# Patient Record
Sex: Male | Born: 1955 | Race: White | Hispanic: Yes | Marital: Single | Smoking: Former smoker
Health system: Southern US, Community
[De-identification: ages and names within clinical notes are randomized; demographics above are authoritative.]

## PROBLEM LIST (undated history)

## (undated) DIAGNOSIS — E669 Obesity, unspecified: Secondary | ICD-10-CM

## (undated) DIAGNOSIS — T7840XA Allergy, unspecified, initial encounter: Secondary | ICD-10-CM

## (undated) DIAGNOSIS — E1169 Type 2 diabetes mellitus with other specified complication: Secondary | ICD-10-CM

## (undated) HISTORY — DX: Allergy, unspecified, initial encounter: T78.40XA

## (undated) HISTORY — PX: FRACTURE SURGERY: SHX138

---

## 2004-08-29 ENCOUNTER — Emergency Department (HOSPITAL_COMMUNITY): Admission: EM | Admit: 2004-08-29 | Discharge: 2004-08-29 | Payer: Self-pay | Admitting: Emergency Medicine

## 2007-12-22 ENCOUNTER — Ambulatory Visit: Payer: Self-pay | Admitting: Nurse Practitioner

## 2007-12-22 DIAGNOSIS — R030 Elevated blood-pressure reading, without diagnosis of hypertension: Secondary | ICD-10-CM

## 2007-12-22 DIAGNOSIS — J309 Allergic rhinitis, unspecified: Secondary | ICD-10-CM | POA: Insufficient documentation

## 2007-12-22 LAB — CONVERTED CEMR LAB
Bilirubin Urine: NEGATIVE
Glucose, Urine, Semiquant: NEGATIVE
Protein, U semiquant: NEGATIVE
pH: 6

## 2007-12-27 ENCOUNTER — Encounter (INDEPENDENT_AMBULATORY_CARE_PROVIDER_SITE_OTHER): Payer: Self-pay | Admitting: Nurse Practitioner

## 2007-12-27 DIAGNOSIS — E7800 Pure hypercholesterolemia, unspecified: Secondary | ICD-10-CM | POA: Insufficient documentation

## 2007-12-27 LAB — CONVERTED CEMR LAB
Albumin: 4.6 g/dL (ref 3.5–5.2)
CO2: 26 meq/L (ref 19–32)
Calcium: 9.5 mg/dL (ref 8.4–10.5)
Chloride: 99 meq/L (ref 96–112)
Cholesterol: 281 mg/dL — ABNORMAL HIGH (ref 0–200)
Eosinophils Relative: 4 % (ref 0–5)
Glucose, Bld: 99 mg/dL (ref 70–99)
HCT: 48 % (ref 39.0–52.0)
Lymphocytes Relative: 46 % (ref 12–46)
Lymphs Abs: 2.3 10*3/uL (ref 0.7–4.0)
Microalb, Ur: 0.48 mg/dL (ref 0.00–1.89)
Neutrophils Relative %: 41 % — ABNORMAL LOW (ref 43–77)
Platelets: 265 10*3/uL (ref 150–400)
Potassium: 4.9 meq/L (ref 3.5–5.3)
Sodium: 141 meq/L (ref 135–145)
Total Protein: 7.4 g/dL (ref 6.0–8.3)
Triglycerides: 215 mg/dL — ABNORMAL HIGH (ref <150)
WBC: 5 10*3/uL (ref 4.0–10.5)

## 2008-01-06 ENCOUNTER — Ambulatory Visit: Payer: Self-pay | Admitting: Nurse Practitioner

## 2014-12-12 ENCOUNTER — Ambulatory Visit (INDEPENDENT_AMBULATORY_CARE_PROVIDER_SITE_OTHER): Payer: Self-pay | Admitting: Family Medicine

## 2014-12-12 VITALS — BP 120/76 | HR 65 | Temp 98.3°F | Resp 18 | Ht 66.0 in | Wt 205.4 lb

## 2014-12-12 DIAGNOSIS — R351 Nocturia: Secondary | ICD-10-CM

## 2014-12-12 DIAGNOSIS — R350 Frequency of micturition: Secondary | ICD-10-CM

## 2014-12-12 DIAGNOSIS — J069 Acute upper respiratory infection, unspecified: Secondary | ICD-10-CM

## 2014-12-12 DIAGNOSIS — B9789 Other viral agents as the cause of diseases classified elsewhere: Secondary | ICD-10-CM

## 2014-12-12 LAB — POC MICROSCOPIC URINALYSIS (UMFC): MUCUS RE: ABSENT

## 2014-12-12 LAB — POCT CBC
GRANULOCYTE PERCENT: 48.9 % (ref 37–80)
HEMATOCRIT: 43.9 % (ref 43.5–53.7)
Hemoglobin: 14.8 g/dL (ref 14.1–18.1)
Lymph, poc: 2.2 (ref 0.6–3.4)
MCH, POC: 30.5 pg (ref 27–31.2)
MCHC: 33.8 g/dL (ref 31.8–35.4)
MCV: 90.3 fL (ref 80–97)
MID (cbc): 0.4 (ref 0–0.9)
MPV: 7.2 fL (ref 0–99.8)
POC GRANULOCYTE: 2.5 (ref 2–6.9)
POC LYMPH %: 43.9 % (ref 10–50)
POC MID %: 7.2 % (ref 0–12)
Platelet Count, POC: 446 10*3/uL — AB (ref 142–424)
RBC: 4.86 M/uL (ref 4.69–6.13)
RDW, POC: 13.1 %
WBC: 5.1 10*3/uL (ref 4.6–10.2)

## 2014-12-12 LAB — POCT URINALYSIS DIP (MANUAL ENTRY)
BILIRUBIN UA: NEGATIVE
BILIRUBIN UA: NEGATIVE
Blood, UA: NEGATIVE
GLUCOSE UA: NEGATIVE
Leukocytes, UA: NEGATIVE
Nitrite, UA: NEGATIVE
Protein Ur, POC: NEGATIVE
SPEC GRAV UA: 1.01
Urobilinogen, UA: 0.2
pH, UA: 5

## 2014-12-12 LAB — GLUCOSE, POCT (MANUAL RESULT ENTRY): POC GLUCOSE: 119 mg/dL — AB (ref 70–99)

## 2014-12-12 NOTE — Progress Notes (Deleted)
Subjective:    Patient ID: Thomas Gill, male    DOB: 09-05-1955, 59 y.o.   MRN: [id number] This chart was scribed for Norberto SorensonEva Shaw, MD by Littie Deedsichard Sun, Medical Scribe. This patient was seen in Room 11 and the patient's care was started at 12:11 PM.   Chief Complaint  Patient presents with   Fever     100.6 last week     HPI HPI Comments: Thomas Gill is a 59 y.o. male who presents to the [facility] complaining of gradual onset fever of 100.6 F that started 8 days ago. Patient reports having associated nasal congestion and dry cough that developed yesterday. He has been eating, drinking, and sleeping without problems. He took a course of antibiotics (every 8 hours in the first 5 days) and has also been taking ibuprofen and acetaminophen. Overall, his symptoms have improved but he has some concern because he has been having symptoms for 8 days. Patient denies sore throat, chest pain, SOB, and wheezing. He admits to smoking 1 cigarette a day.  Patient also reports having some urinary frequency and nocturia that started 2 years ago, but worsened recently. He is concerned he may have some prostate problems.  Past Medical History  Diagnosis Date   Allergy    Past Surgical History  Procedure Laterality Date   Fracture surgery     No current outpatient prescriptions on file prior to visit.   No current facility-administered medications on file prior to visit.   No Known Allergies History reviewed. No pertinent family history. Social History   Social History   Marital Status: Single    Spouse Name: N/A   Number of Children: N/A   Years of Education: N/A   Social History Main Topics   Smoking status: Current Every Day Smoker -- 1.00 packs/day    Types: Cigarettes   Smokeless tobacco: None   Alcohol Use: No   Drug Use: No   Sexual Activity: Not Asked   Other Topics Concern   None   Social History Narrative   None    Review of Systems    Constitutional: Positive for fever. Negative for appetite change.  HENT: Negative for sore throat.   Respiratory: Positive for cough. Negative for shortness of breath and wheezing.   Cardiovascular: Negative for chest pain.  Genitourinary: Positive for frequency.  Psychiatric/Behavioral: Negative for sleep disturbance.       Objective:  BP 120/76 mmHg   Pulse 65   Temp(Src) 98.3 F (36.8 C) (Oral)   Resp 18   Ht 5\' 6"  (1.676 m)   Wt 205 lb 6.4 oz (93.169 kg)   BMI 33.17 kg/m2   SpO2 97%  Physical Exam  Constitutional: He is oriented to person, place, and time. He appears well-developed and well-nourished. No distress.  HENT:  Head: Normocephalic and atraumatic.  Right Ear: Tympanic membrane is injected and erythematous. A middle ear effusion is present.  Left Ear: Tympanic membrane is injected and erythematous. A middle ear effusion is present.  Nose: Mucosal edema and rhinorrhea present.  Mouth/Throat: Oropharynx is clear and moist. No oropharyngeal exudate, posterior oropharyngeal edema or posterior oropharyngeal erythema.  Nasal erythema, edema, and rhinorrhea.  Eyes: Pupils are equal, round, and reactive to light.  Neck: Neck supple. No thyromegaly present.  Thyroid normal.  Cardiovascular: Normal rate, regular rhythm, S1 normal, S2 normal and normal heart sounds.   No murmur heard. Pulmonary/Chest: Effort normal.  Good air movement. Coarsening of breath sounds  and faint inspiratory wheezing in right lobe, clears with inspiration.  Musculoskeletal: He exhibits no edema.  Lymphadenopathy:    He has no cervical adenopathy.       Right cervical: No posterior cervical adenopathy present.      Left cervical: No posterior cervical adenopathy present.       Right: No supraclavicular adenopathy present.       Left: No supraclavicular adenopathy present.  Neurological: He is alert and oriented to person, place, and time. No cranial nerve deficit.  Skin: Skin is warm and dry. No rash  noted.  Psychiatric: He has a normal mood and affect. His behavior is normal.  Nursing note and vitals reviewed.  Results for orders placed or performed in visit on 12/12/14  POCT CBC  Result Value Ref Range   WBC 5.1 4.6 - 10.2 K/uL   Lymph, poc 2.2 0.6 - 3.4   POC LYMPH PERCENT 43.9 10 - 50 %L   MID (cbc) 0.4 0 - 0.9   POC MID % 7.2 0 - 12 %M   POC Granulocyte 2.5 2 - 6.9   Granulocyte percent 48.9 37 - 80 %G   RBC 4.86 4.69 - 6.13 M/uL   Hemoglobin 14.8 14.1 - 18.1 g/dL   HCT, POC 14.7 82.9 - 53.7 %   MCV 90.3 80 - 97 fL   MCH, POC 30.5 27 - 31.2 pg   MCHC 33.8 31.8 - 35.4 g/dL   RDW, POC 56.2 %   Platelet Count, POC 446 (A) 142 - 424 K/uL   MPV 7.2 0 - 99.8 fL  POCT urinalysis dipstick  Result Value Ref Range   Color, UA yellow yellow   Clarity, UA clear clear   Glucose, UA negative negative   Bilirubin, UA negative negative   Ketones, POC UA negative negative   Spec Grav, UA 1.010    Blood, UA negative negative   pH, UA 5.0    Protein Ur, POC negative negative   Urobilinogen, UA 0.2    Nitrite, UA Negative Negative   Leukocytes, UA Negative Negative  POCT Microscopic Urinalysis ([facility])  Result Value Ref Range   WBC,UR,HPF,POC None None WBC/hpf   RBC,UR,HPF,POC None None RBC/hpf   Bacteria None None, Too numerous to count   Mucus Absent Absent   Epithelial Cells, UR Per Microscopy None None, Too numerous to count cells/hpf  POCT glucose (manual entry)  Result Value Ref Range   POC Glucose 119 (A) 70 - 99 mg/dl       Assessment & Plan:   1. Nocturia   2. Urinary frequency -  i think this is due to bph - pt is reassured and declines a medication for this.  3. Viral URI with cough   Pt reassured that no signs of cystitis, prostatitis, nephrolithiasis, or DM.  He is recovering from URI but reassured that cough should not persist beyond 1-2 wks and so ok to cont to use cough suppressant such as mucinex DM or delsym during the day for the next wk.  Orders Placed  This Encounter  Procedures   PSA   POCT CBC   POCT urinalysis dipstick   POCT Microscopic Urinalysis ([facility])   POCT glucose (manual entry)    Meds ordered this encounter  Medications   acetaminophen (TYLENOL) 100 MG/ML solution    Sig: Take 10 mg/kg by mouth every 4 (four) hours as needed for fever.   ibuprofen (ADVIL,MOTRIN) 100 MG chewable tablet    Sig: Chew  by mouth every 8 (eight) hours as needed.   penicillin v potassium (VEETID) 500 MG tablet    Sig: Take 500 mg by mouth 4 (four) times daily.    I personally performed the services described in this documentation, which was scribed in my presence. The recorded information has been reviewed and considered, and addended by me as needed.  Norberto Sorenson, MD MPH   By signing my name below, I, Littie Deeds, attest that this documentation has been prepared under the direction and in the presence of Norberto Sorenson, MD.  Electronically Signed: Littie Deeds, Medical Scribe. 12/12/2014. 12:10 PM.

## 2014-12-12 NOTE — Patient Instructions (Signed)
You do not have diabetes.  You had a viral respiratory infection which you are now recovering from. You may continue to cough for another 2 weeks but you can try this cough syrup at night and use delsym or mucinex DM during the day. No bladder infection. Hiperplasia prosttica benigna  (Benign Prostatic Hypertrophy) La glndula prosttica forma parte del sistema reproductor masculino. Una prstata normal tiene aproximadamente el tamao de [place] nuez. Produce un lquido que se mezcla con el esperma para formar el semen. Esta glndula rodea la uretra y se [place]ub[place] en frente del recto y por debajo de la vejiga. La vejiga es Immunologistel lugar en el que se acumula la Englevaleorina. La uretra es el conducto que lleva la orina desde la vejiga hacia el exterior del cuerpo. La prstata crece a medida que el hombre envejece. Cuando la prstata se agranda por otras causas que no sean cncer, producen un trastorno llamado hipertrofia prosttica benigna (HPB). Una prstata agrandada puede presionar la uretra. Esto puede dificultar el pasaje de la orina. En las primeras etapas del agrandamiento, la vejiga puede adaptarse al fortalecer los msculos para impulsar la orina en la uretra ms estrecha. Si el problema contina o Harrington Challengerempeora, requerir tratamiento mdico o quirrgico.  Esta afeccin debe ser controlada por su mdico. La acumulacin de orina en la vejiga puede causar una infeccin. La presin y la infeccin pueden llevar a un dao en la vejiga y a una insuficiencia en los riones (renal). Si es necesario, el mdico podr derivarlo a Music therapistun especialista en enfermedades del rin y de la prstata (urlogo). CAUSAS  La hiperplasia prosttica benigna es un problema en hombres mayores de 50 aos. Esta afeccin es una parte normal del envejecimiento. Sin embargo, no todos los Advertising copywriterhombres desarrollarn problemas por esta afeccin. Si el agrandamiento va ms all de la uretra, no habr compresin de la uretra ni resistencia al flujo de la orina. Si el  agrandamiento es Parker Hannifinhacia la uretra y la comprime, experimentar dificultad para Geographical information systems officerorinar.  SNTOMAS   Dificultad para vaciar completamente la vejiga.  Levantarse con frecuencia durante la noche para orinar.  Necesidad de Geographical information systems officerorinar con ms frecuencia Administratordurante el da.  Dificultad para comenzar a eliminar la orina.  Disminucin del tamao y la fuerza del chorro de [place].  Goteo al terminar de Geographical information systems officerorinar.  Dolor al orinar (ms frecuente en caso de infeccin).  Imposibilidad para orinar. En este caso es necesario un tratamiento inmediato.  Desarrollo de una infeccin en el tracto urinario. DIAGNSTICO  Estos estudios ayudarn al mdico a diagnosticar el problema.  Una exhaustiva historia clnica y examen fsico.  Historia de su forma de orinar, con el nmero de veces y la cantidad que orina, la fuerza del chorro de [place]or[place] y la sensacin de haber vaciado o no la vejiga despus de Geographical information systems officerorinar.  Un estudio despus de vaciar la vejiga que mide la cantidad de orina que queda en la vejiga despus de terminar de Geographical information systems officerorinar.  Examen rectal digital. En el examen rectal digital, el mdico controla la prstata colocando un dedo enguantado y lubricado en el recto para sentir la parte posterior de la prstata. En este estudio se detecta el tamao de la glndula y si hay bultos o crecimientos.  Anlisis de orina (urinlisis).  Estudio de antgeno prosttico especfico. Cleda Clarksste es un anlisis de sangre que se utiliza para Arts administratordiagnosticar el cncer de prstata.  Ecografa rectal. En este estudio se utilizan ondas de sonido para producir de Careers information officermanera electrnica una imagen de la  glndula prosttica. TRATAMIENTO  Una vez que los sntomas comienzan, el mdico controlar la afeccin. De los hombres que sufren esta afeccin, un tercio tendr sntomas que se estabilizan, un tercio tendr sntomas que mejoran y un tercio tendr sntomas que Neurosurgeon. Los sntomas leves pueden no necesitar tratamiento. Una simple  observacin y exmenes anuales pudiera ser todo lo que necesita. Los medicamentos y la [place] son opciones para los problemas ms graves. El mdico lo ayudar a tomar una decisin acerca de lo que resulte lo mejor para usted. Dos tipos de medicamentos estn disponibles para el alivio de los sntomas prostticos:  Hay medicamentos para IT trainer. Esto ayuda a [employer]. Estos medicamentos tardan en Scientist, water quality y pueden pasar meses antes de que se observe alguna mejora.  Los efectos adversos poco frecuentes incluyen problemas con la funcin sexual.  Medicamentos para relajar el msculo de la prstata. Esto tambin [employer] la obstruccin al reducir la compresin sobre la uretra. Este grupo de medicamentos funciona ms rpido que aquellos que reducen el tamao de la glndula prosttica. Generalmente se experimenta una mejora en [employer].  Los efectos adversos incluyen [place], [place], [place] varios tipos de tratamiento quirrgico disponibles para Paramedic los sntomas de la prstata:  Reseccin transuretral de la prstata (RTUP): En este tipo de tratamiento, se inserta un instrumento a travs de la abertura de la punta del pene. Se cortan trozos del Solicitor. Los trozos se retiran a Games developer de la misma abertura del pene. Esto mejora la obstruccin y [place] a [employer] sntomas.  Incisin transuretral (ITUP): En este procedimiento se hacen pequeos cortes en la prstata. sto hace que se alivie la presin de la prstata sobre la uretra.  Termoterapia transuretral con microondas (TTUM): En este procedimiento se utilizan microondas para crear calor. El calor destruye y extirpa una pequea cantidad de tejido prosttico.  Ablacin transuretral con aguja (ATUA): Este es un procedimiento en el que se utilizan frecuencias de radio para Radio producer lo mismo que en el TTUM.  Coagulacin intersticial con lser (CIL): En este procedimiento se utiliza un rayo lser  para hacer lo mismo que en el TTUM y el ATUA.  Electrovaporizacin transuretral (EVTU): En este procedimiento se utilizan electrodos para hacer los mismo que en los procedimientos enumerados anteriormente. SOLICITE ATENCIN MDICA SI:   Lance Muss.  Siente un dolor inexplicable en la espalda.  Los sntomas no se alivian con los medicamentos recetados.  Los [employer] causan [employer].  La orina se vuelve muy oscura o tiene mal olor.  Siente que la parte inferior del abdomen est distendida y tiene dificultad para Geographical information systems officer. SOLICITE ATENCIN MDICA DE INMEDIATO SI:   Repentinamente no puede orinar. Esto es Radio broadcast assistant. Debe ser atendido inmediatamente.  Observa gran cantidad de sangre o cogulos sanguneos en la orina.  No puede controlar sus problemas urinarios.  Se siente confundido, tiene mareos intensos o se desmaya.  Siente dolor moderado a intenso en la espalda o en un flanco.  Siente escalofros o le sube la fiebre.   Esta informacin no tiene Theme park manager el consejo del mdico. Asegrese de hacerle al mdico cualquier pregunta que tenga.   Document Released: 01/13/2005 Document Revised: 01/18/2013 Elsevier Interactive Patient Education [employer]

## 2014-12-13 LAB — PSA: PSA: 1.96 ng/mL (ref <=4.00)

## 2014-12-18 ENCOUNTER — Encounter: Payer: Self-pay | Admitting: Family Medicine

## 2015-01-01 NOTE — Progress Notes (Signed)
Subjective:    Patient ID: Thomas Gill, male    DOB: [birth year], 57 y.o.   MRN: [id number] This chart was scribed for Norberto SorensonEva Shaw, MD by Littie Deedsichard Sun, Medical Scribe. This patient was seen in Room 11 and the patient's care was started at 12:11 PM.   Chief Complaint  Patient presents with  . Fever     100.6 last week     Fever  Associated symptoms include coughing. Pertinent negatives include no chest pain, sore throat or wheezing.   HPI Comments: Thomas Gill is a 59 y.o. male who presents to the [facility] complaining of gradual onset fever of 100.6 F that started 8 days ago. Patient reports having associated nasal congestion and dry cough that developed yesterday. He has been eating, drinking, and sleeping without problems. He took a course of antibiotics (every 8 hours in the first 5 days) and has also been taking ibuprofen and acetaminophen. Overall, his symptoms have improved but he has some concern because he has been having symptoms for 8 days. Patient denies sore throat, chest pain, SOB, and wheezing. He admits to smoking 1 cigarette a day.  Patient also reports having some urinary frequency and nocturia that started 2 years ago, but worsened recently. He is concerned he may have some prostate problems.  Past Medical History  Diagnosis Date  . Allergy    Past Surgical History  Procedure Laterality Date  . Fracture surgery     No current outpatient prescriptions on file prior to visit.   No current facility-administered medications on file prior to visit.   No Known Allergies History reviewed. No pertinent family history. Social History   Social History  . Marital Status: Single    Spouse Name: N/A  . Number of Children: N/A  . Years of Education: N/A   Social History Main Topics  . Smoking status: Current Every Day Smoker -- 1.00 packs/day    Types: Cigarettes  . Smokeless tobacco: None  . Alcohol Use: No  . Drug Use: No  . Sexual Activity: Not  Asked   Other Topics Concern  . None   Social History Narrative  . None    Review of Systems  Constitutional: Positive for fever. Negative for appetite change.  HENT: Negative for sore throat.   Respiratory: Positive for cough. Negative for shortness of breath and wheezing.   Cardiovascular: Negative for chest pain.  Genitourinary: Positive for frequency.  Psychiatric/Behavioral: Negative for sleep disturbance.       Objective:  BP 120/76 mmHg  Pulse 65  Temp(Src) 98.3 F (36.8 C) (Oral)  Resp 18  Ht 5\' 6"  (1.676 m)  Wt 205 lb 6.4 oz (93.169 kg)  BMI 33.17 kg/m2  SpO2 97%  Physical Exam  Constitutional: He is oriented to person, place, and time. He appears well-developed and well-nourished. No distress.  HENT:  Head: Normocephalic and atraumatic.  Right Ear: Tympanic membrane is injected and erythematous. A middle ear effusion is present.  Left Ear: Tympanic membrane is injected and erythematous. A middle ear effusion is present.  Nose: Mucosal edema and rhinorrhea present.  Mouth/Throat: Oropharynx is clear and moist. No oropharyngeal exudate, posterior oropharyngeal edema or posterior oropharyngeal erythema.  Nasal erythema, edema, and rhinorrhea.  Eyes: Pupils are equal, round, and reactive to light.  Neck: Neck supple. No thyromegaly present.  Thyroid normal.  Cardiovascular: Normal rate, regular rhythm, S1 normal, S2 normal and normal heart sounds.   No murmur heard. Pulmonary/Chest: Effort  normal.  Good air movement. Coarsening of breath sounds and faint inspiratory wheezing in right lobe, clears with inspiration.  Musculoskeletal: He exhibits no edema.  Lymphadenopathy:    He has no cervical adenopathy.       Right cervical: No posterior cervical adenopathy present.      Left cervical: No posterior cervical adenopathy present.       Right: No supraclavicular adenopathy present.       Left: No supraclavicular adenopathy present.  Neurological: He is alert and  oriented to person, place, and time. No cranial nerve deficit.  Skin: Skin is warm and dry. No rash noted.  Psychiatric: He has a normal mood and affect. His behavior is normal.  Nursing note and vitals reviewed.  Results for orders placed or performed in visit on 12/12/14  PSA  Result Value Ref Range   PSA 1.96 <=4.00 ng/mL  POCT CBC  Result Value Ref Range   WBC 5.1 4.6 - 10.2 K/uL   Lymph, poc 2.2 0.6 - 3.4   POC LYMPH PERCENT 43.9 10 - 50 %L   MID (cbc) 0.4 0 - 0.9   POC MID % 7.2 0 - 12 %M   POC Granulocyte 2.5 2 - 6.9   Granulocyte percent 48.9 37 - 80 %G   RBC 4.86 4.69 - 6.13 M/uL   Hemoglobin 14.8 14.1 - 18.1 g/dL   HCT, POC 16.1 09.6 - 53.7 %   MCV 90.3 80 - 97 fL   MCH, POC 30.5 27 - 31.2 pg   MCHC 33.8 31.8 - 35.4 g/dL   RDW, POC 04.5 %   Platelet Count, POC 446 (A) 142 - 424 K/uL   MPV 7.2 0 - 99.8 fL  POCT urinalysis dipstick  Result Value Ref Range   Color, UA yellow yellow   Clarity, UA clear clear   Glucose, UA negative negative   Bilirubin, UA negative negative   Ketones, POC UA negative negative   Spec Grav, UA 1.010    Blood, UA negative negative   pH, UA 5.0    Protein Ur, POC negative negative   Urobilinogen, UA 0.2    Nitrite, UA Negative Negative   Leukocytes, UA Negative Negative  POCT Microscopic Urinalysis ([facility])  Result Value Ref Range   WBC,UR,HPF,POC None None WBC/hpf   RBC,UR,HPF,POC None None RBC/hpf   Bacteria None None, Too numerous to count   Mucus Absent Absent   Epithelial Cells, UR Per Microscopy None None, Too numerous to count cells/hpf  POCT glucose (manual entry)  Result Value Ref Range   POC Glucose 119 (A) 70 - 99 mg/dl       Assessment & Plan:   1. Nocturia   2. Urinary frequency -  i think this is due to bph - pt is reassured and declines a medication for this.  3. Viral URI with cough   Pt reassured that no signs of cystitis, prostatitis, nephrolithiasis, or DM.  He is recovering from URI but reassured that  cough should not persist beyond 1-2 wks and so ok to cont to use cough suppressant such as mucinex DM or delsym during the day for the next wk.  Orders Placed This Encounter  Procedures  . PSA  . POCT CBC  . POCT urinalysis dipstick  . POCT Microscopic Urinalysis ([facility])  . POCT glucose (manual entry)    Meds ordered this encounter  Medications  . acetaminophen (TYLENOL) 100 MG/ML solution    Sig: Take 10 mg/kg by  mouth every 4 (four) hours as needed for fever.  Marland Kitchen ibuprofen (ADVIL,MOTRIN) 100 MG chewable tablet    Sig: Chew by mouth every 8 (eight) hours as needed.  . penicillin v potassium (VEETID) 500 MG tablet    Sig: Take 500 mg by mouth 4 (four) times daily.    I personally performed the services described in this documentation, which was scribed in my presence. The recorded information has been reviewed and considered, and addended by me as needed.  Norberto Sorenson, MD MPH   By signing my name below, I, Littie Deeds, attest that this documentation has been prepared under the direction and in the presence of Norberto Sorenson, MD.  Electronically Signed: Littie Deeds, Medical Scribe. 01/01/2015. 2:55 PM.

## 2018-04-15 ENCOUNTER — Encounter: Payer: Self-pay | Admitting: Gastroenterology

## 2018-10-11 ENCOUNTER — Emergency Department (HOSPITAL_COMMUNITY): Payer: Medicaid Other

## 2018-10-11 ENCOUNTER — Encounter (HOSPITAL_COMMUNITY): Payer: Self-pay | Admitting: *Deleted

## 2018-10-11 ENCOUNTER — Inpatient Hospital Stay (HOSPITAL_COMMUNITY)
Admission: EM | Admit: 2018-10-11 | Discharge: 2018-10-28 | DRG: 207 | Disposition: E | Payer: Medicaid Other | Attending: Internal Medicine | Admitting: Internal Medicine

## 2018-10-11 ENCOUNTER — Other Ambulatory Visit: Payer: Self-pay

## 2018-10-11 DIAGNOSIS — N170 Acute kidney failure with tubular necrosis: Secondary | ICD-10-CM | POA: Diagnosis not present

## 2018-10-11 DIAGNOSIS — R6521 Severe sepsis with septic shock: Secondary | ICD-10-CM | POA: Diagnosis not present

## 2018-10-11 DIAGNOSIS — Z452 Encounter for adjustment and management of vascular access device: Secondary | ICD-10-CM

## 2018-10-11 DIAGNOSIS — R042 Hemoptysis: Secondary | ICD-10-CM | POA: Diagnosis not present

## 2018-10-11 DIAGNOSIS — J156 Pneumonia due to other aerobic Gram-negative bacteria: Secondary | ICD-10-CM | POA: Diagnosis not present

## 2018-10-11 DIAGNOSIS — I82443 Acute embolism and thrombosis of tibial vein, bilateral: Secondary | ICD-10-CM | POA: Diagnosis present

## 2018-10-11 DIAGNOSIS — I2699 Other pulmonary embolism without acute cor pulmonale: Secondary | ICD-10-CM | POA: Diagnosis not present

## 2018-10-11 DIAGNOSIS — I469 Cardiac arrest, cause unspecified: Secondary | ICD-10-CM

## 2018-10-11 DIAGNOSIS — I82403 Acute embolism and thrombosis of unspecified deep veins of lower extremity, bilateral: Secondary | ICD-10-CM | POA: Diagnosis not present

## 2018-10-11 DIAGNOSIS — I82431 Acute embolism and thrombosis of right popliteal vein: Secondary | ICD-10-CM | POA: Diagnosis present

## 2018-10-11 DIAGNOSIS — F17210 Nicotine dependence, cigarettes, uncomplicated: Secondary | ICD-10-CM | POA: Diagnosis present

## 2018-10-11 DIAGNOSIS — J939 Pneumothorax, unspecified: Secondary | ICD-10-CM | POA: Diagnosis not present

## 2018-10-11 DIAGNOSIS — Z66 Do not resuscitate: Secondary | ICD-10-CM | POA: Diagnosis not present

## 2018-10-11 DIAGNOSIS — A419 Sepsis, unspecified organism: Secondary | ICD-10-CM | POA: Diagnosis not present

## 2018-10-11 DIAGNOSIS — J9601 Acute respiratory failure with hypoxia: Secondary | ICD-10-CM | POA: Diagnosis present

## 2018-10-11 DIAGNOSIS — E871 Hypo-osmolality and hyponatremia: Secondary | ICD-10-CM | POA: Diagnosis present

## 2018-10-11 DIAGNOSIS — K7200 Acute and subacute hepatic failure without coma: Secondary | ICD-10-CM | POA: Diagnosis not present

## 2018-10-11 DIAGNOSIS — Z9889 Other specified postprocedural states: Secondary | ICD-10-CM

## 2018-10-11 DIAGNOSIS — E8770 Fluid overload, unspecified: Secondary | ICD-10-CM | POA: Diagnosis not present

## 2018-10-11 DIAGNOSIS — E875 Hyperkalemia: Secondary | ICD-10-CM | POA: Diagnosis not present

## 2018-10-11 DIAGNOSIS — Z9289 Personal history of other medical treatment: Secondary | ICD-10-CM

## 2018-10-11 DIAGNOSIS — J1289 Other viral pneumonia: Secondary | ICD-10-CM | POA: Diagnosis present

## 2018-10-11 DIAGNOSIS — G931 Anoxic brain damage, not elsewhere classified: Secondary | ICD-10-CM | POA: Diagnosis not present

## 2018-10-11 DIAGNOSIS — Z515 Encounter for palliative care: Secondary | ICD-10-CM | POA: Diagnosis not present

## 2018-10-11 DIAGNOSIS — J9811 Atelectasis: Secondary | ICD-10-CM

## 2018-10-11 DIAGNOSIS — J15211 Pneumonia due to Methicillin susceptible Staphylococcus aureus: Secondary | ICD-10-CM | POA: Diagnosis not present

## 2018-10-11 DIAGNOSIS — Z6838 Body mass index (BMI) 38.0-38.9, adult: Secondary | ICD-10-CM

## 2018-10-11 DIAGNOSIS — R680 Hypothermia, not associated with low environmental temperature: Secondary | ICD-10-CM | POA: Diagnosis not present

## 2018-10-11 DIAGNOSIS — Z01818 Encounter for other preprocedural examination: Secondary | ICD-10-CM

## 2018-10-11 DIAGNOSIS — E1165 Type 2 diabetes mellitus with hyperglycemia: Secondary | ICD-10-CM | POA: Diagnosis not present

## 2018-10-11 DIAGNOSIS — R34 Anuria and oliguria: Secondary | ICD-10-CM | POA: Diagnosis not present

## 2018-10-11 DIAGNOSIS — T17590A Other foreign object in bronchus causing asphyxiation, initial encounter: Secondary | ICD-10-CM | POA: Diagnosis not present

## 2018-10-11 DIAGNOSIS — X58XXXA Exposure to other specified factors, initial encounter: Secondary | ICD-10-CM | POA: Diagnosis not present

## 2018-10-11 DIAGNOSIS — E872 Acidosis: Secondary | ICD-10-CM | POA: Diagnosis not present

## 2018-10-11 DIAGNOSIS — Y95 Nosocomial condition: Secondary | ICD-10-CM | POA: Diagnosis not present

## 2018-10-11 DIAGNOSIS — I4891 Unspecified atrial fibrillation: Secondary | ICD-10-CM | POA: Diagnosis not present

## 2018-10-11 DIAGNOSIS — U071 COVID-19: Secondary | ICD-10-CM | POA: Diagnosis present

## 2018-10-11 DIAGNOSIS — G9340 Encephalopathy, unspecified: Secondary | ICD-10-CM | POA: Diagnosis not present

## 2018-10-11 DIAGNOSIS — Z791 Long term (current) use of non-steroidal anti-inflammatories (NSAID): Secondary | ICD-10-CM

## 2018-10-11 DIAGNOSIS — Z938 Other artificial opening status: Secondary | ICD-10-CM

## 2018-10-11 DIAGNOSIS — J9600 Acute respiratory failure, unspecified whether with hypoxia or hypercapnia: Secondary | ICD-10-CM

## 2018-10-11 DIAGNOSIS — J930 Spontaneous tension pneumothorax: Secondary | ICD-10-CM | POA: Diagnosis not present

## 2018-10-11 DIAGNOSIS — I82453 Acute embolism and thrombosis of peroneal vein, bilateral: Secondary | ICD-10-CM | POA: Diagnosis present

## 2018-10-11 DIAGNOSIS — J9312 Secondary spontaneous pneumothorax: Secondary | ICD-10-CM | POA: Diagnosis not present

## 2018-10-11 DIAGNOSIS — N179 Acute kidney failure, unspecified: Secondary | ICD-10-CM

## 2018-10-11 DIAGNOSIS — Z79899 Other long term (current) drug therapy: Secondary | ICD-10-CM

## 2018-10-11 DIAGNOSIS — E669 Obesity, unspecified: Secondary | ICD-10-CM | POA: Diagnosis present

## 2018-10-11 DIAGNOSIS — G4733 Obstructive sleep apnea (adult) (pediatric): Secondary | ICD-10-CM | POA: Diagnosis present

## 2018-10-11 DIAGNOSIS — Z7984 Long term (current) use of oral hypoglycemic drugs: Secondary | ICD-10-CM

## 2018-10-11 DIAGNOSIS — J80 Acute respiratory distress syndrome: Secondary | ICD-10-CM | POA: Diagnosis not present

## 2018-10-11 DIAGNOSIS — J9382 Other air leak: Secondary | ICD-10-CM | POA: Diagnosis not present

## 2018-10-11 HISTORY — DX: Obesity, unspecified: E66.9

## 2018-10-11 HISTORY — DX: Type 2 diabetes mellitus with other specified complication: E11.69

## 2018-10-11 LAB — COMPREHENSIVE METABOLIC PANEL
ALT: 65 U/L — ABNORMAL HIGH (ref 0–44)
AST: 64 U/L — ABNORMAL HIGH (ref 15–41)
Albumin: 2.3 g/dL — ABNORMAL LOW (ref 3.5–5.0)
Alkaline Phosphatase: 91 U/L (ref 38–126)
Anion gap: 19 — ABNORMAL HIGH (ref 5–15)
BUN: 36 mg/dL — ABNORMAL HIGH (ref 6–20)
CO2: 22 mmol/L (ref 22–32)
Calcium: 8.2 mg/dL — ABNORMAL LOW (ref 8.9–10.3)
Chloride: 90 mmol/L — ABNORMAL LOW (ref 98–111)
Creatinine, Ser: 1.29 mg/dL — ABNORMAL HIGH (ref 0.61–1.24)
GFR calc Af Amer: >60 mL/min (ref >60)
GFR calc non Af Amer: 60 mL/min — ABNORMAL LOW (ref >60)
Glucose, Bld: 285 mg/dL — ABNORMAL HIGH (ref 70–99)
Potassium: 4.1 mmol/L (ref 3.5–5.1)
Sodium: 131 mmol/L — ABNORMAL LOW (ref 135–145)
Total Bilirubin: 1 mg/dL (ref 0.3–1.2)
Total Protein: 6.9 g/dL (ref 6.5–8.1)

## 2018-10-11 LAB — CBC WITH DIFFERENTIAL/PLATELET
Abs Immature Granulocytes: 0 10*3/uL (ref 0.00–0.07)
Band Neutrophils: 1 %
Basophils Absolute: 0 10*3/uL (ref 0.0–0.1)
Basophils Relative: 0 %
Eosinophils Absolute: 0 10*3/uL (ref 0.0–0.5)
Eosinophils Relative: 0 %
HCT: 44.1 % (ref 39.0–52.0)
Hemoglobin: 14.9 g/dL (ref 13.0–17.0)
Lymphocytes Relative: 4 %
Lymphs Abs: 0.3 10*3/uL — ABNORMAL LOW (ref 0.7–4.0)
MCH: 30.8 pg (ref 26.0–34.0)
MCHC: 33.8 g/dL (ref 30.0–36.0)
MCV: 91.3 fL (ref 80.0–100.0)
Monocytes Absolute: 0 10*3/uL — ABNORMAL LOW (ref 0.1–1.0)
Monocytes Relative: 0 %
Neutro Abs: 8.3 10*3/uL — ABNORMAL HIGH (ref 1.7–7.7)
Neutrophils Relative %: 95 %
Platelets: 389 10*3/uL (ref 150–400)
RBC: 4.83 MIL/uL (ref 4.22–5.81)
RDW: 13.1 % (ref 11.5–15.5)
WBC: 8.6 10*3/uL (ref 4.0–10.5)
nRBC: 0 /100 WBC
nRBC: 0.8 % — ABNORMAL HIGH (ref 0.0–0.2)

## 2018-10-11 LAB — CBC
HCT: 43 % (ref 39.0–52.0)
Hemoglobin: 14.4 g/dL (ref 13.0–17.0)
MCH: 30.8 pg (ref 26.0–34.0)
MCHC: 33.5 g/dL (ref 30.0–36.0)
MCV: 91.9 fL (ref 80.0–100.0)
Platelets: 341 10*3/uL (ref 150–400)
RBC: 4.68 MIL/uL (ref 4.22–5.81)
RDW: 13 % (ref 11.5–15.5)
WBC: 8.6 10*3/uL (ref 4.0–10.5)
nRBC: 0.9 % — ABNORMAL HIGH (ref 0.0–0.2)

## 2018-10-11 LAB — SARS CORONAVIRUS 2 BY RT PCR (HOSPITAL ORDER, PERFORMED IN ~~LOC~~ HOSPITAL LAB): SARS Coronavirus 2: POSITIVE — AB

## 2018-10-11 LAB — TRIGLYCERIDES: Triglycerides: 187 mg/dL — ABNORMAL HIGH (ref <150)

## 2018-10-11 LAB — LACTIC ACID, PLASMA
Lactic Acid, Venous: 2.8 mmol/L (ref 0.5–1.9)
Lactic Acid, Venous: 2.1 mmol/L (ref 0.5–1.9)

## 2018-10-11 LAB — GLUCOSE, CAPILLARY: Glucose-Capillary: 334 mg/dL — ABNORMAL HIGH (ref 70–99)

## 2018-10-11 LAB — CREATININE, SERUM
Creatinine, Ser: 1.07 mg/dL (ref 0.61–1.24)
GFR calc Af Amer: >60 mL/min (ref >60)
GFR calc non Af Amer: >60 mL/min (ref >60)

## 2018-10-11 LAB — PROCALCITONIN: Procalcitonin: 0.19 ng/mL

## 2018-10-11 LAB — FERRITIN: Ferritin: 370 ng/mL — ABNORMAL HIGH (ref 24–336)

## 2018-10-11 LAB — LACTATE DEHYDROGENASE: LDH: 576 U/L — ABNORMAL HIGH (ref 98–192)

## 2018-10-11 LAB — BRAIN NATRIURETIC PEPTIDE: B Natriuretic Peptide: 490.1 pg/mL — ABNORMAL HIGH (ref 0.0–100.0)

## 2018-10-11 LAB — FIBRINOGEN: Fibrinogen: >800 mg/dL — ABNORMAL HIGH (ref 210–475)

## 2018-10-11 LAB — C-REACTIVE PROTEIN: CRP: 30.3 mg/dL — ABNORMAL HIGH (ref <1.0)

## 2018-10-11 MED ORDER — INSULIN ASPART 100 UNIT/ML ~~LOC~~ SOLN
15.0000 [IU] | Freq: Once | SUBCUTANEOUS | Status: AC
  Administered 2018-10-11: 15 [IU] via SUBCUTANEOUS

## 2018-10-11 MED ORDER — CHLORHEXIDINE GLUCONATE CLOTH 2 % EX PADS
6.0000 | MEDICATED_PAD | Freq: Every day | CUTANEOUS | Status: DC
  Administered 2018-10-11 – 2018-10-25 (×14): 6 via TOPICAL

## 2018-10-11 MED ORDER — INSULIN ASPART 100 UNIT/ML ~~LOC~~ SOLN
3.0000 [IU] | SUBCUTANEOUS | Status: DC
  Administered 2018-10-12 (×2): 9 [IU] via SUBCUTANEOUS

## 2018-10-11 MED ORDER — DEXAMETHASONE SODIUM PHOSPHATE 10 MG/ML IJ SOLN
10.0000 mg | Freq: Once | INTRAMUSCULAR | Status: AC
  Administered 2018-10-11: 10 mg via INTRAVENOUS
  Filled 2018-10-11: qty 1

## 2018-10-11 MED ORDER — DEXAMETHASONE SODIUM PHOSPHATE 10 MG/ML IJ SOLN: 6.0000 mg | Freq: Every day | INTRAMUSCULAR | Status: DC

## 2018-10-11 MED ORDER — SODIUM CHLORIDE 0.9 % IV SOLN
200.0000 mg | Freq: Once | INTRAVENOUS | Status: AC
  Administered 2018-10-11: 200 mg via INTRAVENOUS
  Filled 2018-10-11: qty 40

## 2018-10-11 MED ORDER — SODIUM CHLORIDE 0.9 % IV SOLN
100.0000 mg | INTRAVENOUS | Status: AC
  Administered 2018-10-12 – 2018-10-15 (×4): 100 mg via INTRAVENOUS
  Filled 2018-10-11 (×4): qty 20

## 2018-10-11 MED ORDER — ENOXAPARIN SODIUM 40 MG/0.4ML ~~LOC~~ SOLN: 40.0000 mg | SUBCUTANEOUS | Status: DC

## 2018-10-11 MED ORDER — DEXAMETHASONE SODIUM PHOSPHATE 10 MG/ML IJ SOLN
6.0000 mg | Freq: Two times a day (BID) | INTRAMUSCULAR | Status: DC
  Administered 2018-10-11 – 2018-10-22 (×22): 6 mg via INTRAVENOUS
  Filled 2018-10-11 (×22): qty 1

## 2018-10-11 MED ORDER — FAMOTIDINE 20 MG PO TABS
20.0000 mg | ORAL_TABLET | Freq: Two times a day (BID) | ORAL | Status: DC
  Administered 2018-10-11 – 2018-10-15 (×8): 20 mg via ORAL
  Filled 2018-10-11 (×7): qty 1

## 2018-10-11 MED ORDER — ENOXAPARIN SODIUM 60 MG/0.6ML ~~LOC~~ SOLN
45.0000 mg | Freq: Two times a day (BID) | SUBCUTANEOUS | Status: DC
  Administered 2018-10-12 – 2018-10-15 (×6): 45 mg via SUBCUTANEOUS
  Filled 2018-10-11 (×6): qty 0.6

## 2018-10-11 NOTE — Progress Notes (Signed)
Rt called d/t pt desat to mid 70s.  RT added NRB to 15LPM HFNC at this time.  Pt was asked to turn as far on his side as tolerated to mimic proning.  RN states pt complained of proning on belly d/t increased BP. RT placed pillows to encourage side proning as much as possible as well as discussing with RN importance of pt staying on side or back throughout the night. Pt has increased O2 needs and pressure needs from HFNC.  Pt is coughing up and suctioning sputum from self.  Pt appears tired and his WOB has increased.  RT will continue to monitor.

## 2018-10-11 NOTE — Progress Notes (Signed)
Contacted patient's brother, Shanon Brow and notified him that patient was transferred to [place]. He asked that we call him of any changes in patient's condition and for updates.

## 2018-10-11 NOTE — H&P (Signed)
NAME:  Thomas Gill, MRN:  [id number], DOB:  1955-08-21, LOS: 0 ADMISSION DATE:  04-10-18, CONSULTATION DATE:  2018/07/15 REFERRING MD:  [place] ConstableStienl- ED, CHIEF COMPLAINT:  hypoxic   Brief History   COVID, hypoxic Spanish-speaking only History of present illness   Mr. Thomas FosterReyna is a 63 y/o gentleman diagnosed with covid last week. When there health department called him to follow up they referred him to the ED and sent an ambulance by his house. He had had a cough and diarrhea, but denies SOB. When EMS arrived to his house he was saturating in the 60s on RA. In the ED he was saturating in the 70s, but responded to prone ventilation.   Past Medical History  Former tobacco abuse DM  Significant Hospital Events   Dexamethasone given in the ED  Consults:  PCCM  Procedures:    Significant Diagnostic Tests:  COVID positive  Micro Data:  covid pos  Antimicrobials:  none   Interim history/subjective:    Objective   Blood pressure 111/78, pulse 96, temperature 99.6 F (37.6 C), temperature source Oral, resp. rate (!) 39, height 5\' 3"  (1.6 m), weight 93 kg, SpO2 (!) 80 %.       No intake or output data in the 24 hours ending 2018/07/15 1306 Filed Weights   2018/07/15 1207  Weight: 93 kg    Examination: General: chronically-ill appearing man in NAD, proned and awake HENT: [place]/AT, eyes anicteric Lungs: rhales bilaterally, no rhonchi. Saturating 93% on 15L [place] and NRB. Previously in the low 70s when supine. Tachypneic, but no accessory muscle use Cardiovascular: NSR on telemetry Abdomen: obese, soft Extremities: no edema or cyanosis Neuro: awake, answering questions appropriately in spanish via interpreter, moving all extremities spontaneously GU: deferred Derm: no wounds or ecchymoses  Resolved Hospital Problem list     Assessment & Plan:  COVID 19 viral pneumonia causing acute hypoxic respiratory failure -remdesivir -dexamethasone 6mg  daily; previously received 10mg  today in the  ED -prone ventilation -supplemental O2 as required to maintain SpO2 >88%  DM type 2 on oral hypoglycemics -holding oral meds -anticipate on steroids he may require significantly more insulin, possibly an infusion -accuchecks Q4h -goal BG 140-180 while in ICU  Discussed case with accepting physician, [facility].  Best practice:  Diet: NPO Pain/Anxiety/Delirium protocol (if indicated): n/a VAP protocol (if indicated): n/a DVT prophylaxis: lovenox GI prophylaxis: pepcid Glucose control: SSI Mobility: bedrest Code Status: full Family Communication:  Disposition: ICU  Labs   CBC: Recent Labs  Lab 2018/07/15 1126  WBC 8.6  NEUTROABS 8.3*  HGB 14.9  HCT 44.1  MCV 91.3  PLT 389    Basic Metabolic Panel: No results for input(s): NA, K, CL, CO2, GLUCOSE, BUN, CREATININE, CALCIUM, MG, PHOS in the last 168 hours. GFR: CrCl cannot be calculated (Patient's most recent lab result is older than the maximum 21 days allowed.). Recent Labs  Lab 2018/07/15 1126 2018/07/15 1135  WBC 8.6  --   LATICACIDVEN  --  2.8*    Liver Function Tests: No results for input(s): AST, ALT, ALKPHOS, BILITOT, PROT, ALBUMIN in the last 168 hours. No results for input(s): LIPASE, AMYLASE in the last 168 hours. No results for input(s): AMMONIA in the last 168 hours.  ABG No results found for: PHART, PCO2ART, PO2ART, HCO3, TCO2, ACIDBASEDEF, O2SAT   Coagulation Profile: No results for input(s): INR, PROTIME in the last 168 hours.  Cardiac Enzymes: No results for input(s): CKTOTAL, CKMB, CKMBINDEX, TROPONINI in the last  168 hours.  HbA1C: No results found for: HGBA1C  CBG: No results for input(s): GLUCAP in the last 168 hours.  Review of Systems:   ROS: Positive for cough, fever, and diarrhea. All other ROS negative.  Past Medical History  He,  has a past medical history of Allergy.   Surgical History    Past Surgical History:  Procedure Laterality Date  . FRACTURE SURGERY        Social History   reports that he has been smoking cigarettes. He has been smoking about 1.00 pack per day. He has never used smokeless tobacco. He reports that he does not drink alcohol or use drugs.   Family History   His family history is not on file.   Allergies No Known Allergies   Home Medications  Prior to Admission medications   Medication Sig Start Date End Date Taking? Authorizing Provider  acetaminophen (TYLENOL) 100 MG/ML solution Take 10 mg/kg by mouth every 4 (four) hours as needed for fever.    [provider]  ibuprofen (ADVIL,MOTRIN) 100 MG chewable tablet Chew by mouth every 8 (eight) hours as needed.    [provider]  penicillin v potassium (VEETID) 500 MG tablet Take 500 mg by mouth 4 (four) times daily.    [provider]     Critical care time: [address], DO 10-27-18 1:58 PM [facility]

## 2018-10-11 NOTE — Progress Notes (Signed)
Seen at [facility], looks great with proning.  Usual COVID pathway with remdesivir, proning, mobilization, and steroids.  Hopefully can wean to [place] by AM.  Erskine Emery

## 2018-10-11 NOTE — ED Notes (Signed)
CC in ED. Resp at bedside. Patient placed in prone position. Resp tx explained prone position  And importance to help his breathing . Used Corporate treasurer.

## 2018-10-11 NOTE — ED Triage Notes (Addendum)
Patient presents to ED via [facility] states he tested positive for covid several days ago , the health dept. Called to follow up with him today and they called 911 stating he was so sob. Upon arrival patient was on a NRB  With sats in the low 80's. Patient states he has been sob for the last 3 days. Patient states he lives alone, c/o cough. Denies pain resp rate is shallow and rapid. Skin w/d. Resp aware will assess patient.  Patient speaks in very chopped sentences.

## 2018-10-11 NOTE — Progress Notes (Signed)
Pt spo2 remains low 83-88%. HFNC increased to 15L in addition to 15L NRB. Pt placed in prone position with a pillow under his chest. Google interpreter used as [facility] interpreter not available. RT explained why he was proning and importance of doing it duration of stay in ED. Also informed him we were trying to avoid intubation. He stated he understood and that he was not SOB at this time

## 2018-10-11 NOTE — Progress Notes (Signed)
Patient arrived from [facility] ED via [place]. He is currently on 100% 30L. His O2 is 89% RR 26 HR 84, B/P 93/64.CCMD hs been notified and patient has been placed on the monitor. Patient's CHG bath has been completed.

## 2018-10-11 NOTE — Progress Notes (Signed)
RN stated pt reports feeling more SOB. Pt in no distress, slight increased WOB. Pt texting when RT in room. Pt states he feels SOB but language barrier might be an issue. This RT looked up "Do you feel SOB?" on google translator and he replied "NO". He stated he just wanted a drink of water. RT will closely monitor pt

## 2018-10-11 NOTE — ED Notes (Signed)
ED TO INPATIENT HANDOFF REPORT  ED Nurse Name and Phone [phone]#:[phone]  S Name/Age/Gender Thomas Gill 63 y.o. male Room/Bed: 023C/023C  Code Status   Code Status: Full Code  Home/SNF/Other Home Patient oriented to: self, place, time and situation Is this baseline? Yes   Triage Complete: Triage complete  Chief Complaint sob  Triage Note Patient presents to ED via [facility] states he tested positive for covid several days ago , the health dept. Called to follow up with him today and they called 911 stating he was so sob. Upon arrival patient was on a NRB  With sats in the low 80's. Patient states he has been sob for the last 3 days. Patient states he lives alone, c/o cough. Denies pain resp rate is shallow and rapid. Skin w/d. Resp aware will assess patient.  Patient speaks in very chopped sentences.    Allergies No Known Allergies  Level of Care/Admitting Diagnosis ED Disposition    ED Disposition Condition Comment   Admit  [facility] [[id number]]  Level of Care: ICU [6]  Covid Evaluation: Confirmed COVID Positive  Diagnosis: Pneumonia due to COVID-19 virus [[id number]][[phone]]  Admitting Physician: Steffanie DunnCLARK, LAURA P [[id number]]  Attending Physician: Steffanie DunnCLARK, LAURA P [[id number]]  Estimated length of stay: 3 - 4 days  Certification:: I certify this patient will need inpatient services for at least 2 midnights  PT Class (Do Not Modify): Inpatient [101]  PT Acc Code (Do Not Modify): Private [1]       B Medical/Surgery History Past Medical History:  Diagnosis Date  . Allergy    Past Surgical History:  Procedure Laterality Date  . FRACTURE SURGERY       A IV Location/Drains/Wounds Patient Lines/Drains/Airways Status   Active Line/Drains/Airways    Name:   Placement date:   Placement time:   Site:   Days:   Peripheral IV 2019-01-16 Left;Lateral Forearm   2019-01-16    1144    Forearm   less than 1   Peripheral IV 2019-01-16 Left Forearm   2019-01-16    1145     Forearm   less than 1          Intake/Output Last 24 hours No intake or output data in the 24 hours ending 2019-01-16 1331  Labs/Imaging Results for orders placed or performed during the hospital encounter of 2019-01-16 (from the past 48 hour(s))  CBC WITH DIFFERENTIAL     Status: Abnormal   Collection Time: 2019-01-16 11:26 AM  Result Value Ref Range   WBC 8.6 4.0 - 10.5 K/uL   RBC 4.83 4.22 - 5.81 MIL/uL   Hemoglobin 14.9 13.0 - 17.0 g/dL   HCT 78.444.1 69.639.0 - 29.552.0 %   MCV 91.3 80.0 - 100.0 fL   MCH 30.8 26.0 - 34.0 pg   MCHC 33.8 30.0 - 36.0 g/dL   RDW 28.413.1 13.211.5 - 44.015.5 %   Platelets 389 150 - 400 K/uL   nRBC 0.8 (H) 0.0 - 0.2 %   Neutrophils Relative % 95 %   Neutro Abs 8.3 (H) 1.7 - 7.7 K/uL   Band Neutrophils 1 %   Lymphocytes Relative 4 %   Lymphs Abs 0.3 (L) 0.7 - 4.0 K/uL   Monocytes Relative 0 %   Monocytes Absolute 0.0 (L) 0.1 - 1.0 K/uL   Eosinophils Relative 0 %   Eosinophils Absolute 0.0 0.0 - 0.5 K/uL   Basophils Relative 0 %   Basophils Absolute 0.0 0.0 - 0.1  K/uL   nRBC 0 0 /100 WBC   Abs Immature Granulocytes 0.00 0.00 - 0.07 K/uL   Polychromasia PRESENT     Comment: Performed at [facility], [place] [address], [place], [place] [address]  Comprehensive metabolic panel     Status: Abnormal   Collection Time: 10/19/2018 11:26 AM  Result Value Ref Range   Sodium 131 (L) 135 - 145 mmol/L   Potassium 4.1 3.5 - 5.1 mmol/L    Comment: SLIGHT HEMOLYSIS   Chloride 90 (L) 98 - 111 mmol/L   CO2 22 22 - 32 mmol/L   Glucose, Bld 285 (H) 70 - 99 mg/dL   BUN 36 (H) 6 - 20 mg/dL   Creatinine, Ser 1.29 (H) 0.61 - 1.24 mg/dL   Calcium 8.2 (L) 8.9 - 10.3 mg/dL   Total Protein 6.9 6.5 - 8.1 g/dL   Albumin 2.3 (L) 3.5 - 5.0 g/dL   AST 64 (H) 15 - 41 U/L   ALT 65 (H) 0 - 44 U/L   Alkaline Phosphatase 91 38 - 126 U/L   Total Bilirubin 1.0 0.3 - 1.2 mg/dL   GFR calc non Af Amer 60 (L) >60 mL/min   GFR calc Af Amer >60 >60 mL/min   Anion gap 19 (H) 5 - 15    Comment:  Performed at [facility], [place] [address] [place], [place] [address]  Lactate dehydrogenase     Status: Abnormal   Collection Time: 10/22/2018 11:26 AM  Result Value Ref Range   LDH 576 (H) 98 - 192 U/L    Comment: Performed at [place] [address] [place], [place] [address]  Ferritin     Status: Abnormal   Collection Time: 09/30/2018 11:26 AM  Result Value Ref Range   Ferritin 370 (H) 24 - 336 ng/mL    Comment: Performed at [facility], [place] [address], [place], [place] [address]  Triglycerides     Status: Abnormal   Collection Time: 10/21/2018 11:26 AM  Result Value Ref Range   Triglycerides 187 (H) <150 mg/dL    Comment: Performed at [place] [address] [place], [place] [address]  Fibrinogen     Status: Abnormal   Collection Time: 10/20/2018 11:26 AM  Result Value Ref Range   Fibrinogen >800 (H) 210 - 475 mg/dL    Comment: REPEATED TO VERIFY Performed at [place] [address], [place], [place] [address]   C-reactive protein     Status: Abnormal   Collection Time: 10/21/2018 11:26 AM  Result Value Ref Range   CRP 30.3 (H) <1.0 mg/dL    Comment: Performed at [place] [address], [place], [place] [address]  SARS Coronavirus 2 [facility] order, Performed in [facility] hospital lab) Nasopharyngeal Nasopharyngeal Swab     Status: Abnormal   Collection Time: 10/07/2018 11:30 AM   Specimen: Nasopharyngeal Swab  Result Value Ref Range   SARS Coronavirus 2 POSITIVE (A) NEGATIVE    Comment: RESULT CALLED TO, READ BACK BY AND VERIFIED WITH: RN H HARDY AT 1304 10/22/2018 BY L BENFIELD (NOTE) If result is NEGATIVE SARS-CoV-2 target nucleic acids are NOT DETECTED. The SARS-CoV-2 RNA is generally detectable in upper and lower  respiratory specimens during the acute phase of infection. The lowest  concentration of SARS-CoV-2 viral copies this assay can detect is 250  copies / mL. A negative result does not preclude SARS-CoV-2  infection  and should not be used as the sole basis for treatment or other  patient management decisions.  A negative result may occur with  improper specimen collection / handling, submission of specimen other  than nasopharyngeal swab, presence of viral mutation(s) within the  areas targeted by this assay, and inadequate number of viral copies  (<250 copies / mL). A negative result must be combined with clinical  observations, patient history, and epidemiological information. If result is POSITIVE SARS-CoV-2 target nucleic acids are DETEC TED. The SARS-CoV-2 RNA is generally detectable in upper and lower  respiratory specimens during the acute phase of infection.  Positive  results are indicative of active infection with SARS-CoV-2.  Clinical  correlation with patient history and other diagnostic information is  necessary to determine patient infection status.  Positive results do  not rule out bacterial infection or co-infection with other viruses. If result is PRESUMPTIVE POSTIVE SARS-CoV-2 nucleic acids MAY BE PRESENT.   A presumptive positive result was obtained on the submitted specimen  and confirmed on repeat testing.  While 2019 novel coronavirus  (SARS-CoV-2) nucleic acids may be present in the submitted sample  additional confirmatory testing may be necessary for epidemiological  and / or clinical management purposes  to differentiate between  SARS-CoV-2 and other Sarbecovirus currently known to infect humans.  If clinically indicated additional testing with an alternate test  methodology (LAB7 453) is advised. The SARS-CoV-2 RNA is generally  detectable in upper and lower respiratory specimens during the acute  phase of infection. The expected result is Negative. Fact Sheet for Patients:  BoilerBrush.com.cyhttps://www.fda.gov/media/136312/download Fact Sheet for Healthcare Providers: https://pope.com/https://www.fda.gov/media/136313/download This test is not yet approved or cleared by the [place]  FDA and has been authorized for detection and/or diagnosis of SARS-CoV-2 by FDA under an Emergency Use Authorization (EUA).  This EUA will remain in effect (meaning this test can be used) for the duration of the COVID-19 declaration under Section 564(b)(1) of the Act, 21 U.S.C. section 360bbb-3(b)(1), unless the authorization is terminated or revoked sooner. Performed at [facility], [address] [place], [place] [address]   Lactic acid, plasma     Status: Abnormal   Collection Time: 04/12/2018 11:35 AM  Result Value Ref Range   Lactic Acid, Venous 2.8 (HH) 0.5 - 1.9 mmol/L    Comment: CRITICAL RESULT CALLED TO, READ BACK BY AND VERIFIED WITH: K.Joshlynn Alfonzo,RN 1301 August 21, 2018 CLARK,S Performed at [facility], [address] [place], [place] [address]    Dg Chest Port 1 View  Result Date: August 21, 2018 CLINICAL DATA:  63 year old male with a history of shortness of breath EXAM: PORTABLE CHEST 1 VIEW COMPARISON:  None. FINDINGS: Cardiomediastinal silhouette borderline enlarged with low lung volumes. Diffuse reticulonodular opacities of the bilateral lungs. No pneumothorax. Overlying EKG leads. No displaced fracture. IMPRESSION: Low lung volumes with diffuse reticulonodular opacities of the mid and lower lungs, potentially combination of multifocal infection and/or edema. Borderline enlarged cardiac silhouette. Electronically Signed   By: Gilmer MorJaime  Wagner D.O.   On: 03/16/202020 12:27    Pending Labs Unresulted Labs (From admission, onward)    Start     Ordered   10/18/18 0500  Creatinine, serum  (enoxaparin (LOVENOX)    CrCl >/= 30 ml/min)  Weekly,   R    Comments: while on enoxaparin therapy    04/12/2018 1301   10/12/18 1301  CBC  Once-Timed,   R     04/12/2018 1301   10/12/18 1300  Basic metabolic  panel  Once-Timed,   R     10-23-2018 1301   10-23-18 1312  HIV antibody (Routine Testing)  Add-on,   AD     2018/10/23 1312   Oct 23, 2018 1302  Brain natriuretic peptide  Add-on,   AD      10/23/2018 1301   2018-10-23 1300  CBC  (enoxaparin (LOVENOX)    CrCl >/= 30 ml/min)  Once,   STAT    Comments: Baseline for enoxaparin therapy IF NOT ALREADY DRAWN.  Notify MD if PLT < 100 K.    October 23, 2018 1301   2018-10-23 1300  Creatinine, serum  (enoxaparin (LOVENOX)    CrCl >/= 30 ml/min)  Once,   STAT    Comments: Baseline for enoxaparin therapy IF NOT ALREADY DRAWN.    23-Oct-2018 1301   October 23, 2018 1126  Lactic acid, plasma  Now then every 2 hours,   STAT     10-23-2018 1126   2018-10-23 1126  Procalcitonin  ONCE - STAT,   STAT     Oct 23, 2018 1126          Vitals/Pain Today's Vitals   Oct 23, 2018 1257 10-23-18 1300 10/23/2018 1306 2018-10-23 1315  BP:  108/81  114/81  Pulse:  97 93 93  Resp:  (!) 44 (!) 36 (!) 38  Temp:      TempSrc:      SpO2: (!) 80% (!) 85% 90% 92%  Weight:      Height:      PainSc:        Isolation Precautions Airborne and Contact precautions  Medications Medications  enoxaparin (LOVENOX) injection 40 mg (has no administration in time range)  dexamethasone (DECADRON) injection 6 mg (has no administration in time range)  dexamethasone (DECADRON) injection 10 mg (10 mg Intravenous Given 10/23/2018 1146)    Mobility walks Low fall risk   Focused Assessments    R Recommendations: See Admitting Provider Note  Report given to:   Additional Notes:

## 2018-10-11 NOTE — ED Notes (Signed)
[facility] notified of transport.

## 2018-10-11 NOTE — ED Provider Notes (Signed)
[facility]ENC[facility] Provider Note   CSN: [id number] Arrival date & time: April 27, 2018  1113     History   Chief Complaint Chief Complaint  Patient presents with  . Shortness of Breath    HPI Thomas Gill is a 63 y.o. male.     Patient with recent covid+ test, c/o increased sob. Had test at health dept, during recheck call today pt was very sob, and was told to come to ED. Patient's cough/fever began approximately 9 days ago, acute onset, milder at onset, slowly worse. In past 1-2 days, pt with worsening non prod cough, and increased trouble breathing, moderate. Denies sore throat. +fevers. +loss of taste/smell. 1-2 loose stools, no severe diarrhea. No vomiting. No abd pain. Is eating/drinking. +smoker.   The history is provided by the patient.  Shortness of Breath Associated symptoms: cough and fever   Associated symptoms: no abdominal pain, no chest pain, no headaches, no neck pain, no rash, no sore throat and no vomiting     Past Medical History:  Diagnosis Date  . Allergy     Patient Active Problem List   Diagnosis Date Noted  . HYPERCHOLESTEROLEMIA 12/27/2007  . ALLERGIC RHINITIS 12/22/2007  . ELEVATED BLOOD PRESSURE 12/22/2007    Past Surgical History:  Procedure Laterality Date  . FRACTURE SURGERY          Home Medications    Prior to Admission medications   Medication Sig Start Date End Date Taking? Authorizing Provider  acetaminophen (TYLENOL) 100 MG/ML solution Take 10 mg/kg by mouth every 4 (four) hours as needed for fever.    [provider]  ibuprofen (ADVIL,MOTRIN) 100 MG chewable tablet Chew by mouth every 8 (eight) hours as needed.    [provider]  penicillin v potassium (VEETID) 500 MG tablet Take 500 mg by mouth 4 (four) times daily.    [provider]    Family History No family history on file.  Social History Social History   Tobacco Use  . Smoking status: Current Every Day  Smoker    Packs/day: 1.00    Types: Cigarettes  Substance Use Topics  . Alcohol use: No    Alcohol/week: 0.0 standard drinks  . Drug use: No     Allergies   Patient has no known allergies.   Review of Systems Review of Systems  Constitutional: Positive for fever.  HENT: Negative for sore throat.   Eyes: Negative for redness.  Respiratory: Positive for cough and shortness of breath.   Cardiovascular: Negative for chest pain and leg swelling.  Gastrointestinal: Positive for diarrhea. Negative for abdominal pain and vomiting.  Endocrine: Negative for polyuria.  Genitourinary: Negative for flank pain.  Musculoskeletal: Negative for back pain and neck pain.  Skin: Negative for rash.  Neurological: Negative for headaches.  Hematological: Does not bruise/bleed easily.  Psychiatric/Behavioral: Negative for confusion.     Physical Exam Updated Vital Signs BP 111/77   Pulse 99   Resp (!) 33   SpO2 (!) 84%   Physical Exam Vitals signs and nursing note reviewed.  Constitutional:      Appearance: Normal appearance. He is well-developed.  HENT:     Head: Atraumatic.     Nose: Nose normal.     Mouth/Throat:     Mouth: Mucous membranes are moist.     Pharynx: Oropharynx is clear.  Eyes:     General: No scleral icterus.    Conjunctiva/sclera: Conjunctivae normal.     Pupils:  Pupils are equal, round, and reactive to light.  Neck:     Musculoskeletal: Normal range of motion and neck supple. No neck rigidity.     Trachea: No tracheal deviation.  Cardiovascular:     Rate and Rhythm: Regular rhythm. Tachycardia present.     Pulses: Normal pulses.     Heart sounds: Normal heart sounds. No murmur. No friction rub. No gallop.   Pulmonary:     Effort: Respiratory distress present. No accessory muscle usage.     Breath sounds: Rhonchi present.  Abdominal:     General: Bowel sounds are normal. There is no distension.     Palpations: Abdomen is soft.     Tenderness: There is no  abdominal tenderness. There is no guarding.  Genitourinary:    Comments: No cva tenderness. Musculoskeletal:        General: No swelling or tenderness.     Right lower leg: No edema.     Left lower leg: No edema.  Skin:    General: Skin is warm and dry.     Findings: No rash.  Neurological:     Mental Status: He is alert.     Comments: Alert, speech clear.   Psychiatric:        Mood and Affect: Mood normal.      ED Treatments / Results  Labs (all labs ordered are listed, but only abnormal results are displayed) Results for orders placed or performed during the hospital encounter of 2018-10-20  SARS Coronavirus 2 [facility] order, Performed in [facility] hospital lab) Nasopharyngeal Nasopharyngeal Swab   Specimen: Nasopharyngeal Swab  Result Value Ref Range   SARS Coronavirus 2 POSITIVE (A) NEGATIVE  Lactic acid, plasma  Result Value Ref Range   Lactic Acid, Venous 2.8 (HH) 0.5 - 1.9 mmol/L  CBC WITH DIFFERENTIAL  Result Value Ref Range   WBC 8.6 4.0 - 10.5 K/uL   RBC 4.83 4.22 - 5.81 MIL/uL   Hemoglobin 14.9 13.0 - 17.0 g/dL   HCT 32.3 55.7 - 32.2 %   MCV 91.3 80.0 - 100.0 fL   MCH 30.8 26.0 - 34.0 pg   MCHC 33.8 30.0 - 36.0 g/dL   RDW 02.5 42.7 - 06.2 %   Platelets 389 150 - 400 K/uL   nRBC 0.8 (H) 0.0 - 0.2 %   Neutrophils Relative % 95 %   Neutro Abs 8.3 (H) 1.7 - 7.7 K/uL   Band Neutrophils 1 %   Lymphocytes Relative 4 %   Lymphs Abs 0.3 (L) 0.7 - 4.0 K/uL   Monocytes Relative 0 %   Monocytes Absolute 0.0 (L) 0.1 - 1.0 K/uL   Eosinophils Relative 0 %   Eosinophils Absolute 0.0 0.0 - 0.5 K/uL   Basophils Relative 0 %   Basophils Absolute 0.0 0.0 - 0.1 K/uL   nRBC 0 0 /100 WBC   Abs Immature Granulocytes 0.00 0.00 - 0.07 K/uL   Polychromasia PRESENT   Comprehensive metabolic panel  Result Value Ref Range   Sodium 131 (L) 135 - 145 mmol/L   Potassium 4.1 3.5 - 5.1 mmol/L   Chloride 90 (L) 98 - 111 mmol/L   CO2 22 22 - 32 mmol/L   Glucose, Bld 285 (H) 70 -  99 mg/dL   BUN 36 (H) 6 - 20 mg/dL   Creatinine, Ser 3.76 (H) 0.61 - 1.24 mg/dL   Calcium 8.2 (L) 8.9 - 10.3 mg/dL   Total Protein 6.9 6.5 - 8.1 g/dL   Albumin  2.3 (L) 3.5 - 5.0 g/dL   AST 64 (H) 15 - 41 U/L   ALT 65 (H) 0 - 44 U/L   Alkaline Phosphatase 91 38 - 126 U/L   Total Bilirubin 1.0 0.3 - 1.2 mg/dL   GFR calc non Af Amer 60 (L) >60 mL/min   GFR calc Af Amer >60 >60 mL/min   Anion gap 19 (H) 5 - 15  Lactate dehydrogenase  Result Value Ref Range   LDH 576 (H) 98 - 192 U/L  Ferritin  Result Value Ref Range   Ferritin 370 (H) 24 - 336 ng/mL  Triglycerides  Result Value Ref Range   Triglycerides 187 (H) <150 mg/dL  Fibrinogen  Result Value Ref Range   Fibrinogen >800 (H) 210 - 475 mg/dL  C-reactive protein  Result Value Ref Range   CRP 30.3 (H) <1.0 mg/dL    EKG EKG Interpretation  Date/Time:  Monday October 11 2018 11:41:26 EDT Ventricular Rate:  98 PR Interval:    QRS Duration: 99 QT Interval:  355 QTC Calculation: 454 R Axis:   14 Text Interpretation:  Sinus rhythm Nonspecific T wave abnormality No previous tracing Confirmed by Cathren LaineSteinl, Haniyyah Sakuma ([id number]) on 10/10/2018 12:10:22 PM   Radiology Dg Chest Port 1 View  Result Date: 10/01/2018 CLINICAL DATA:  63 year old male with a history of shortness of breath EXAM: PORTABLE CHEST 1 VIEW COMPARISON:  None. FINDINGS: Cardiomediastinal silhouette borderline enlarged with low lung volumes. Diffuse reticulonodular opacities of the bilateral lungs. No pneumothorax. Overlying EKG leads. No displaced fracture. IMPRESSION: Low lung volumes with diffuse reticulonodular opacities of the mid and lower lungs, potentially combination of multifocal infection and/or edema. Borderline enlarged cardiac silhouette. Electronically Signed   By: Gilmer MorJaime  Wagner D.O.   On: 10/19/2018 12:27    Procedures Procedures (including critical care time)  Medications Ordered in ED Medications  dexamethasone (DECADRON) injection 10 mg (has no  administration in time range)     Initial Impression / Assessment and Plan / ED Course  I have reviewed the triage vital signs and the nursing notes.  Pertinent labs & imaging results that were available during my care of the patient were reviewed by me and considered in my medical decision making (see chart for details).  Iv ns. Continuous pulse ox and monitor. Stat labs.  Reviewed nursing notes and prior charts for additional history.   covid test sent to confirm diagnosis.   On room air, pulse ox in 60's. 100% o2 mask, sats in low 90s.   Stat pcxr. Labs.   Decadron iv.   Thomas Gill was evaluated in Emergency Department on 10/21/2018 for the symptoms described in the history of present illness. He was evaluated in the context of the global COVID-19 pandemic, which necessitated consideration that the patient might be at risk for infection with the SARS-CoV-2 virus that causes COVID-19. Institutional protocols and algorithms that pertain to the evaluation of patients at risk for COVID-19 are in a state of rapid change based on information released by regulatory bodies including the CDC and federal and state organizations. These policies and algorithms were followed during the patient's care in the ED.  o2 titrated - resp therapy consulted.   Given degree of hypoxia/o2 requirement/resp status - ICU team consulted [facility].   CRITICAL CARE RE covid infection with acute resp failure, severe hypoxia.  Performed by: Suzi RootsKevin E Karey Stucki Total critical care time: 130 minutes Critical care time was exclusive of separately billable procedures and  treating other patients. Critical care was necessary to treat or prevent imminent or life-threatening deterioration. Critical care was time spent personally by me on the following activities: development of treatment plan with patient and/or surrogate as well as nursing, discussions with consultants, evaluation of patient's response  to treatment, examination of patient, obtaining history from patient or surrogate, ordering and performing treatments and interventions, ordering and review of laboratory studies, ordering and review of radiographic studies, pulse oximetry and re-evaluation of patient's condition.  Labs reviewed by me - aki on chem panel.   CXR reviewed by me - bil infil c/w covid infection.  Pulmonary/critical care to facilitate transfer to [facility].  Multiple rechecks of pt/serial assessments resp status.  Currently pulse ox 92% on 100% mask.  Awaiting transfer to [facility].   Final Clinical Impressions(s) / ED Diagnoses   Final diagnoses:  None    ED Discharge Orders    None       Lajean Saver, MD 10-27-2018 1348

## 2018-10-11 NOTE — ED Notes (Signed)
Resp at bedside.  Placed patient on 8 liters [place] along with the NRB>

## 2018-10-12 ENCOUNTER — Encounter (HOSPITAL_COMMUNITY): Payer: Self-pay

## 2018-10-12 DIAGNOSIS — J9601 Acute respiratory failure with hypoxia: Secondary | ICD-10-CM

## 2018-10-12 DIAGNOSIS — U071 COVID-19: Secondary | ICD-10-CM | POA: Diagnosis not present

## 2018-10-12 DIAGNOSIS — N179 Acute kidney failure, unspecified: Secondary | ICD-10-CM | POA: Diagnosis not present

## 2018-10-12 DIAGNOSIS — J1289 Other viral pneumonia: Secondary | ICD-10-CM | POA: Diagnosis not present

## 2018-10-12 LAB — BASIC METABOLIC PANEL
Anion gap: 12 (ref 5–15)
BUN: 30 mg/dL — ABNORMAL HIGH (ref 6–20)
CO2: 28 mmol/L (ref 22–32)
Calcium: 8.4 mg/dL — ABNORMAL LOW (ref 8.9–10.3)
Chloride: 93 mmol/L — ABNORMAL LOW (ref 98–111)
Creatinine, Ser: 0.81 mg/dL (ref 0.61–1.24)
GFR calc Af Amer: >60 mL/min (ref >60)
GFR calc non Af Amer: >60 mL/min (ref >60)
Glucose, Bld: 289 mg/dL — ABNORMAL HIGH (ref 70–99)
Potassium: 3.9 mmol/L (ref 3.5–5.1)
Sodium: 133 mmol/L — ABNORMAL LOW (ref 135–145)

## 2018-10-12 LAB — GLUCOSE, CAPILLARY
Glucose-Capillary: 222 mg/dL — ABNORMAL HIGH (ref 70–99)
Glucose-Capillary: 238 mg/dL — ABNORMAL HIGH (ref 70–99)
Glucose-Capillary: 246 mg/dL — ABNORMAL HIGH (ref 70–99)
Glucose-Capillary: 265 mg/dL — ABNORMAL HIGH (ref 70–99)
Glucose-Capillary: 270 mg/dL — ABNORMAL HIGH (ref 70–99)
Glucose-Capillary: 286 mg/dL — ABNORMAL HIGH (ref 70–99)

## 2018-10-12 LAB — HEPATIC FUNCTION PANEL
ALT: 60 U/L — ABNORMAL HIGH (ref 0–44)
AST: 65 U/L — ABNORMAL HIGH (ref 15–41)
Albumin: 2.4 g/dL — ABNORMAL LOW (ref 3.5–5.0)
Alkaline Phosphatase: 84 U/L (ref 38–126)
Bilirubin, Direct: 0.1 mg/dL (ref 0.0–0.2)
Indirect Bilirubin: 0.1 mg/dL — ABNORMAL LOW (ref 0.3–0.9)
Total Bilirubin: 0.2 mg/dL — ABNORMAL LOW (ref 0.3–1.2)
Total Protein: 6.5 g/dL (ref 6.5–8.1)

## 2018-10-12 LAB — CBC
HCT: 41.7 % (ref 39.0–52.0)
Hemoglobin: 13.7 g/dL (ref 13.0–17.0)
MCH: 30.4 pg (ref 26.0–34.0)
MCHC: 32.9 g/dL (ref 30.0–36.0)
MCV: 92.5 fL (ref 80.0–100.0)
Platelets: 251 10*3/uL (ref 150–400)
RBC: 4.51 MIL/uL (ref 4.22–5.81)
RDW: 12.7 % (ref 11.5–15.5)
WBC: 11.7 10*3/uL — ABNORMAL HIGH (ref 4.0–10.5)
nRBC: 1 % — ABNORMAL HIGH (ref 0.0–0.2)

## 2018-10-12 LAB — MRSA PCR SCREENING: MRSA by PCR: NEGATIVE

## 2018-10-12 LAB — HIV ANTIBODY (ROUTINE TESTING W REFLEX): HIV Screen 4th Generation wRfx: NONREACTIVE

## 2018-10-12 LAB — HEMOGLOBIN A1C
Hgb A1c MFr Bld: 9.1 % — ABNORMAL HIGH (ref 4.8–5.6)
Mean Plasma Glucose: 214 mg/dL

## 2018-10-12 MED ORDER — INSULIN ASPART 100 UNIT/ML ~~LOC~~ SOLN
0.0000 [IU] | Freq: Every day | SUBCUTANEOUS | Status: DC
  Administered 2018-10-12: 2 [IU] via SUBCUTANEOUS

## 2018-10-12 MED ORDER — INSULIN DETEMIR 100 UNIT/ML ~~LOC~~ SOLN
10.0000 [IU] | Freq: Two times a day (BID) | SUBCUTANEOUS | Status: DC
  Administered 2018-10-12 – 2018-10-17 (×12): 10 [IU] via SUBCUTANEOUS
  Filled 2018-10-12 (×13): qty 0.1

## 2018-10-12 MED ORDER — INSULIN ASPART 100 UNIT/ML ~~LOC~~ SOLN
0.0000 [IU] | Freq: Three times a day (TID) | SUBCUTANEOUS | Status: DC
  Administered 2018-10-12 (×2): 11 [IU] via SUBCUTANEOUS
  Administered 2018-10-12 – 2018-10-13 (×4): 7 [IU] via SUBCUTANEOUS

## 2018-10-12 MED ORDER — INSULIN ASPART 100 UNIT/ML ~~LOC~~ SOLN
6.0000 [IU] | Freq: Three times a day (TID) | SUBCUTANEOUS | Status: DC
  Administered 2018-10-12 – 2018-10-13 (×6): 6 [IU] via SUBCUTANEOUS

## 2018-10-12 MED ORDER — ORAL CARE MOUTH RINSE
15.0000 mL | Freq: Two times a day (BID) | OROMUCOSAL | Status: DC
  Administered 2018-10-12 – 2018-10-13 (×3): 15 mL via OROMUCOSAL

## 2018-10-12 NOTE — Progress Notes (Signed)
Pt O2 in 70's briefly with improvement with lateral to the left, O2 in 90's.  With NRB and HFNC on, patient O2 at best was 84%.  Proned patient and O2 improved back into 90's.

## 2018-10-12 NOTE — Progress Notes (Addendum)
Patient has wallet, cell phone, and clothing at bedside. Myself and Vonna Kotyk, RN counted the amount in the wallet, which was $412. Patient requests the wallet be left at bedside instead of locking it up in the safe.

## 2018-10-12 NOTE — Progress Notes (Signed)
Pt O2 66%.  Increased FiO2 to 100% 50L.  Increased O2 sat to 88%

## 2018-10-12 NOTE — Progress Notes (Signed)
Proned patient around 0800 and he has remained prone most of the shift with exception of meals.  O2 sat variable throughout shift.  Pt runs NSR/SB.  Adequate UO, using urinal.  BG remains elevated.  Discussed with attending during rounds about insulin coverage.  No acute distress.

## 2018-10-12 NOTE — Plan of Care (Signed)
  Problem: Education: Goal: Knowledge of risk factors and measures for prevention of condition will improve Outcome: Progressing   Problem: Coping: Goal: Psychosocial and spiritual needs will be supported Outcome: Progressing   Problem: Respiratory: Goal: Will maintain a patent airway Outcome: Progressing Goal: Complications related to the disease process, condition or treatment will be avoided or minimized Outcome: Progressing   Problem: Activity: Goal: Ability to tolerate increased activity will improve Outcome: Progressing   Problem: Clinical Measurements: Goal: Ability to maintain a body temperature in the normal range will improve Outcome: Progressing   Problem: Respiratory: Goal: Ability to maintain adequate ventilation will improve Outcome: Progressing Goal: Ability to maintain a clear airway will improve Outcome: Progressing   

## 2018-10-12 NOTE — Progress Notes (Signed)
[facility] PROGRESS NOTE    Progress Note  Thomas Gill  ZOX:[id number] DOB: April 24, 1955 DOA: 06-05-2018 PCP: Patient, No Pcp Per     Brief Narrative:   Thomas Gill is an 63 y.o. male diagnosed with COVID-19 last week test on 06-05-2018 was positive, presents to the ED with progressive shortness of breath, cough and diarrhea.  When EMS arrived at his house he was satting in the 60s on room air.  Assessment/Plan:   Acute respiratory failure with hypoxia due to COVID-19 viral pneumonia: He was started on IV Remdesivir and steroids. Patient is morning and saturations have improved greater 92% on heated high flow nasal cannula with an FiO2 of 100% on a flow rate of 50, he desatted overnight likely has obstructive sleep apnea. His inflammatory markers are significantly elevated.  Diabetes mellitus type 2: Continue to hold metformin. I anticipate a rise in his blood glucose with the introduction of steroids in the setting of COVID-19 on infection. Changing sliding scale to resistant add low-dose long-acting insulin.    DVT prophylaxis: lovenox Family Communication:none Disposition Plan/Barrier to D/C: once off oxygen Code Status:     Code Status Orders  (From admission, onward)         Start     Ordered   13-Aug-2018 1300  Full code  Continuous     13-Aug-2018 1301        Code Status History    This patient has a current code status but no historical code status.   Advance Care Planning Activity        IV Access:    Peripheral IV   Procedures and diagnostic studies:   Dg Chest Port 1 View  Result Date: 06-05-2018 CLINICAL DATA:  63 year old male with a history of shortness of breath EXAM: PORTABLE CHEST 1 VIEW COMPARISON:  None. FINDINGS: Cardiomediastinal silhouette borderline enlarged with low lung volumes. Diffuse reticulonodular opacities of the bilateral lungs. No pneumothorax. Overlying EKG leads. No displaced fracture. IMPRESSION: Low lung volumes with  diffuse reticulonodular opacities of the mid and lower lungs, potentially combination of multifocal infection and/or edema. Borderline enlarged cardiac silhouette. Electronically Signed   By: Gilmer MorJaime  Wagner D.O.   On: 005-09-2018 12:27     Medical Consultants:    None.  Anti-Infectives:   None  Subjective:    Thomas Gill relate he did not have a good night sleep. Objective:    Vitals:   10/12/18 0300 10/12/18 0400 10/12/18 0500 10/12/18 0608  BP: 98/63 92/65 91/60  105/65  Pulse: 67 (!) 58 60 (!) 30  Resp: (!) 28 (!) 37 (!) 34   Temp:  98.9 F (37.2 C)    TempSrc:  Oral    SpO2: (!) 88% 90% (!) 84%   Weight:      Height:       SpO2: (!) 84 % O2 Flow Rate (L/min): 50 L/min FiO2 (%): 100 %   Intake/Output Summary (Last 24 hours) at 10/12/2018 0730 Last data filed at 10/12/2018 0600 Gross per 24 hour  Intake 550 ml  Output 1000 ml  Net -450 ml   Filed Weights   13-Aug-2018 1207  Weight: 93 kg    Exam: General exam: In no acute distress. Respiratory system: Good air movement and feels crackles bilaterally rhonchi's on the right. Cardiovascular system: S1 & S2 heard, RRR. No JVD. Gastrointestinal system: Abdomen is nondistended, soft and nontender.  Central nervous system: Alert and oriented. No focal neurological deficits. Extremities: No pedal edema. Skin: No  rashes, lesions or ulcers Psychiatry: Judgement and insight appear normal. Mood & affect appropriate.    Data Reviewed:    Labs: Basic Metabolic Panel: Recent Labs  Lab 10/26/2018 1126 10/23/2018 1415  NA 131*  --   K 4.1  --   CL 90*  --   CO2 22  --   GLUCOSE 285*  --   BUN 36*  --   CREATININE 1.29* 1.07  CALCIUM 8.2*  --    GFR Estimated Creatinine Clearance: 74 mL/min (by C-G formula based on SCr of 1.07 mg/dL). Liver Function Tests: Recent Labs  Lab 10/09/2018 1126  AST 64*  ALT 65*  ALKPHOS 91  BILITOT 1.0  PROT 6.9  ALBUMIN 2.3*   No results for input(s): LIPASE, AMYLASE in the  last 168 hours. No results for input(s): AMMONIA in the last 168 hours. Coagulation profile No results for input(s): INR, PROTIME in the last 168 hours. COVID-19 Labs  Recent Labs    10/13/2018 1126  FERRITIN 370*  LDH 576*  CRP 30.3*    Lab Results  Component Value Date   SARSCOV2NAA POSITIVE (A) 10/08/2018    CBC: Recent Labs  Lab 10/09/2018 1126 10/01/2018 1415  WBC 8.6 8.6  NEUTROABS 8.3*  --   HGB 14.9 14.4  HCT 44.1 43.0  MCV 91.3 91.9  PLT 389 341   Cardiac Enzymes: No results for input(s): CKTOTAL, CKMB, CKMBINDEX, TROPONINI in the last 168 hours. BNP (last 3 results) No results for input(s): PROBNP in the last 8760 hours. CBG: Recent Labs  Lab 09/30/2018 2017 10/12/18 0038 10/12/18 0447  GLUCAP 334* 265* 246*   D-Dimer: No results for input(s): DDIMER in the last 72 hours. Hgb A1c: Recent Labs    10/05/2018 1430  HGBA1C 9.1*   Lipid Profile: Recent Labs    09/29/2018 1126  TRIG 187*   Thyroid function studies: No results for input(s): TSH, T4TOTAL, T3FREE, THYROIDAB in the last 72 hours.  Invalid input(s): FREET3 Anemia work up: Recent Labs    09/28/2018 1126  FERRITIN 370*   Sepsis Labs: Recent Labs  Lab 10/24/2018 1126 10/09/2018 1135 10/16/2018 1415 10/27/2018 1420  PROCALCITON 0.19  --   --   --   WBC 8.6  --  8.6  --   LATICACIDVEN  --  2.8*  --  2.1*   Microbiology Recent Results (from the past 240 hour(s))  SARS Coronavirus 2 [facility] order, Performed in [facility] hospital lab) Nasopharyngeal Nasopharyngeal Swab     Status: Abnormal   Collection Time: 10/06/2018 11:30 AM   Specimen: Nasopharyngeal Swab  Result Value Ref Range Status   SARS Coronavirus 2 POSITIVE (A) NEGATIVE Final    Comment: RESULT CALLED TO, READ BACK BY AND VERIFIED WITH: RN H HARDY AT 1304 10/15/2018 BY L BENFIELD (NOTE) If result is NEGATIVE SARS-CoV-2 target nucleic acids are NOT DETECTED. The SARS-CoV-2 RNA is generally detectable in upper and lower   respiratory specimens during the acute phase of infection. The lowest  concentration of SARS-CoV-2 viral copies this assay can detect is 250  copies / mL. A negative result does not preclude SARS-CoV-2 infection  and should not be used as the sole basis for treatment or other  patient management decisions.  A negative result may occur with  improper specimen collection / handling, submission of specimen other  than nasopharyngeal swab, presence of viral mutation(s) within the  areas targeted by this assay, and inadequate number of viral copies  (<250 copies /  mL). A negative result must be combined with clinical  observations, patient history, and epidemiological information. If result is POSITIVE SARS-CoV-2 target nucleic acids are DETEC TED. The SARS-CoV-2 RNA is generally detectable in upper and lower  respiratory specimens during the acute phase of infection.  Positive  results are indicative of active infection with SARS-CoV-2.  Clinical  correlation with patient history and other diagnostic information is  necessary to determine patient infection status.  Positive results do  not rule out bacterial infection or co-infection with other viruses. If result is PRESUMPTIVE POSTIVE SARS-CoV-2 nucleic acids MAY BE PRESENT.   A presumptive positive result was obtained on the submitted specimen  and confirmed on repeat testing.  While 2019 novel coronavirus  (SARS-CoV-2) nucleic acids may be present in the submitted sample  additional confirmatory testing may be necessary for epidemiological  and / or clinical management purposes  to differentiate between  SARS-CoV-2 and other Sarbecovirus currently known to infect humans.  If clinically indicated additional testing with an alternate test  methodology (LAB7 453) is advised. The SARS-CoV-2 RNA is generally  detectable in upper and lower respiratory specimens during the acute  phase of infection. The expected result is Negative. Fact  Sheet for Patients:  BoilerBrush.com.cy Fact Sheet for Healthcare Providers: https://pope.com/ This test is not yet approved or cleared by the [place] FDA and has been authorized for detection and/or diagnosis of SARS-CoV-2 by FDA under an Emergency Use Authorization (EUA).  This EUA will remain in effect (meaning this test can be used) for the duration of the COVID-19 declaration under Section 564(b)(1) of the Act, 21 U.S.C. section 360bbb-3(b)(1), unless the authorization is terminated or revoked sooner. Performed at [facility], [address] [place], [place] [address]      Medications:   . Chlorhexidine Gluconate Cloth  6 each Topical Daily  . dexamethasone (DECADRON) injection  6 mg Intravenous Q12H  . enoxaparin (LOVENOX) injection  45 mg Subcutaneous Q12H  . famotidine  20 mg Oral BID  . insulin aspart  3-9 Units Subcutaneous Q4H   Continuous Infusions: . remdesivir 100 mg in NS 250 mL        LOS: 1 day   Marinda Elk  [facility]  10/12/2018, 7:30 AM

## 2018-10-12 NOTE — Progress Notes (Signed)
NAMEThijs Gill, MRN:  [id number], DOB:  1955/01/28, LOS: 1 ADMISSION DATE:  10/16/2018, CONSULTATION DATE:  10/12/18 REFERRING MD:  Delane Ginger- ED, CHIEF COMPLAINT:  hypoxic   Brief History   63 year old man with hx of obesity, DM p/w COVID PNA.  History of present illness   Thomas Gill is a 63 y/o gentleman diagnosed with covid last week. When there health department called him to follow up they referred him to the ED and sent an ambulance by his house. He had had a cough and diarrhea, but denies SOB. When EMS arrived to his house he was saturating in the 60s on RA. In the ED he was saturating in the 70s, but responded to prone ventilation.   Past Medical History  Former tobacco abuse DM  Significant Hospital Events   9/14 Admitted  Consults:  PCCM  Procedures:  NA  Significant Diagnostic Tests:  COVID positive  Micro Data:  covid pos  Antimicrobials:  none   Interim history/subjective:  No major events overnight.  Hypoxemic when supine improves markedly with proning.  Denies pain.  Objective   Blood pressure (!) 135/96, pulse 61, temperature 98.9 F (37.2 C), temperature source Oral, resp. rate (!) 32, height 5\' 3"  (1.6 m), weight 93 kg, SpO2 (!) 85 %.    FiO2 (%):  [100 %] 100 %   Intake/Output Summary (Last 24 hours) at 10/12/2018 0843 Last data filed at 10/12/2018 0600 Gross per 24 hour  Intake 550 ml  Output 1000 ml  Net -450 ml   Filed Weights   10/14/2018 1207  Weight: 93 kg    Examination: GEN: overweight man in NAD HEENT: NRB in place, trachea midline CV: RRR, ext warm PULM: occasional crackles but otherwise clear, RR slightly elevated, no accessory muscle use GI: protuberant but soft EXT: no edema NEURO: moves all 4 ext to command PSYCH: appears oriented per his discussion with hospitalist in [place]: No rashes  Glucoses in [facility] Problem list     Assessment & Plan:  COVID 19 viral pneumonia causing acute hypoxic  respiratory failure - Continue dexamethasone/remdesivir per usual protocol - Tolerate periods of hypoxemia, goal at rest is greater than 85% SaO2, with movement ideally above 75% - Decision for intubation should be based on a change in mental status or physical evidence of ventilatory failure such as nasal flaring, accessory muscle use, paradoxical breathing - Up to chair as much as possible during day - Incentive spirometry is important, use every hour - Attempt prone positioning while in bed - Remains in ICU with high risk of needing mechanical ventilation  DM type 2- high sugars with steroids.  A1c 9.1% on admission indicating average home glucose of 214. - SSI, long acting insulin per primary, goal 100-180  Transaminitis- viral vs. Medication effect, keep an eye on - f/u labs scheduled for 1PM  Hyponatremia, AKI- encourage PO, recheck this afternoon  Best practice:  Diet: Diabetic Pain/Anxiety/Delirium protocol (if indicated): n/a VAP protocol (if indicated): n/a DVT prophylaxis: lovenox GI prophylaxis: pepcid Glucose control: long acting, mealtime, and SSI per primary Mobility: up to chair Code Status: full Family Communication: per primary Disposition: ICU  Labs   CBC: Recent Labs  Lab 10/08/2018 1126 10/10/2018 1415  WBC 8.6 8.6  NEUTROABS 8.3*  --   HGB 14.9 14.4  HCT 44.1 43.0  MCV 91.3 91.9  PLT 389 614    Basic Metabolic Panel: Recent Labs  Lab 10/24/2018 1126 09/29/2018  1415  NA 131*  --   K 4.1  --   CL 90*  --   CO2 22  --   GLUCOSE 285*  --   BUN 36*  --   CREATININE 1.29* 1.07  CALCIUM 8.2*  --    GFR: Estimated Creatinine Clearance: 74 mL/min (by C-G formula based on SCr of 1.07 mg/dL). Recent Labs  Lab 04-06-2018 1126 04-06-2018 1135 04-06-2018 1415 04-06-2018 1420  PROCALCITON 0.19  --   --   --   WBC 8.6  --  8.6  --   LATICACIDVEN  --  2.8*  --  2.1*    Liver Function Tests: Recent Labs  Lab 04-06-2018 1126  AST 64*  ALT 65*  ALKPHOS 91   BILITOT 1.0  PROT 6.9  ALBUMIN 2.3*   No results for input(s): LIPASE, AMYLASE in the last 168 hours. No results for input(s): AMMONIA in the last 168 hours.  ABG No results found for: PHART, PCO2ART, PO2ART, HCO3, TCO2, ACIDBASEDEF, O2SAT   Coagulation Profile: No results for input(s): INR, PROTIME in the last 168 hours.  Cardiac Enzymes: No results for input(s): CKTOTAL, CKMB, CKMBINDEX, TROPONINI in the last 168 hours.  HbA1C: Hgb A1c MFr Bld  Date/Time Value Ref Range Status  03-10-202020 02:30 PM 9.1 (H) 4.8 - 5.6 % Final    Comment:    (NOTE)         Prediabetes: 5.7 - 6.4         Diabetes: >6.4         Glycemic control for adults with diabetes: <7.0     CBG: Recent Labs  Lab 04-06-2018 2017 10/12/18 0038 10/12/18 0447 10/12/18 0737  GLUCAP 334* 265* 246* 222*   CC time: 31 minutes  Lorin Glassaniel C Burney Calzadilla, MD 10/12/18 8:43 AM [facility]

## 2018-10-13 DIAGNOSIS — J1289 Other viral pneumonia: Secondary | ICD-10-CM | POA: Diagnosis not present

## 2018-10-13 DIAGNOSIS — U071 COVID-19: Secondary | ICD-10-CM | POA: Diagnosis not present

## 2018-10-13 DIAGNOSIS — N179 Acute kidney failure, unspecified: Secondary | ICD-10-CM | POA: Diagnosis not present

## 2018-10-13 DIAGNOSIS — J9601 Acute respiratory failure with hypoxia: Secondary | ICD-10-CM | POA: Diagnosis not present

## 2018-10-13 LAB — CBC
HCT: 41.7 % (ref 39.0–52.0)
Hemoglobin: 13.5 g/dL (ref 13.0–17.0)
MCH: 30.5 pg (ref 26.0–34.0)
MCHC: 32.4 g/dL (ref 30.0–36.0)
MCV: 94.3 fL (ref 80.0–100.0)
Platelets: 246 10*3/uL (ref 150–400)
RBC: 4.42 MIL/uL (ref 4.22–5.81)
RDW: 13 % (ref 11.5–15.5)
WBC: 11.2 10*3/uL — ABNORMAL HIGH (ref 4.0–10.5)
nRBC: 1 % — ABNORMAL HIGH (ref 0.0–0.2)

## 2018-10-13 LAB — COMPREHENSIVE METABOLIC PANEL
ALT: 57 U/L — ABNORMAL HIGH (ref 0–44)
AST: 49 U/L — ABNORMAL HIGH (ref 15–41)
Albumin: 2.4 g/dL — ABNORMAL LOW (ref 3.5–5.0)
Alkaline Phosphatase: 83 U/L (ref 38–126)
Anion gap: 11 (ref 5–15)
BUN: 28 mg/dL — ABNORMAL HIGH (ref 6–20)
CO2: 29 mmol/L (ref 22–32)
Calcium: 8.5 mg/dL — ABNORMAL LOW (ref 8.9–10.3)
Chloride: 94 mmol/L — ABNORMAL LOW (ref 98–111)
Creatinine, Ser: 0.85 mg/dL (ref 0.61–1.24)
GFR calc Af Amer: >60 mL/min (ref >60)
GFR calc non Af Amer: >60 mL/min (ref >60)
Glucose, Bld: 216 mg/dL — ABNORMAL HIGH (ref 70–99)
Potassium: 4.3 mmol/L (ref 3.5–5.1)
Sodium: 134 mmol/L — ABNORMAL LOW (ref 135–145)
Total Bilirubin: 0.4 mg/dL (ref 0.3–1.2)
Total Protein: 6.3 g/dL — ABNORMAL LOW (ref 6.5–8.1)

## 2018-10-13 LAB — GLUCOSE, CAPILLARY
Glucose-Capillary: 210 mg/dL — ABNORMAL HIGH (ref 70–99)
Glucose-Capillary: 217 mg/dL — ABNORMAL HIGH (ref 70–99)
Glucose-Capillary: 112 mg/dL — ABNORMAL HIGH (ref 70–99)

## 2018-10-13 MED ORDER — ACETAMINOPHEN 325 MG PO TABS
650.0000 mg | ORAL_TABLET | Freq: Four times a day (QID) | ORAL | Status: DC | PRN
  Administered 2018-10-13 – 2018-10-15 (×2): 650 mg via ORAL
  Filled 2018-10-13 (×2): qty 2

## 2018-10-13 MED ORDER — LORAZEPAM 2 MG/ML IJ SOLN
0.7500 mg | Freq: Once | INTRAMUSCULAR | Status: AC
  Administered 2018-10-13: 0.75 mg via INTRAVENOUS
  Filled 2018-10-13: qty 1

## 2018-10-13 MED ORDER — OXYCODONE HCL 5 MG PO TABS
5.0000 mg | ORAL_TABLET | Freq: Four times a day (QID) | ORAL | Status: DC | PRN
  Administered 2018-10-13: 5 mg via ORAL
  Filled 2018-10-13: qty 1

## 2018-10-13 NOTE — Progress Notes (Signed)
Inpatient Diabetes Program Recommendations  AACE/ADA: New Consensus Statement on Inpatient Glycemic Control (2015)  Target Ranges:  Prepandial:   less than 140 mg/dL      Peak postprandial:   less than 180 mg/dL (1-2 hours)      Critically ill patients:  140 - 180 mg/dL   Lab Results  Component Value Date   GLUCAP 210 (H) 10/13/2018   HGBA1C 9.1 (H) 10/16/2018    Review of Glycemic Control  Diabetes history: DM2 Outpatient Diabetes medications: glipizide 5 mg QAM, metformin 1000 mg bid Current orders for Inpatient glycemic control: Levemir 10 units bid, Novolog 0-20 units tidwc and 0-5 units QHS + 6 units tidwc for meal coverage.  Inpatient Diabetes Program Recommendations:     Increase Levemir to 12 units bid Increase Novolog to 8 units tidwc  Will need to f/u with PCP for uncontrolled DM with HgbA1C of 9.1%.  Continue to follow.   Thank you. Lorenda Peck, RD, LDN, CDE Inpatient Diabetes Coordinator [phone]

## 2018-10-13 NOTE — Progress Notes (Signed)
Pt's brother, Shanon Brow, contacted via facetime during morning rounds; he spoke directly to Dr. Lake Bells, who answered all questions and gave an update on the pt's status.

## 2018-10-13 NOTE — Progress Notes (Signed)
NAME:  Thomas Gill, MRN:  [id number], DOB:  [birth year], LOS: 2 ADMISSION DATE:  10/17/2018, CONSULTATION DATE:  9/15 REFERRING MD:  [place] ConstableStienl, CHIEF COMPLAINT:  dyspnea   Brief History   63 y/o male with obesity, DM2 admitted with COVID Pneumonia.   Past Medical History  Former smoker DM2  Significant Hospital Events   9/14 admission  Consults:  PCCM  Procedures:  n/a  Significant Diagnostic Tests:    Micro Data:  9/14 SARS COV 2 positive  Antimicrobials/COVID Rx  9/14 Decadron >  9/14 Remdesivir >    Interim history/subjective:  Says his appetite is good He feels OK He says his breathin is better when he is on his stomach  Objective   Blood pressure 94/66, pulse (!) 50, temperature 98 F (36.7 C), temperature source Oral, resp. rate 17, height 5\' 3"  (1.6 m), weight 92.4 kg, SpO2 91 %.    FiO2 (%):  [90 %-100 %] 100 %   Intake/Output Summary (Last 24 hours) at 10/13/2018 1339 Last data filed at 10/13/2018 [id number] Gross per 24 hour  Intake 910 ml  Output 900 ml  Net 10 ml   Filed Weights   10/18/2018 1207 10/13/18 0433  Weight: 93 kg 92.4 kg    Examination:  General:  Increased work of breathing but speaking in full sentences, no accessory muscle use HENT: NCAT OP clear PULM: Crackles bases B, normal effort CV: RRR, no mgr GI: BS+, soft, nontender MSK: normal bulk and tone Neuro: awake, alert, no distress, MAEW   Resolved Hospital Problem list     Assessment & Plan:  ARDS due to COVID 19 pneumonia Continue decadron/remdesivir per protocol Tolerate periods of hypoxemia, goal at rest is greater than 85% SaO2, with movement ideally above 75% Decision for intubation should be based on a change in mental status or physical evidence of ventilatory failure such as nasal flaring, accessory muscle use, paradoxical breathing Out of bed to chair as able Incentive spirometry is important, use every hour Prone positioning while in bed Remain in ICU, titrate heated  high flow oxygen, monitor carefuly for decompensation as he is high risk for intubation  Transaminitis: improving Repeat LFT in AM  Best practice:  Diet: regular diet Pain/Anxiety/Delirium protocol (if indicated): n/a VAP protocol (if indicated): n/a DVT prophylaxis: lovenox bid per ICU COVID protocol GI prophylaxis: n/a Glucose control: per [facility] Mobility: range of motion exercises as able in bed Code Status: full Family Communication: updated his brother by facetime connection in ICU today Disposition: remain in ICU  Labs   CBC: Recent Labs  Lab 10/10/2018 1126 09/30/2018 1415 10/12/18 1323 10/13/18 0420  WBC 8.6 8.6 11.7* 11.2*  NEUTROABS 8.3*  --   --   --   HGB 14.9 14.4 13.7 13.5  HCT 44.1 43.0 41.7 41.7  MCV 91.3 91.9 92.5 94.3  PLT 389 341 251 246    Basic Metabolic Panel: Recent Labs  Lab 10/27/2018 1126 10/24/2018 1415 10/12/18 1323 10/13/18 0420  NA 131*  --  133* 134*  K 4.1  --  3.9 4.3  CL 90*  --  93* 94*  CO2 22  --  28 29  GLUCOSE 285*  --  289* 216*  BUN 36*  --  30* 28*  CREATININE 1.29* 1.07 0.81 0.85  CALCIUM 8.2*  --  8.4* 8.5*   GFR: Estimated Creatinine Clearance: 92.9 mL/min (by C-G formula based on SCr of 0.85 mg/dL). Recent Labs  Lab 10/09/2018 1126 10/23/2018 1135 10/22/2018  1415 October 29, 2018 1420 10/12/18 1323 10/13/18 0420  PROCALCITON 0.19  --   --   --   --   --   WBC 8.6  --  8.6  --  11.7* 11.2*  LATICACIDVEN  --  2.8*  --  2.1*  --   --     Liver Function Tests: Recent Labs  Lab 2018-10-29 1126 10/12/18 1323 10/13/18 0420  AST 64* 65* 49*  ALT 65* 60* 57*  ALKPHOS 91 84 83  BILITOT 1.0 0.2* 0.4  PROT 6.9 6.5 6.3*  ALBUMIN 2.3* 2.4* 2.4*   No results for input(s): LIPASE, AMYLASE in the last 168 hours. No results for input(s): AMMONIA in the last 168 hours.  ABG No results found for: PHART, PCO2ART, PO2ART, HCO3, TCO2, ACIDBASEDEF, O2SAT   Coagulation Profile: No results for input(s): INR, PROTIME in the last 168 hours.   Cardiac Enzymes: No results for input(s): CKTOTAL, CKMB, CKMBINDEX, TROPONINI in the last 168 hours.  HbA1C: Hgb A1c MFr Bld  Date/Time Value Ref Range Status  2018-10-29 02:30 PM 9.1 (H) 4.8 - 5.6 % Final    Comment:    (NOTE)         Prediabetes: 5.7 - 6.4         Diabetes: >6.4         Glycemic control for adults with diabetes: <7.0     CBG: Recent Labs  Lab 10/12/18 1115 10/12/18 1637 10/12/18 2142 10/13/18 0743 10/13/18 1218  GLUCAP 286* 270* 238* 210* 217*       Critical care time: 35 minutes    Roselie Awkward, MD [facility] Pager: [phone] Cell: [phone] If no response, call [phone]

## 2018-10-13 NOTE — Progress Notes (Signed)
Pt has been satting low (70s) but without AMS or labored breathing. At approximately 2300, pt started with grunting respirations in the 40s, unable to speak more than one word sentences. Proned with improvement in Spo2 (88-90%) but no change in respiratory distress. Dr. Olevia Bowens notified.

## 2018-10-13 NOTE — Progress Notes (Signed)
PROGRESS NOTE  Thomas Gill WUX:[id number] DOB: 1955/11/13 DOA: 10/14/2018 PCP: Patient, No Pcp Per   LOS: 2 days   Brief Narrative / Interim history: Thomas Gill is an 63 y.o. male diagnosed with COVID-19 last week test on 10/27/2018 was positive, presents to the ED with progressive shortness of breath, cough and diarrhea.  When EMS arrived at his house he was satting in the 60s on room air.  Subjective: -feeling a little bit better this morning   Assessment & Plan: Active Problems:   Pneumonia due to COVID-19 virus   Acute respiratory failure with hypoxia (HCC)  Principal Problem Acute hypoxic respiratory failure due to Covid 19 pneumonia -remains profoundly hypoxic requiring 100 FIO2, continue supportive care in ICU -Remdesevir started on 9/14, will do 5 days, today day 3/5 -continue decadron, monitor inflammatory markers  Active Problems DM2 with hyperglycemia -continue insulin, hold home agents  CBG (last 3)  Recent Labs    10/12/18 1637 10/12/18 2142 10/13/18 0743  GLUCAP 270* 238* 210*    Scheduled Meds: . Chlorhexidine Gluconate Cloth  6 each Topical Daily  . dexamethasone (DECADRON) injection  6 mg Intravenous Q12H  . enoxaparin (LOVENOX) injection  45 mg Subcutaneous Q12H  . famotidine  20 mg Oral BID  . insulin aspart  0-20 Units Subcutaneous TID WC  . insulin aspart  0-5 Units Subcutaneous QHS  . insulin aspart  6 Units Subcutaneous TID WC  . insulin detemir  10 Units Subcutaneous BID  . mouth rinse  15 mL Mouth Rinse BID   Continuous Infusions: . remdesivir 100 mg in NS 250 mL Stopped (10/12/18 1742)   PRN Meds:.  DVT prophylaxis: Lovenox Code Status: Full code Family Communication: d/w patient  Disposition Plan: remain in ICU  Consultants:   PCCM  Procedures:   None   Antimicrobials:  None    Objective: Vitals:   10/13/18 0500 10/13/18 0600 10/13/18 0747 10/13/18 0753  BP: 91/67 94/66    Pulse: (!) 51 (!) 50    Resp: 14 17     Temp:   98 F (36.7 C)   TempSrc:   Oral   SpO2: (!) 86% (!) 80%  91%  Weight:      Height:        Intake/Output Summary (Last 24 hours) at 10/13/2018 1245 Last data filed at 10/13/2018 0713 Gross per 24 hour  Intake 910 ml  Output 900 ml  Net 10 ml   Filed Weights   10/24/2018 1207 10/13/18 0433  Weight: 93 kg 92.4 kg    Examination:  Constitutional: NAD Eyes: no scleral icterus  ENMT: Mucous membranes are moist. No oropharyngeal exudates Neck: normal, supple Respiratory: coarse breath sounds bilaterally, no wheezing, diminished at the bases Cardiovascular: Regular rate and rhythm, no murmurs / rubs / gallops. No LE edema. Abdomen: no tenderness. Bowel sounds positive.  Musculoskeletal: no clubbing / cyanosis.  Skin: no rashes Neurologic: non focal   Data Reviewed: I have independently reviewed following labs and imaging studies   CBC: Recent Labs  Lab 10/04/2018 1126 10/03/2018 1415 10/12/18 1323 10/13/18 0420  WBC 8.6 8.6 11.7* 11.2*  NEUTROABS 8.3*  --   --   --   HGB 14.9 14.4 13.7 13.5  HCT 44.1 43.0 41.7 41.7  MCV 91.3 91.9 92.5 94.3  PLT 389 341 251 253   Basic Metabolic Panel: Recent Labs  Lab 10/23/2018 1126 09/30/2018 1415 10/12/18 1323 10/13/18 0420  NA 131*  --  133* 134*  K  4.1  --  3.9 4.3  CL 90*  --  93* 94*  CO2 22  --  28 29  GLUCOSE 285*  --  289* 216*  BUN 36*  --  30* 28*  CREATININE 1.29* 1.07 0.81 0.85  CALCIUM 8.2*  --  8.4* 8.5*   GFR: Estimated Creatinine Clearance: 92.9 mL/min (by C-G formula based on SCr of 0.85 mg/dL). Liver Function Tests: Recent Labs  Lab 10/20/2018 1126 10/12/18 1323 10/13/18 0420  AST 64* 65* 49*  ALT 65* 60* 57*  ALKPHOS 91 84 83  BILITOT 1.0 0.2* 0.4  PROT 6.9 6.5 6.3*  ALBUMIN 2.3* 2.4* 2.4*   No results for input(s): LIPASE, AMYLASE in the last 168 hours. No results for input(s): AMMONIA in the last 168 hours. Coagulation Profile: No results for input(s): INR, PROTIME in the last 168 hours.  Cardiac Enzymes: No results for input(s): CKTOTAL, CKMB, CKMBINDEX, TROPONINI in the last 168 hours. BNP (last 3 results) No results for input(s): PROBNP in the last 8760 hours. HbA1C: Recent Labs    10/10/2018 1430  HGBA1C 9.1*   CBG: Recent Labs  Lab 10/12/18 0737 10/12/18 1115 10/12/18 1637 10/12/18 2142 10/13/18 0743  GLUCAP 222* 286* 270* 238* 210*   Lipid Profile: Recent Labs    09/29/2018 1126  TRIG 187*   Thyroid Function Tests: No results for input(s): TSH, T4TOTAL, FREET4, T3FREE, THYROIDAB in the last 72 hours. Anemia Panel: Recent Labs    09/29/2018 1126  FERRITIN 370*   Urine analysis:    Component Value Date/Time   COLORURINE yellow 12/22/2007 0826   APPEARANCEUR Clear 12/22/2007 0826   LABSPEC 1.025 12/22/2007 0826   PHURINE 6.0 12/22/2007 0826   HGBUR negative 12/22/2007 0826   BILIRUBINUR negative 12/12/2014 1241   KETONESUR negative 12/12/2014 1241   PROTEINUR negative 12/12/2014 1241   UROBILINOGEN 0.2 12/12/2014 1241   UROBILINOGEN 0.2 12/22/2007 0826   NITRITE Negative 12/12/2014 1241   NITRITE negative 12/22/2007 0826   LEUKOCYTESUR Negative 12/12/2014 1241   Sepsis Labs: Invalid input(s): PROCALCITONIN, LACTICIDVEN  Recent Results (from the past 240 hour(s))  SARS Coronavirus 2 [facility](Hospital order, Performed [facility]) Nasopharyngeal Nasopharyngeal Swab     Status: Abnormal   Collection Time: 10/13/2018 11:30 AM   Specimen: Nasopharyngeal Swab  Result Value Ref Range Status   SARS Coronavirus 2 POSITIVE (A) NEGATIVE Final    Comment: RESULT CALLED TO, READ BACK BY AND VERIFIED WITH: RN H HARDY AT 1304 10/17/2018 BY L BENFIELD (NOTE) If result is NEGATIVE SARS-CoV-2 target nucleic acids are NOT DETECTED. The SARS-CoV-2 RNA is generally detectable in upper and lower  respiratory specimens during the acute phase of infection. The lowest  concentration of SARS-CoV-2 viral copies this assay can detect is 250  copies / mL. A  negative result does not preclude SARS-CoV-2 infection  and should not be used as the sole basis for treatment or other  patient management decisions.  A negative result may occur with  improper specimen collection / handling, submission of specimen other  than nasopharyngeal swab, presence of viral mutation(s) within the  areas targeted by this assay, and inadequate number of viral copies  (<250 copies / mL). A negative result must be combined with clinical  observations, patient history, and epidemiological information. If result is POSITIVE SARS-CoV-2 target nucleic acids are DETEC TED. The SARS-CoV-2 RNA is generally detectable in upper and lower  respiratory specimens during the acute phase of infection.  Positive  results are indicative of active infection with SARS-CoV-2.  Clinical  correlation with patient history and other diagnostic information is  necessary to determine patient infection status.  Positive results do  not rule out bacterial infection or co-infection with other viruses. If result is PRESUMPTIVE POSTIVE SARS-CoV-2 nucleic acids MAY BE PRESENT.   A presumptive positive result was obtained on the submitted specimen  and confirmed on repeat testing.  While 2019 novel coronavirus  (SARS-CoV-2) nucleic acids may be present in the submitted sample  additional confirmatory testing may be necessary for epidemiological  and / or clinical management purposes  to differentiate between  SARS-CoV-2 and other Sarbecovirus currently known to infect humans.  If clinically indicated additional testing with an alternate test  methodology (LAB7 453) is advised. The SARS-CoV-2 RNA is generally  detectable in upper and lower respiratory specimens during the acute  phase of infection. The expected result is Negative. Fact Sheet for Patients:  BoilerBrush.com.cy Fact Sheet for Healthcare Providers: https://pope.com/ This test is not  yet approved or cleared by the [place] FDA and has been authorized for detection and/or diagnosis of SARS-CoV-2 by FDA under an Emergency Use Authorization (EUA).  This EUA will remain in effect (meaning this test can be used) for the duration of the COVID-19 declaration under Section 564(b)(1) of the Act, 21 U.S.C. section 360bbb-3(b)(1), unless the authorization is terminated or revoked sooner. Performed at [facility], [address] [place], [place] [address]   MRSA PCR Screening     Status: None   Collection Time: 10/12/18  9:05 PM   Specimen: Nasal Mucosa; Nasopharyngeal  Result Value Ref Range Status   MRSA by PCR NEGATIVE NEGATIVE Final    Comment:        The GeneXpert MRSA Assay (FDA approved for NASAL specimens only), is one component of a comprehensive MRSA colonization surveillance program. It is not intended to diagnose MRSA infection nor to guide or monitor treatment for MRSA infections. Performed at [facility], [address] [place], [place] [address]       Radiology Studies: No results found.  Pamella Pert, MD, PhD [facility]  Contact via  [email or web address]  [facility] Office Info P: [phone] F: [phone]

## 2018-10-14 ENCOUNTER — Inpatient Hospital Stay (HOSPITAL_COMMUNITY): Payer: Medicaid Other

## 2018-10-14 ENCOUNTER — Inpatient Hospital Stay (HOSPITAL_COMMUNITY): Payer: Medicaid Other | Admitting: Certified Registered Nurse Anesthetist

## 2018-10-14 DIAGNOSIS — N179 Acute kidney failure, unspecified: Secondary | ICD-10-CM | POA: Diagnosis not present

## 2018-10-14 DIAGNOSIS — J1289 Other viral pneumonia: Secondary | ICD-10-CM | POA: Diagnosis not present

## 2018-10-14 DIAGNOSIS — U071 COVID-19: Secondary | ICD-10-CM | POA: Diagnosis not present

## 2018-10-14 DIAGNOSIS — J9601 Acute respiratory failure with hypoxia: Secondary | ICD-10-CM | POA: Diagnosis not present

## 2018-10-14 DIAGNOSIS — G9340 Encephalopathy, unspecified: Secondary | ICD-10-CM

## 2018-10-14 LAB — CBC
HCT: 45.9 % (ref 39.0–52.0)
Hemoglobin: 14.4 g/dL (ref 13.0–17.0)
MCH: 30.3 pg (ref 26.0–34.0)
MCHC: 31.4 g/dL (ref 30.0–36.0)
MCV: 96.6 fL (ref 80.0–100.0)
Platelets: 187 10*3/uL (ref 150–400)
RBC: 4.75 MIL/uL (ref 4.22–5.81)
RDW: 13.3 % (ref 11.5–15.5)
WBC: 12.2 10*3/uL — ABNORMAL HIGH (ref 4.0–10.5)
nRBC: 1.8 % — ABNORMAL HIGH (ref 0.0–0.2)

## 2018-10-14 LAB — POCT I-STAT 7, (LYTES, BLD GAS, ICA,H+H)
Acid-Base Excess: 4 mmol/L — ABNORMAL HIGH (ref 0.0–2.0)
Acid-Base Excess: 4 mmol/L — ABNORMAL HIGH (ref 0.0–2.0)
Bicarbonate: 30.3 mmol/L — ABNORMAL HIGH (ref 20.0–28.0)
Bicarbonate: 31 mmol/L — ABNORMAL HIGH (ref 20.0–28.0)
Calcium, Ion: 1.15 mmol/L (ref 1.15–1.40)
Calcium, Ion: 1.13 mmol/L — ABNORMAL LOW (ref 1.15–1.40)
HCT: 43 % (ref 39.0–52.0)
HCT: 43 % (ref 39.0–52.0)
Hemoglobin: 14.6 g/dL (ref 13.0–17.0)
Hemoglobin: 14.6 g/dL (ref 13.0–17.0)
O2 Saturation: 81 %
O2 Saturation: 94 %
Patient temperature: 98.6
Patient temperature: 37.2
Potassium: 3.8 mmol/L (ref 3.5–5.1)
Potassium: 5.2 mmol/L — ABNORMAL HIGH (ref 3.5–5.1)
Sodium: 135 mmol/L (ref 135–145)
Sodium: 137 mmol/L (ref 135–145)
TCO2: 32 mmol/L (ref 22–32)
TCO2: 33 mmol/L — ABNORMAL HIGH (ref 22–32)
pCO2 arterial: 51.9 mmHg — ABNORMAL HIGH (ref 32.0–48.0)
pCO2 arterial: 53.4 mmHg — ABNORMAL HIGH (ref 32.0–48.0)
pH, Arterial: 7.374 (ref 7.350–7.450)
pH, Arterial: 7.373 (ref 7.350–7.450)
pO2, Arterial: 47 mmHg — ABNORMAL LOW (ref 83.0–108.0)
pO2, Arterial: 75 mmHg — ABNORMAL LOW (ref 83.0–108.0)

## 2018-10-14 LAB — COMPREHENSIVE METABOLIC PANEL
ALT: 64 U/L — ABNORMAL HIGH (ref 0–44)
AST: 60 U/L — ABNORMAL HIGH (ref 15–41)
Albumin: 2.5 g/dL — ABNORMAL LOW (ref 3.5–5.0)
Alkaline Phosphatase: 108 U/L (ref 38–126)
Anion gap: 11 (ref 5–15)
BUN: 22 mg/dL — ABNORMAL HIGH (ref 6–20)
CO2: 29 mmol/L (ref 22–32)
Calcium: 8.1 mg/dL — ABNORMAL LOW (ref 8.9–10.3)
Chloride: 97 mmol/L — ABNORMAL LOW (ref 98–111)
Creatinine, Ser: 0.85 mg/dL (ref 0.61–1.24)
GFR calc Af Amer: >60 mL/min (ref >60)
GFR calc non Af Amer: >60 mL/min (ref >60)
Glucose, Bld: 150 mg/dL — ABNORMAL HIGH (ref 70–99)
Potassium: 4.6 mmol/L (ref 3.5–5.1)
Sodium: 137 mmol/L (ref 135–145)
Total Bilirubin: 0.6 mg/dL (ref 0.3–1.2)
Total Protein: 6.5 g/dL (ref 6.5–8.1)

## 2018-10-14 LAB — FERRITIN: Ferritin: 172 ng/mL (ref 24–336)

## 2018-10-14 LAB — GLUCOSE, CAPILLARY
Glucose-Capillary: 237 mg/dL — ABNORMAL HIGH (ref 70–99)
Glucose-Capillary: 145 mg/dL — ABNORMAL HIGH (ref 70–99)
Glucose-Capillary: 161 mg/dL — ABNORMAL HIGH (ref 70–99)
Glucose-Capillary: 125 mg/dL — ABNORMAL HIGH (ref 70–99)
Glucose-Capillary: 114 mg/dL — ABNORMAL HIGH (ref 70–99)
Glucose-Capillary: 160 mg/dL — ABNORMAL HIGH (ref 70–99)

## 2018-10-14 LAB — C-REACTIVE PROTEIN: CRP: 13 mg/dL — ABNORMAL HIGH (ref <1.0)

## 2018-10-14 LAB — LACTATE DEHYDROGENASE: LDH: 670 U/L — ABNORMAL HIGH (ref 98–192)

## 2018-10-14 LAB — D-DIMER, QUANTITATIVE: D-Dimer, Quant: >20 ug/mL-FEU — ABNORMAL HIGH (ref 0.00–0.50)

## 2018-10-14 MED ORDER — FENTANYL 2500MCG IN NS 250ML (10MCG/ML) PREMIX INFUSION
INTRAVENOUS | Status: AC
  Filled 2018-10-14: qty 250

## 2018-10-14 MED ORDER — MIDAZOLAM HCL 2 MG/2ML IJ SOLN
INTRAMUSCULAR | Status: AC
  Filled 2018-10-14: qty 4

## 2018-10-14 MED ORDER — MIDAZOLAM HCL 2 MG/2ML IJ SOLN
2.0000 mg | INTRAMUSCULAR | Status: DC | PRN
  Administered 2018-10-14 (×3): 2 mg via INTRAVENOUS

## 2018-10-14 MED ORDER — FENTANYL CITRATE (PF) 100 MCG/2ML IJ SOLN
100.0000 ug | Freq: Once | INTRAMUSCULAR | Status: AC
  Administered 2018-10-14: 100 ug via INTRAVENOUS

## 2018-10-14 MED ORDER — MIDAZOLAM 50MG/50ML (1MG/ML) PREMIX INFUSION
0.5000 mg/h | INTRAVENOUS | Status: DC
  Administered 2018-10-14: 3.5 mg/h via INTRAVENOUS
  Administered 2018-10-14: 01:00:00 0.5 mg/h via INTRAVENOUS
  Filled 2018-10-14: qty 50

## 2018-10-14 MED ORDER — ETOMIDATE 2 MG/ML IV SOLN
INTRAVENOUS | Status: DC | PRN
  Administered 2018-10-14: 12 mg via INTRAVENOUS

## 2018-10-14 MED ORDER — MIDAZOLAM HCL (PF) 5 MG/ML IJ SOLN
4.0000 mg | Freq: Once | INTRAMUSCULAR | Status: AC
  Administered 2018-10-14: 4 mg via INTRAVENOUS

## 2018-10-14 MED ORDER — MIDAZOLAM 50MG/50ML (1MG/ML) PREMIX INFUSION
INTRAVENOUS | Status: AC
  Administered 2018-10-14: 0.5 mg/h via INTRAVENOUS
  Filled 2018-10-14: qty 50

## 2018-10-14 MED ORDER — VECURONIUM BROMIDE 10 MG IV SOLR
INTRAVENOUS | Status: AC
  Filled 2018-10-14: qty 10

## 2018-10-14 MED ORDER — ROCURONIUM BROMIDE 50 MG/5ML IV SOLN
100.0000 mg | Freq: Once | INTRAVENOUS | Status: AC
  Administered 2018-10-14: 30 mg via INTRAVENOUS
  Filled 2018-10-14: qty 10

## 2018-10-14 MED ORDER — MIDAZOLAM HCL 2 MG/2ML IJ SOLN
2.0000 mg | Freq: Once | INTRAMUSCULAR | Status: AC
  Administered 2018-10-14: 2 mg via INTRAVENOUS

## 2018-10-14 MED ORDER — ORAL CARE MOUTH RINSE
15.0000 mL | OROMUCOSAL | Status: DC
  Administered 2018-10-14 – 2018-10-25 (×115): 15 mL via OROMUCOSAL

## 2018-10-14 MED ORDER — FUROSEMIDE 10 MG/ML IJ SOLN
40.0000 mg | Freq: Three times a day (TID) | INTRAMUSCULAR | Status: AC
  Administered 2018-10-14 (×2): 40 mg via INTRAVENOUS
  Filled 2018-10-14 (×2): qty 4

## 2018-10-14 MED ORDER — ROCURONIUM BROMIDE 100 MG/10ML IV SOLN
INTRAVENOUS | Status: DC | PRN
  Administered 2018-10-14: 30 mg via INTRAVENOUS

## 2018-10-14 MED ORDER — SODIUM BICARBONATE 8.4 % IV SOLN
INTRAVENOUS | Status: AC
  Filled 2018-10-14: qty 100

## 2018-10-14 MED ORDER — ADULT MULTIVITAMIN W/MINERALS CH
1.0000 | ORAL_TABLET | Freq: Every day | ORAL | Status: DC
  Administered 2018-10-14 – 2018-10-25 (×12): 1
  Filled 2018-10-14 (×12): qty 1

## 2018-10-14 MED ORDER — FENTANYL BOLUS VIA INFUSION
50.0000 ug | INTRAVENOUS | Status: DC | PRN
  Administered 2018-10-14 – 2018-10-22 (×7): 50 ug via INTRAVENOUS
  Filled 2018-10-14: qty 50

## 2018-10-14 MED ORDER — VITAL HIGH PROTEIN PO LIQD
1000.0000 mL | ORAL | Status: DC
  Administered 2018-10-14 – 2018-10-18 (×5): 1000 mL

## 2018-10-14 MED ORDER — ARTIFICIAL TEARS OPHTHALMIC OINT
1.0000 "application " | TOPICAL_OINTMENT | Freq: Three times a day (TID) | OPHTHALMIC | Status: DC
  Administered 2018-10-14 – 2018-10-25 (×36): 1 via OPHTHALMIC
  Filled 2018-10-14 (×5): qty 3.5

## 2018-10-14 MED ORDER — FENTANYL 2500MCG IN NS 250ML (10MCG/ML) PREMIX INFUSION
0.0000 ug/h | INTRAVENOUS | Status: DC
  Administered 2018-10-14: 100 ug/h via INTRAVENOUS

## 2018-10-14 MED ORDER — FENTANYL 2500MCG IN NS 250ML (10MCG/ML) PREMIX INFUSION
50.0000 ug/h | INTRAVENOUS | Status: DC
  Administered 2018-10-14: 200 ug/h via INTRAVENOUS
  Administered 2018-10-15: 125 ug/h via INTRAVENOUS
  Administered 2018-10-15 – 2018-10-16 (×2): 100 ug/h via INTRAVENOUS
  Administered 2018-10-17 – 2018-10-20 (×6): 200 ug/h via INTRAVENOUS
  Administered 2018-10-20: 20:00:00 300 ug/h via INTRAVENOUS
  Administered 2018-10-21 – 2018-10-24 (×7): 250 ug/h via INTRAVENOUS
  Filled 2018-10-14 (×19): qty 250

## 2018-10-14 MED ORDER — MIDAZOLAM 50MG/50ML (1MG/ML) PREMIX INFUSION
2.0000 mg/h | INTRAVENOUS | Status: DC
  Administered 2018-10-14: 4 mg/h via INTRAVENOUS
  Administered 2018-10-14: 5 mg/h via INTRAVENOUS
  Administered 2018-10-15: 3 mg/h via INTRAVENOUS
  Administered 2018-10-15: 2 mg/h via INTRAVENOUS
  Administered 2018-10-15: 3 mg/h via INTRAVENOUS
  Administered 2018-10-16: 2 mg/h via INTRAVENOUS
  Administered 2018-10-17: 13:00:00 6 mg/h via INTRAVENOUS
  Administered 2018-10-17: 3 mg/h via INTRAVENOUS
  Administered 2018-10-19: 4 mg/h via INTRAVENOUS
  Administered 2018-10-19: 04:00:00 2 mg/h via INTRAVENOUS
  Administered 2018-10-20 – 2018-10-21 (×3): 4 mg/h via INTRAVENOUS
  Administered 2018-10-21 (×2): 6 mg/h via INTRAVENOUS
  Administered 2018-10-22 – 2018-10-24 (×5): 4 mg/h via INTRAVENOUS
  Filled 2018-10-14 (×20): qty 50

## 2018-10-14 MED ORDER — NOREPINEPHRINE 4 MG/250ML-% IV SOLN
0.0000 ug/min | INTRAVENOUS | Status: DC
  Administered 2018-10-15: 23 ug/min via INTRAVENOUS
  Administered 2018-10-15: 40 ug/min via INTRAVENOUS
  Administered 2018-10-15: 30 ug/min via INTRAVENOUS
  Administered 2018-10-15: 40 ug/min via INTRAVENOUS
  Administered 2018-10-15: 4 ug/min via INTRAVENOUS
  Administered 2018-10-15: 19 ug/min via INTRAVENOUS
  Administered 2018-10-15: 30 ug/min via INTRAVENOUS
  Administered 2018-10-15: 13 ug/min via INTRAVENOUS
  Filled 2018-10-14 (×10): qty 250

## 2018-10-14 MED ORDER — FENTANYL CITRATE (PF) 100 MCG/2ML IJ SOLN
100.0000 ug | INTRAMUSCULAR | Status: DC | PRN
  Administered 2018-10-14 (×4): 100 ug via INTRAVENOUS

## 2018-10-14 MED ORDER — PRO-STAT SUGAR FREE PO LIQD
60.0000 mL | Freq: Two times a day (BID) | ORAL | Status: DC
  Administered 2018-10-14 – 2018-10-19 (×10): 60 mL
  Filled 2018-10-14 (×10): qty 60

## 2018-10-14 MED ORDER — ETOMIDATE 2 MG/ML IV SOLN
20.0000 mg | Freq: Once | INTRAVENOUS | Status: AC
  Administered 2018-10-14: 01:00:00 12 mg via INTRAVENOUS

## 2018-10-14 MED ORDER — INSULIN ASPART 100 UNIT/ML ~~LOC~~ SOLN
0.0000 [IU] | SUBCUTANEOUS | Status: DC
  Administered 2018-10-14: 3 [IU] via SUBCUTANEOUS
  Administered 2018-10-14: 4 [IU] via SUBCUTANEOUS
  Administered 2018-10-14: 06:00:00 3 [IU] via SUBCUTANEOUS
  Administered 2018-10-14: 4 [IU] via SUBCUTANEOUS
  Administered 2018-10-15: 7 [IU] via SUBCUTANEOUS
  Administered 2018-10-15: 11 [IU] via SUBCUTANEOUS
  Administered 2018-10-15: 15 [IU] via SUBCUTANEOUS
  Administered 2018-10-15: 7 [IU] via SUBCUTANEOUS
  Administered 2018-10-15 – 2018-10-16 (×3): 15 [IU] via SUBCUTANEOUS
  Administered 2018-10-16: 4 [IU] via SUBCUTANEOUS
  Administered 2018-10-16 (×4): 7 [IU] via SUBCUTANEOUS
  Administered 2018-10-17: 4 [IU] via SUBCUTANEOUS
  Administered 2018-10-17: 7 [IU] via SUBCUTANEOUS
  Administered 2018-10-17: 4 [IU] via SUBCUTANEOUS
  Administered 2018-10-17: 11 [IU] via SUBCUTANEOUS
  Administered 2018-10-17: 08:00:00 4 [IU] via SUBCUTANEOUS
  Administered 2018-10-17: 11 [IU] via SUBCUTANEOUS
  Administered 2018-10-18: 3 [IU] via SUBCUTANEOUS
  Administered 2018-10-18: 17:00:00 11 [IU] via SUBCUTANEOUS
  Administered 2018-10-18: 05:00:00 15 [IU] via SUBCUTANEOUS
  Administered 2018-10-18: 11 [IU] via SUBCUTANEOUS
  Administered 2018-10-18: 10:00:00 15 [IU] via SUBCUTANEOUS
  Administered 2018-10-19 (×3): 4 [IU] via SUBCUTANEOUS
  Administered 2018-10-19: 12:00:00 7 [IU] via SUBCUTANEOUS
  Administered 2018-10-19: 4 [IU] via SUBCUTANEOUS
  Administered 2018-10-19: 7 [IU] via SUBCUTANEOUS
  Administered 2018-10-19: 21:00:00 4 [IU] via SUBCUTANEOUS
  Administered 2018-10-20: 09:00:00 11 [IU] via SUBCUTANEOUS
  Administered 2018-10-20: 17:00:00 15 [IU] via SUBCUTANEOUS
  Administered 2018-10-20 (×3): 11 [IU] via SUBCUTANEOUS
  Administered 2018-10-20: 7 [IU] via SUBCUTANEOUS
  Administered 2018-10-21: 04:00:00 3 [IU] via SUBCUTANEOUS
  Administered 2018-10-21: 7 [IU] via SUBCUTANEOUS
  Administered 2018-10-21: 11 [IU] via SUBCUTANEOUS
  Administered 2018-10-21: 08:00:00 4 [IU] via SUBCUTANEOUS
  Administered 2018-10-21: 16:00:00 11 [IU] via SUBCUTANEOUS
  Administered 2018-10-22: 15 [IU] via SUBCUTANEOUS
  Administered 2018-10-22: 04:00:00 7 [IU] via SUBCUTANEOUS
  Administered 2018-10-22: 11 [IU] via SUBCUTANEOUS
  Administered 2018-10-22: 15 [IU] via SUBCUTANEOUS
  Administered 2018-10-22: 7 [IU] via SUBCUTANEOUS
  Administered 2018-10-22 (×2): 11 [IU] via SUBCUTANEOUS
  Administered 2018-10-23: 15 [IU] via SUBCUTANEOUS
  Administered 2018-10-23: 12:00:00 20 [IU] via SUBCUTANEOUS
  Administered 2018-10-23 (×2): 11 [IU] via SUBCUTANEOUS
  Administered 2018-10-23: 7 [IU] via SUBCUTANEOUS
  Administered 2018-10-23: 04:00:00 11 [IU] via SUBCUTANEOUS
  Administered 2018-10-24: 4 [IU] via SUBCUTANEOUS
  Administered 2018-10-24: 11 [IU] via SUBCUTANEOUS
  Administered 2018-10-24: 7 [IU] via SUBCUTANEOUS
  Administered 2018-10-24: 20:00:00 11 [IU] via SUBCUTANEOUS
  Administered 2018-10-24: 4 [IU] via SUBCUTANEOUS
  Administered 2018-10-25: 7 [IU] via SUBCUTANEOUS
  Administered 2018-10-25 (×2): 4 [IU] via SUBCUTANEOUS
  Administered 2018-10-25: 11 [IU] via SUBCUTANEOUS

## 2018-10-14 MED ORDER — VECURONIUM BROMIDE 10 MG IV SOLR
0.1000 mg/kg | INTRAVENOUS | Status: DC | PRN
  Administered 2018-10-14 (×4): 9.2 mg via INTRAVENOUS
  Filled 2018-10-14 (×4): qty 10

## 2018-10-14 MED ORDER — SODIUM CHLORIDE 0.9 % IV SOLN
INTRAVENOUS | Status: DC | PRN
  Administered 2018-10-14 – 2018-10-23 (×7): 250 mL via INTRAVENOUS

## 2018-10-14 MED ORDER — ROCURONIUM BROMIDE 50 MG/5ML IV SOLN
1.0000 mg/kg | INTRAVENOUS | Status: DC | PRN
  Filled 2018-10-14: qty 9.24

## 2018-10-14 MED ORDER — CHLORHEXIDINE GLUCONATE 0.12% ORAL RINSE (MEDLINE KIT)
15.0000 mL | Freq: Two times a day (BID) | OROMUCOSAL | Status: DC
  Administered 2018-10-14 – 2018-10-25 (×23): 15 mL via OROMUCOSAL

## 2018-10-14 MED ORDER — MIDAZOLAM BOLUS VIA INFUSION
1.0000 mg | INTRAVENOUS | Status: DC | PRN
  Administered 2018-10-14 – 2018-10-22 (×8): 2 mg via INTRAVENOUS
  Filled 2018-10-14: qty 2

## 2018-10-14 NOTE — Progress Notes (Addendum)
NAME:  Anette GuarneriJesus Knechtel, MRN:  [id number], DOB:  06-Sep-1955, LOS: 3 ADMISSION DATE:  2018/06/17, CONSULTATION DATE:  9/15 REFERRING MD:  [place] ConstableStienl, CHIEF COMPLAINT:  dyspnea   Brief History   63 y/o male with obesity, DM2 admitted with COVID Pneumonia.   Past Medical History  Former smoker DM2  Significant Hospital Events   9/14 admission  Consults:  PCCM  Procedures:  ETT 9/17>>> PIV  Significant Diagnostic Tests:    Micro Data:  9/14 SARS COV 2 positive  Antimicrobials/COVID Rx  9/14 Decadron >  9/14 Remdesivir >    Interim history/subjective:  Decompensated and required intubation overnight  Objective   Blood pressure 127/74, pulse 77, temperature (!) 97.3 F (36.3 C), resp. rate 16, height 5\' 3"  (1.6 m), weight 92.3 kg, SpO2 92 %.    Vent Mode: PRVC FiO2 (%):  [90 %-100 %] 90 % Set Rate:  [16 bmp] 16 bmp Vt Set:  [450 mL] 450 mL PEEP:  [15 cmH20] 15 cmH20 Plateau Pressure:  [25 cmH20-28 cmH20] 28 cmH20   Intake/Output Summary (Last 24 hours) at 10/14/2018 [id number] Last data filed at 10/14/2018 [id number] Gross per 24 hour  Intake 1416.13 ml  Output 610 ml  Net 806.13 ml   Filed Weights   30-Sep-2018 1207 10/13/18 0433 10/14/18 0500  Weight: 93 kg 92.4 kg 92.3 kg   Examination:  General:  Acutely ill appearing male, NAD, intubated and sedated HENT: [place]/AT, PERRL, EOM-I and MMM, ETT in place PULM: Coarse BS diffusely CV: RRR, Nl S1/S2 and -M/R/G GI: Soft, NT, ND and +BS MSK: normal bulk and tone Neuro: Sedated and intubated but withdraws all ext to command  I reviewed CXR myself, ETT is in a good position, low volume issue resolved and infiltrate noted  Resolved Hospital Problem list     Assessment & Plan:  ARDS due to COVID 19 pneumonia Continue decadron/remdesivir per protocol Maintain on full vent support Start prone positioning cycle Titrate O2 for sat of 88-92% Increase PEEP to 18 and switch to PCV Lasix 40 mg IV q8 x2 doses BMET in AM Replace  electrolytes as indicated  Sedation: Fentanyl and versed drips to continue  Transaminitis: improving Trent LFT Begin TF per nutrition  Best practice:  Diet: regular diet Pain/Anxiety/Delirium protocol (if indicated): n/a VAP protocol (if indicated): n/a DVT prophylaxis: lovenox bid per ICU COVID protocol GI prophylaxis: n/a Glucose control: per TRH Mobility: range of motion exercises as able in bed Code Status: full Family Communication: updated his brother by facetime connection in ICU today Disposition: remain in ICU  Labs   CBC: Recent Labs  Lab 30-Sep-2018 1126 30-Sep-2018 1415 10/12/18 1323 10/13/18 0420 10/14/18 0135 10/14/18 0435  WBC 8.6 8.6 11.7* 11.2*  --  12.2*  NEUTROABS 8.3*  --   --   --   --   --   HGB 14.9 14.4 13.7 13.5 14.6 14.4  HCT 44.1 43.0 41.7 41.7 43.0 45.9  MCV 91.3 91.9 92.5 94.3  --  96.6  PLT 389 341 251 246  --  187    Basic Metabolic Panel: Recent Labs  Lab 30-Sep-2018 1126 30-Sep-2018 1415 10/12/18 1323 10/13/18 0420 10/14/18 0135 10/14/18 0435  NA 131*  --  133* 134* 135 137  K 4.1  --  3.9 4.3 3.8 4.6  CL 90*  --  93* 94*  --  97*  CO2 22  --  28 29  --  29  GLUCOSE 285*  --  289* 216*  --  150*  BUN 36*  --  30* 28*  --  22*  CREATININE 1.29* 1.07 0.81 0.85  --  0.85  CALCIUM 8.2*  --  8.4* 8.5*  --  8.1*   GFR: Estimated Creatinine Clearance: 92.9 mL/min (by C-G formula based on SCr of 0.85 mg/dL). Recent Labs  Lab 10/10/2018 1126 10/19/2018 1135 10/19/2018 1415 09/30/2018 1420 10/12/18 1323 10/13/18 0420 10/14/18 0435  PROCALCITON 0.19  --   --   --   --   --   --   WBC 8.6  --  8.6  --  11.7* 11.2* 12.2*  LATICACIDVEN  --  2.8*  --  2.1*  --   --   --     Liver Function Tests: Recent Labs  Lab 10/24/2018 1126 10/12/18 1323 10/13/18 0420 10/14/18 0435  AST 64* 65* 49* 60*  ALT 65* 60* 57* 64*  ALKPHOS 91 84 83 108  BILITOT 1.0 0.2* 0.4 0.6  PROT 6.9 6.5 6.3* 6.5  ALBUMIN 2.3* 2.4* 2.4* 2.5*   No results for input(s):  LIPASE, AMYLASE in the last 168 hours. No results for input(s): AMMONIA in the last 168 hours.  ABG    Component Value Date/Time   PHART 7.374 10/14/2018 0135   PCO2ART 51.9 (H) 10/14/2018 0135   PO2ART 47.0 (L) 10/14/2018 0135   HCO3 30.3 (H) 10/14/2018 0135   TCO2 32 10/14/2018 0135   O2SAT 81.0 10/14/2018 0135     Coagulation Profile: No results for input(s): INR, PROTIME in the last 168 hours.  Cardiac Enzymes: No results for input(s): CKTOTAL, CKMB, CKMBINDEX, TROPONINI in the last 168 hours.  HbA1C: Hgb A1c MFr Bld  Date/Time Value Ref Range Status  10/09/2018 02:30 PM 9.1 (H) 4.8 - 5.6 % Final    Comment:    (NOTE)         Prediabetes: 5.7 - 6.4         Diabetes: >6.4         Glycemic control for adults with diabetes: <7.0     CBG: Recent Labs  Lab 10/13/18 0743 10/13/18 1218 10/13/18 2136 10/14/18 0532 10/14/18 0753  GLUCAP 210* 217* 112* 145* 161*   The patient is critically ill with multiple organ systems failure and requires high complexity decision making for assessment and support, frequent evaluation and titration of therapies, application of advanced monitoring technologies and extensive interpretation of multiple databases.   Critical Care Time devoted to patient care services described in this note is  37  Minutes. This time reflects time of care of this signee Dr Jennet Maduro. This critical care time does not reflect procedure time, or teaching time or supervisory time of PA/NP/Med student/Med Resident etc but could involve care discussion time.  Rush Farmer, M.D. [facility] Pulmonary/Critical Care Medicine. Pager: [phone]. After hours pager: [phone].

## 2018-10-14 NOTE — Progress Notes (Signed)
As patient is now intubated and sedated, personal belongings sent to security. These are 1 black wallet containing $412 in cash as well as various cards, black iPhone, & key ring with [employer] key/remote fob and house key. Belongings inventory verified with Arelia Sneddon, RN.   Belongings remaining at bedside are shirt, flip flops, underwear and socks

## 2018-10-14 NOTE — Procedures (Signed)
Intubation Procedure Note Thomas Gill [id number] 12/10/1955  Procedure: Intubation Indications: Airway protection and maintenance  Procedure Details Consent: Unable to obtain consent because of altered level of consciousness. Time Out: Verified patient identification, verified procedure, site/side was marked, verified correct patient position, special equipment/implants available, medications/allergies/relevent history reviewed, required imaging and test results available.  Performed  Maximum sterile technique was used including cap, gloves, gown and hand hygiene.  MAC and 3    Evaluation Hemodynamic Status: BP stable throughout; O2 sats: transiently fell during during procedure Patient's Current Condition: stable Complications: No apparent complications Patient did tolerate procedure well. Chest X-ray ordered to verify placement.  CXR: pending.   Lamona Curl [facility] 10/14/2018

## 2018-10-14 NOTE — Transfer of Care (Signed)
Immediate Anesthesia Transfer of Care Note  Patient: Thomas Gill  Procedure(s) Performed: AN AD [place]  Patient Location: ICU  Anesthesia Type:General  Level of Consciousness: Patient remains intubated per anesthesia plan  Airway & Oxygen Therapy: Patient remains intubated per anesthesia plan and Patient placed on Ventilator (see vital sign flow sheet for setting)  Post-op Assessment: Report given to RN and Post -op Vital signs reviewed and stable  Post vital signs: Reviewed and stable  Last Vitals:  Vitals Value Taken Time  BP 166/142 10/14/18 0103  Temp    Pulse 96 10/14/18 0106  Resp 36 10/14/18 0106  SpO2 83 % 10/14/18 0106  Vitals shown include unvalidated device data.  Last Pain:  Vitals:   10/13/18 2000  TempSrc: Oral  PainSc: 0-No pain         Complications: No apparent anesthesia complications

## 2018-10-14 NOTE — Progress Notes (Signed)
CRNA called. MD Olevia Bowens elected to intubate.  2mg  versed given 12 of etomidate given 30 Roc given  2mg  versed and 168mcg fent  given post intubation

## 2018-10-14 NOTE — Anesthesia Procedure Notes (Signed)
Procedure Name: Intubation Date/Time: 10/14/2018 12:45 AM Performed by: Inda Coke, CRNA Pre-anesthesia Checklist: Patient identified, Emergency Drugs available, Suction available and Patient being monitored Patient Re-evaluated:Patient Re-evaluated prior to induction Oxygen Delivery Method: Circle System Utilized Preoxygenation: Pre-oxygenation with 100% oxygen Induction Type: IV induction Ventilation: Mask ventilation without difficulty Laryngoscope Size: Glidescope and 4 Grade View: Grade I Tube type: Oral Tube size: 7.5 mm Number of attempts: 1 Airway Equipment and Method: Stylet and Oral airway Placement Confirmation: ETT inserted through vocal cords under direct vision,  positive ETCO2 and breath sounds checked- equal and bilateral Secured at: 23 cm Tube secured with: Tape Dental Injury: Teeth and Oropharynx as per pre-operative assessment

## 2018-10-14 NOTE — Anesthesia Postprocedure Evaluation (Signed)
Anesthesia Post Note  Patient: Thomas Gill  Procedure(s) Performed: AN AD [place]     Patient location during evaluation: ICU Anesthesia Type: General Level of consciousness: sedated Pain management: pain level controlled Vital Signs Assessment: post-procedure vital signs reviewed and stable Respiratory status: patient on ventilator - see flowsheet for VS and respiratory function unstable Cardiovascular status: stable Anesthetic complications: no    Last Vitals:  Vitals:   10/13/18 2300 10/14/18 0048  BP: 107/74   Pulse: 61   Resp: (!) 48   Temp:    SpO2: (!) 78% (!) 86%    Last Pain:  Vitals:   10/13/18 2000  TempSrc: Oral  PainSc: 0-No pain                 Pervis Hocking

## 2018-10-14 NOTE — Progress Notes (Signed)
0020: Pt with acute mental status change, taking oxygen off and combative; diaphoretic and pale. Paradoxical breathing and accessory muscle use; Spo2 replaced and reading was 30%. Dr. Olevia Bowens notified and came to bedside; decision made to intubate.   0140: Pt severely dyssynchronous with vent despite sedation; sats in the 70s on 100% FiO2 and 14 PEEP. 9.2mg  of vecuronium administered with increased in sats to the 90s. Pt now synchronous with vent.

## 2018-10-14 NOTE — Progress Notes (Signed)
RT NOTE:  Pt in prone position. RT assisted with head turn to right. ETT taped @ center lip and secured @ 23cm.

## 2018-10-14 NOTE — Progress Notes (Signed)
Initial Nutrition Assessment  DOCUMENTATION CODES:   Obesity unspecified  INTERVENTION:   Initiate Vital High Protien @ 40 ml/hr 60 ml Prostat BID MVI daily  Provides: 1360 kcal, 144 grams protein, and 802 ml free water.    NUTRITION DIAGNOSIS:   Increased nutrient needs related to (COVID-19 PNA) as evidenced by estimated needs.  GOAL:   Patient will meet greater than or equal to 90% of their needs  MONITOR:   TF tolerance  REASON FOR ASSESSMENT:   Consult, Ventilator Enteral/tube feeding initiation and management  ASSESSMENT:   Pt with PMH of obesity and DM admitted 9/14 for COVID-19 PNA.   Pt became hypoxic and was intubated 9/16.   Patient is currently intubated on ventilator support MV: 14.7 L/min Temp (24hrs), Avg:98.3 F (36.8 C), Min:97 F (36.1 C), Max:100 F (37.8 C)  Medications reviewed and include: decadron, lasix, SSI, levemir, remdesivir  Labs reviewed    NUTRITION - FOCUSED PHYSICAL EXAM:  Deferred   Diet Order:   Diet Order            Diet NPO time specified  Diet effective now              EDUCATION NEEDS:   No education needs have been identified at this time  Skin:  Skin Assessment: Reviewed RN Assessment  Last BM:  unknown  Height:   Ht Readings from Last 1 Encounters:  10/14/18 5\' 3"  (1.6 m)    Weight:   Wt Readings from Last 1 Encounters:  10/14/18 92.3 kg    Ideal Body Weight:  56.3 kg  BMI:  Body mass index is 36.05 kg/m.  Estimated Nutritional Needs:   Kcal:  1300  Protein:  140 grams  Fluid:  > 1.5 L/day  Maylon Peppers RD, LDN, CNSC [phone] Pager [phone] After Hours Pager

## 2018-10-14 NOTE — Progress Notes (Signed)
PROGRESS NOTE  Thomas Gill KZS:[id number] DOB: 26-Nov-1955 DOA: 10/05/2018 PCP: Patient, No Pcp Per   LOS: 3 days   Brief Narrative / Interim history: Thomas Gill is an 63 y.o. male diagnosed with COVID-19 last week test on 10/06/2018 was positive, presents to the ED with progressive shortness of breath, cough and diarrhea.  When EMS arrived at his house he was satting in the 60s on room air.  Subjective: -intubated overnight, sedated   Assessment & Plan: Active Problems:   COVID-19 virus infection   Acute respiratory failure with hypoxia (HCC)   AKI (acute kidney injury) ([place])  Principal Problem Acute hypoxic respiratory failure due to Covid 19 pneumonia -Now had to be intubated last night, currently significantly hypoxic requiring 100% FiO2 and 18 of PEEP.  Sedation/vent management per PCCM  Vent Mode: PCV FiO2 (%):  [90 %-100 %] 100 % Set Rate:  [16 bmp-18 bmp] 18 bmp Vt Set:  [450 mL] 450 mL PEEP:  [15 cmH20-18 cmH20] 18 cmH20 Plateau Pressure:  [25 cmH20-28 cmH20] 26 cmH20  -Remdesevir started on 9/14, will do 5 days, today day 4/5 -continue decadron, monitor inflammatory markers  COVID-19 Labs  Recent Labs    10/14/18 0435  DDIMER >20.00*  FERRITIN 172  LDH 670*  CRP 13.0*    Lab Results  Component Value Date   SARSCOV2NAA POSITIVE (A) 10/17/2018   Active Problems DM2 with hyperglycemia -continue insulin, hold home agents, CBGs controlled as below  CBG (last 3)  Recent Labs    10/13/18 2136 10/14/18 0532 10/14/18 0753  GLUCAP 112* 145* 161*    Scheduled Meds: . artificial tears  1 application Both Eyes D3U  . chlorhexidine gluconate (MEDLINE KIT)  15 mL Mouth Rinse BID  . Chlorhexidine Gluconate Cloth  6 each Topical Daily  . dexamethasone (DECADRON) injection  6 mg Intravenous Q12H  . enoxaparin (LOVENOX) injection  45 mg Subcutaneous Q12H  . famotidine  20 mg Oral BID  . furosemide  40 mg Intravenous Q8H  . insulin aspart  0-20 Units  Subcutaneous Q4H  . insulin detemir  10 Units Subcutaneous BID  . mouth rinse  15 mL Mouth Rinse 10 times per day   Continuous Infusions: . sodium chloride 10 mL/hr at 10/14/18 0700  . fentaNYL infusion INTRAVENOUS    . midazolam    . norepinephrine (LEVOPHED) Adult infusion    . remdesivir 100 mg in NS 250 mL Stopped (10/13/18 1759)   PRN Meds:.  DVT prophylaxis: Lovenox Code Status: Full code Family Communication:  Disposition Plan: remain in ICU  Consultants:   PCCM  Procedures:   Intubation 9/17  Antimicrobials:  None    Objective: Vitals:   10/14/18 0859 10/14/18 0900 10/14/18 0912 10/14/18 1000  BP:  111/66  (!) 86/60  Pulse:  (!) 59  (!) 53  Resp:  20  15  Temp:  (!) 97.5 F (36.4 C)  98.4 F (36.9 C)  TempSrc:      SpO2: (!) 82% 90% (!) 89% 91%  Weight:      Height:        Intake/Output Summary (Last 24 hours) at 10/14/2018 1359 Last data filed at 10/14/2018 0828 Gross per 24 hour  Intake 936.13 ml  Output 610 ml  Net 326.13 ml   Filed Weights   10/27/2018 1207 10/13/18 0433 10/14/18 0500  Weight: 93 kg 92.4 kg 92.3 kg    Examination:  Constitutional: Sedated, on the vent ENMT: mmm, ETT in place Neck: normal,  supple Respiratory: Coarse sounds bilaterally, no wheezing, breathing with the vent Cardiovascular: Regular rate and rhythm, no murmurs.  No peripheral edema Abdomen: Soft, nontender, nondistended, bowel sounds positive Musculoskeletal: no clubbing / cyanosis.  Skin: No rashes seen Neurologic: No focal deficits  Data Reviewed: I have independently reviewed following labs and imaging studies   CBC: Recent Labs  Lab 10/10/2018 1126 10/09/2018 1415 10/12/18 1323 10/13/18 0420 10/14/18 0135 10/14/18 0435 10/14/18 1050  WBC 8.6 8.6 11.7* 11.2*  --  12.2*  --   NEUTROABS 8.3*  --   --   --   --   --   --   HGB 14.9 14.4 13.7 13.5 14.6 14.4 14.6  HCT 44.1 43.0 41.7 41.7 43.0 45.9 43.0  MCV 91.3 91.9 92.5 94.3  --  96.6  --   PLT 389  341 251 246  --  187  --    Basic Metabolic Panel: Recent Labs  Lab 10/10/2018 1126 10/17/2018 1415 10/12/18 1323 10/13/18 0420 10/14/18 0135 10/14/18 0435 10/14/18 1050  NA 131*  --  133* 134* 135 137 137  K 4.1  --  3.9 4.3 3.8 4.6 5.2*  CL 90*  --  93* 94*  --  97*  --   CO2 22  --  28 29  --  29  --   GLUCOSE 285*  --  289* 216*  --  150*  --   BUN 36*  --  30* 28*  --  22*  --   CREATININE 1.29* 1.07 0.81 0.85  --  0.85  --   CALCIUM 8.2*  --  8.4* 8.5*  --  8.1*  --    GFR: Estimated Creatinine Clearance: 92.9 mL/min (by C-G formula based on SCr of 0.85 mg/dL). Liver Function Tests: Recent Labs  Lab 10/02/2018 1126 10/12/18 1323 10/13/18 0420 10/14/18 0435  AST 64* 65* 49* 60*  ALT 65* 60* 57* 64*  ALKPHOS 91 84 83 108  BILITOT 1.0 0.2* 0.4 0.6  PROT 6.9 6.5 6.3* 6.5  ALBUMIN 2.3* 2.4* 2.4* 2.5*   No results for input(s): LIPASE, AMYLASE in the last 168 hours. No results for input(s): AMMONIA in the last 168 hours. Coagulation Profile: No results for input(s): INR, PROTIME in the last 168 hours. Cardiac Enzymes: No results for input(s): CKTOTAL, CKMB, CKMBINDEX, TROPONINI in the last 168 hours. BNP (last 3 results) No results for input(s): PROBNP in the last 8760 hours. HbA1C: Recent Labs    09/28/2018 1430  HGBA1C 9.1*   CBG: Recent Labs  Lab 10/13/18 1218 10/13/18 1716 10/13/18 2136 10/14/18 0532 10/14/18 0753  GLUCAP 217* 237* 112* 145* 161*   Lipid Profile: No results for input(s): CHOL, HDL, LDLCALC, TRIG, CHOLHDL, LDLDIRECT in the last 72 hours. Thyroid Function Tests: No results for input(s): TSH, T4TOTAL, FREET4, T3FREE, THYROIDAB in the last 72 hours. Anemia Panel: Recent Labs    10/14/18 0435  FERRITIN 172   Urine analysis:    Component Value Date/Time   COLORURINE yellow 12/22/2007 0826   APPEARANCEUR Clear 12/22/2007 0826   LABSPEC 1.025 12/22/2007 0826   PHURINE 6.0 12/22/2007 0826   HGBUR negative 12/22/2007 0826   BILIRUBINUR  negative 12/12/2014 1241   KETONESUR negative 12/12/2014 1241   PROTEINUR negative 12/12/2014 1241   UROBILINOGEN 0.2 12/12/2014 1241   UROBILINOGEN 0.2 12/22/2007 0826   NITRITE Negative 12/12/2014 1241   NITRITE negative 12/22/2007 0826   LEUKOCYTESUR Negative 12/12/2014 1241   Sepsis Labs: Invalid input(s): PROCALCITONIN,  LACTICIDVEN  Recent Results (from the past 240 hour(s))  SARS Coronavirus 2 [facility] order, Performed in [facility] hospital lab) Nasopharyngeal Nasopharyngeal Swab     Status: Abnormal   Collection Time: 10/15/2018 11:30 AM   Specimen: Nasopharyngeal Swab  Result Value Ref Range Status   SARS Coronavirus 2 POSITIVE (A) NEGATIVE Final    Comment: RESULT CALLED TO, READ BACK BY AND VERIFIED WITH: RN H HARDY AT 1304 10/12/2018 BY L BENFIELD (NOTE) If result is NEGATIVE SARS-CoV-2 target nucleic acids are NOT DETECTED. The SARS-CoV-2 RNA is generally detectable in upper and lower  respiratory specimens during the acute phase of infection. The lowest  concentration of SARS-CoV-2 viral copies this assay can detect is 250  copies / mL. A negative result does not preclude SARS-CoV-2 infection  and should not be used as the sole basis for treatment or other  patient management decisions.  A negative result may occur with  improper specimen collection / handling, submission of specimen other  than nasopharyngeal swab, presence of viral mutation(s) within the  areas targeted by this assay, and inadequate number of viral copies  (<250 copies / mL). A negative result must be combined with clinical  observations, patient history, and epidemiological information. If result is POSITIVE SARS-CoV-2 target nucleic acids are DETEC TED. The SARS-CoV-2 RNA is generally detectable in upper and lower  respiratory specimens during the acute phase of infection.  Positive  results are indicative of active infection with SARS-CoV-2.  Clinical  correlation with patient history and  other diagnostic information is  necessary to determine patient infection status.  Positive results do  not rule out bacterial infection or co-infection with other viruses. If result is PRESUMPTIVE POSTIVE SARS-CoV-2 nucleic acids MAY BE PRESENT.   A presumptive positive result was obtained on the submitted specimen  and confirmed on repeat testing.  While 2019 novel coronavirus  (SARS-CoV-2) nucleic acids may be present in the submitted sample  additional confirmatory testing may be necessary for epidemiological  and / or clinical management purposes  to differentiate between  SARS-CoV-2 and other Sarbecovirus currently known to infect humans.  If clinically indicated additional testing with an alternate test  methodology (LAB7 453) is advised. The SARS-CoV-2 RNA is generally  detectable in upper and lower respiratory specimens during the acute  phase of infection. The expected result is Negative. Fact Sheet for Patients:  StrictlyIdeas.no Fact Sheet for Healthcare Providers: BankingDealers.co.za This test is not yet approved or cleared by the [place] FDA and has been authorized for detection and/or diagnosis of SARS-CoV-2 by FDA under an Emergency Use Authorization (EUA).  This EUA will remain in effect (meaning this test can be used) for the duration of the COVID-19 declaration under Section 564(b)(1) of the Act, 21 U.S.C. section 360bbb-3(b)(1), unless the authorization is terminated or revoked sooner. Performed at [facility], [place] [address], [place], [place] [address]   MRSA PCR Screening     Status: None   Collection Time: 10/12/18  9:05 PM   Specimen: Nasal Mucosa; Nasopharyngeal  Result Value Ref Range Status   MRSA by PCR NEGATIVE NEGATIVE Final    Comment:        The GeneXpert MRSA Assay (FDA approved for NASAL specimens only), is one component of a comprehensive MRSA colonization surveillance program. It is  not intended to diagnose MRSA infection nor to guide or monitor treatment for MRSA infections. Performed at [facility], [place] [address], [place], [place] [address]  Radiology Studies: Dg Chest Port 1 View  Result Date: 10/14/2018 CLINICAL DATA:  63 year old male intubated. COVID-19. EXAM: PORTABLE CHEST 1 VIEW COMPARISON:  10/04/2018 portable chest. FINDINGS: Portable AP semi upright view at 0059 hours. Endotracheal tube tip in good position just below the clavicles. Enteric tube courses to the abdomen and the tip is at the level of the gastric body. Larger lung volumes. Normal cardiac size and mediastinal contours. Patchy and indistinct bilateral mid and lower lung opacity appears greater on the left and mildly increased. No pneumothorax or pleural effusion. Negative visible bowel gas pattern. No acute osseous abnormality identified. IMPRESSION: 1. Endotracheal tube and enteric tube in good position. 2. Larger lung volumes. Bilateral COVID-19 pneumonia stable to mildly progressed since 10/15/2018. Electronically Signed   By: Genevie Ann M.D.   On: 10/14/2018 01:30   Marzetta Board, MD, PhD [facility]  Contact via  [email or web address]  [place] P: [phone] F: [phone]

## 2018-10-14 NOTE — Progress Notes (Signed)
Pt's friend, William Hamburger, called. Updated of pt condition. Per pt's friend, pt's family is in [place] and he is the only available contact person in the [place]. Pt's friend states that he will update pt's family members in [place]. Pt's friend appreciative of update.

## 2018-10-14 NOTE — Progress Notes (Signed)
5 mL of Versed wasted. Witnessed by Denyse Amass RN.

## 2018-10-14 NOTE — Progress Notes (Addendum)
Night shift ICU coverage note.  The patient was seen due to persistent restlessness, dyspnea and tachypnea which did not respond to positioning or mild sedation. His oxygen saturation have been fluctuating in the 70's to mid 80's earlier during day shift. When seen at bedside, the patient was very restless despite being given 0.75 mg IVP or lorazepam. He was asked in Spanish to breath slowly and deep, which he was able to do briefly, but was unable to sustain. He stated that he was unable to breath and thought he was going to die. He became transiently tachycardic and his BP rose to the 190s and 120s. He agreed to have ET intubation since he thought he was unable to breath on his own. CRNA, who was on stand by proceeded to intubate Mr. Thomas Gill after giving Rocuronium, Midazolam and etomidate. The patient was intubated without any difficulty.   After the Rocuronium action decreased the patient experienced hypoxia in the high 70's and low to mid 80s due to mechanical ventilation asynchrony. This improved after Vecuronium boluses were added.   10/14/18 0300  97.9 F (36.6 C)  70  70  17  127/73  -  97 %  -  - ER   10/14/18 0215  -  70  71  16  126/79  -  97 %  -  - CH   10/14/18 0200  -  69  70  16  122/72  -  97 %  -  - CH   10/14/18 0145  -  56Abnormal   57Abnormal   19  -  -  89 %Abnormal   -  - ER   10/14/18 0130  -  65  67  26Abnormal   99/54Abnormal   -  80 %Abnormal   -  - ER   10/14/18 0115  -  73  73  23Abnormal   120/66  -  84 %Abnormal   -  - ER   10/14/18 0100  -  83  86  25Abnormal   179/125Abnormal   -  95 %  -  - ER   10/14/18 0048  -  -  -  -  -  -  86 %Abnormal   -  - TE   10/14/18 0045  -  81  80  31Abnormal   211/187Abnormal   -  93 %  -  - ER   10/14/18 0030  -  64  65  19  -  -  87 %Abnormal   -  - ER   10/14/18 0020  -  86  90  33Abnormal   -  -  41 %Abnormal    -  - ER   10/14/18 0015  -  102Abnormal   101Abnormal   27Abnormal   -  -  70 %Abnormal   -  - ER   10/14/18 0000  98 F  (36.7 C)  59Abnormal   61  41Abnormal   97/58Abnormal   Lying  82 %Abnormal   Other (Comment)  - ER   10/13/18 2300  -  61  59Abnormal   48Abnormal   107/74  -  78 %Abnormal   -  - ER   10/13/18 2200  -  60  60  17  128/86  -  84 %Abnormal   -  - ER    General: Very anxious and restless. Oriented x2. HEENT: normocephalic, no icterus. Neck: Supple, No JVD. Lungs: Diminished with  scattered crackles. CV: S1S2, RRR. Abdomen: OBese, Soft, NT. Extremities: No edema, clubbing and cyanosis. Neuro: Grossly non-focal.   Component Value Units  I-STAT 7, (LYTES, BLD GAS, ICA, H+H) [[id number]] (Abnormal)   Collected: 10/14/18 0135   Updated: 10/14/18 0137    pH, Arterial 7.374   pCO2 arterial 51.9High  mmHg   pO2, Arterial 47.0Low  mmHg   Bicarbonate 30.3High  mmol/L   TCO2 32 mmol/L   O2 Saturation 81.0 %   Acid-Base Excess 4.0High  mmol/L   Sodium 135 mmol/L   Potassium 3.8 mmol/L   Calcium, Ion 1.15 mmol/L   HCT 43.0 %   Hemoglobin 14.6 g/dL   Patient temperature 98.6 F   Collection site RADIAL, ALLEN'S TEST ACCEPTABLE   Drawn by RT   Sample type ARTERIAL    PORTABLE CHEST 1 VIEW  COMPARISON:  10-30-2018 portable chest.  FINDINGS: Portable AP semi upright view at 0059 hours. Endotracheal tube tip in good position just below the clavicles. Enteric tube courses to the abdomen and the tip is at the level of the gastric body.  Larger lung volumes. Normal cardiac size and mediastinal contours. Patchy and indistinct bilateral mid and lower lung opacity appears greater on the left and mildly increased. No pneumothorax or pleural effusion. Negative visible bowel gas pattern. No acute osseous abnormality identified.  IMPRESSION: 1. Endotracheal tube and enteric tube in good position. 2. Larger lung volumes. Bilateral COVID-19 pneumonia stable to mildly progressed since Oct 30, 2018.  Electronically Signed   By: Genevie Ann M.D.   On: 10/14/2018 01:30  Acute respiratory failure  with hypoxia. ARDS due to COVID-19 pneumonia. Continue mechanical ventilation. Had to be started on paralytic boluses due to vent asynchrony. Continue current treatment and all other measures. Staff will ask Elink to review if there is a need for a paralytic infusion.  Family communication: Attempted to call listed contact William Hamburger at [phone] (left message) his home phone at [phone] answer or voicemail). I will try again later this morning.  Tennis Must, MD  Over 60 minutes of critical care time were spent during the process of this emergent event.

## 2018-10-14 NOTE — Anesthesia Preprocedure Evaluation (Signed)
Anesthesia Evaluation  Patient identified by MRN, date of birth, ID band Patient confused  Preop documentation limited or incomplete due to emergent nature of procedure.  Airway Mallampati: III  TM Distance: >3 FB Neck ROM: Full    Dental no notable dental hx.    Pulmonary former smoker,  covid ARDS   Pulmonary exam normal        Cardiovascular negative cardio ROS Normal cardiovascular exam     Neuro/Psych negative neurological ROS  negative psych ROS   GI/Hepatic negative GI ROS, Neg liver ROS,   Endo/Other  negative endocrine ROSdiabetes, Type 2, Insulin Dependent, Oral Hypoglycemic Agents  Renal/GU   negative genitourinary   Musculoskeletal negative musculoskeletal ROS (+)   Abdominal (+) + obese,   Peds  Hematology negative hematology ROS (+)   Anesthesia Other Findings   Reproductive/Obstetrics negative OB ROS                             Anesthesia Physical Anesthesia Plan  ASA: IV and emergent  Anesthesia Plan: General   Post-op Pain Management:    Induction: Rapid sequence and Intravenous  PONV Risk Score and Plan:   Airway Management Planned: Oral ETT  Additional Equipment:   Intra-op Plan:   Post-operative Plan: Post-operative intubation/ventilation  Informed Consent:     Only emergency history available  Plan Discussed with: CRNA  Anesthesia Plan Comments:         Anesthesia Quick Evaluation

## 2018-10-14 NOTE — Progress Notes (Signed)
Cardiac arrest event:  Alerted by patient's RN that patient was asystole. Initiated Code [place] 2253. No pulse at this time  2 amps of epi given 2 amps of bicarb given Pulse return at 2302. ST/afib on monitor. ROSC   Central line placed by MD Olevia Bowens at bedside. Family called and updated via MD.

## 2018-10-14 NOTE — Progress Notes (Signed)
Night shift ICU coverage note.  Attempted again to call listed contact William Hamburger at [phone] and left message another voicemail message to contact us back as soon as he can.   Tennis Must, MD

## 2018-10-14 NOTE — Progress Notes (Signed)
Pt transferred to proning bed assisted by RNs and RTs. Pt put into a proning position. Tolerated well. No s/s of distress at this time.

## 2018-10-15 ENCOUNTER — Inpatient Hospital Stay (HOSPITAL_COMMUNITY): Payer: Medicaid Other

## 2018-10-15 DIAGNOSIS — I2699 Other pulmonary embolism without acute cor pulmonale: Secondary | ICD-10-CM | POA: Diagnosis not present

## 2018-10-15 DIAGNOSIS — J80 Acute respiratory distress syndrome: Secondary | ICD-10-CM | POA: Diagnosis not present

## 2018-10-15 DIAGNOSIS — K7200 Acute and subacute hepatic failure without coma: Secondary | ICD-10-CM | POA: Diagnosis not present

## 2018-10-15 DIAGNOSIS — J9601 Acute respiratory failure with hypoxia: Secondary | ICD-10-CM | POA: Diagnosis not present

## 2018-10-15 DIAGNOSIS — U071 COVID-19: Secondary | ICD-10-CM

## 2018-10-15 DIAGNOSIS — I469 Cardiac arrest, cause unspecified: Secondary | ICD-10-CM | POA: Diagnosis not present

## 2018-10-15 LAB — CBC WITH DIFFERENTIAL/PLATELET
Abs Immature Granulocytes: 1.42 10*3/uL — ABNORMAL HIGH (ref 0.00–0.07)
Basophils Absolute: 0.2 10*3/uL — ABNORMAL HIGH (ref 0.0–0.1)
Basophils Relative: 1 %
Eosinophils Absolute: 0.1 10*3/uL (ref 0.0–0.5)
Eosinophils Relative: 0 %
HCT: 45.6 % (ref 39.0–52.0)
Hemoglobin: 12.8 g/dL — ABNORMAL LOW (ref 13.0–17.0)
Immature Granulocytes: 7 %
Lymphocytes Relative: 26 %
Lymphs Abs: 5 10*3/uL — ABNORMAL HIGH (ref 0.7–4.0)
MCH: 30.7 pg (ref 26.0–34.0)
MCHC: 28.1 g/dL — ABNORMAL LOW (ref 30.0–36.0)
MCV: 109.4 fL — ABNORMAL HIGH (ref 80.0–100.0)
Monocytes Absolute: 0.4 10*3/uL (ref 0.1–1.0)
Monocytes Relative: 2 %
Neutro Abs: 12.7 10*3/uL — ABNORMAL HIGH (ref 1.7–7.7)
Neutrophils Relative %: 64 %
Platelets: 209 10*3/uL (ref 150–400)
RBC: 4.17 MIL/uL — ABNORMAL LOW (ref 4.22–5.81)
RDW: 13.9 % (ref 11.5–15.5)
WBC: 19.7 10*3/uL — ABNORMAL HIGH (ref 4.0–10.5)
nRBC: 4.3 % — ABNORMAL HIGH (ref 0.0–0.2)

## 2018-10-15 LAB — HEPATIC FUNCTION PANEL
ALT: 101 U/L — ABNORMAL HIGH (ref 0–44)
AST: 139 U/L — ABNORMAL HIGH (ref 15–41)
Albumin: 2.1 g/dL — ABNORMAL LOW (ref 3.5–5.0)
Alkaline Phosphatase: 122 U/L (ref 38–126)
Bilirubin, Direct: 0.4 mg/dL — ABNORMAL HIGH (ref 0.0–0.2)
Indirect Bilirubin: 0.4 mg/dL (ref 0.3–0.9)
Total Bilirubin: 0.8 mg/dL (ref 0.3–1.2)
Total Protein: 5.7 g/dL — ABNORMAL LOW (ref 6.5–8.1)

## 2018-10-15 LAB — POCT I-STAT 7, (LYTES, BLD GAS, ICA,H+H)
Acid-Base Excess: 2 mmol/L (ref 0.0–2.0)
Acid-base deficit: 1 mmol/L (ref 0.0–2.0)
Bicarbonate: 30 mmol/L — ABNORMAL HIGH (ref 20.0–28.0)
Bicarbonate: 30.5 mmol/L — ABNORMAL HIGH (ref 20.0–28.0)
Bicarbonate: 31.3 mmol/L — ABNORMAL HIGH (ref 20.0–28.0)
Calcium, Ion: 1.05 mmol/L — ABNORMAL LOW (ref 1.15–1.40)
Calcium, Ion: 1.07 mmol/L — ABNORMAL LOW (ref 1.15–1.40)
Calcium, Ion: 1.04 mmol/L — ABNORMAL LOW (ref 1.15–1.40)
HCT: 45 % (ref 39.0–52.0)
HCT: 42 % (ref 39.0–52.0)
HCT: 40 % (ref 39.0–52.0)
Hemoglobin: 15.3 g/dL (ref 13.0–17.0)
Hemoglobin: 14.3 g/dL (ref 13.0–17.0)
Hemoglobin: 13.6 g/dL (ref 13.0–17.0)
O2 Saturation: 99 %
O2 Saturation: 98 %
O2 Saturation: 100 %
Patient temperature: 37.2
Patient temperature: 37.1
Patient temperature: 37.1
Potassium: 5.2 mmol/L — ABNORMAL HIGH (ref 3.5–5.1)
Potassium: 5.2 mmol/L — ABNORMAL HIGH (ref 3.5–5.1)
Potassium: 5.1 mmol/L (ref 3.5–5.1)
Sodium: 139 mmol/L (ref 135–145)
Sodium: 142 mmol/L (ref 135–145)
Sodium: 139 mmol/L (ref 135–145)
TCO2: 32 mmol/L (ref 22–32)
TCO2: 33 mmol/L — ABNORMAL HIGH (ref 22–32)
TCO2: 33 mmol/L — ABNORMAL HIGH (ref 22–32)
pCO2 arterial: 76.4 mmHg (ref 32.0–48.0)
pCO2 arterial: 91.4 mmHg (ref 32.0–48.0)
pCO2 arterial: 71.5 mmHg (ref 32.0–48.0)
pH, Arterial: 7.204 — ABNORMAL LOW (ref 7.350–7.450)
pH, Arterial: 7.132 — CL (ref 7.350–7.450)
pH, Arterial: 7.25 — ABNORMAL LOW (ref 7.350–7.450)
pO2, Arterial: 171 mmHg — ABNORMAL HIGH (ref 83.0–108.0)
pO2, Arterial: 145 mmHg — ABNORMAL HIGH (ref 83.0–108.0)
pO2, Arterial: 283 mmHg — ABNORMAL HIGH (ref 83.0–108.0)

## 2018-10-15 LAB — CBC
HCT: 45.3 % (ref 39.0–52.0)
Hemoglobin: 13.4 g/dL (ref 13.0–17.0)
MCH: 30.4 pg (ref 26.0–34.0)
MCHC: 29.6 g/dL — ABNORMAL LOW (ref 30.0–36.0)
MCV: 102.7 fL — ABNORMAL HIGH (ref 80.0–100.0)
Platelets: 192 10*3/uL (ref 150–400)
RBC: 4.41 MIL/uL (ref 4.22–5.81)
RDW: 13.6 % (ref 11.5–15.5)
WBC: 20.7 10*3/uL — ABNORMAL HIGH (ref 4.0–10.5)
nRBC: 3 % — ABNORMAL HIGH (ref 0.0–0.2)

## 2018-10-15 LAB — BASIC METABOLIC PANEL
Anion gap: 13 (ref 5–15)
Anion gap: 16 — ABNORMAL HIGH (ref 5–15)
BUN: 34 mg/dL — ABNORMAL HIGH (ref 6–20)
BUN: 49 mg/dL — ABNORMAL HIGH (ref 6–20)
CO2: 34 mmol/L — ABNORMAL HIGH (ref 22–32)
CO2: 26 mmol/L (ref 22–32)
Calcium: 8.3 mg/dL — ABNORMAL LOW (ref 8.9–10.3)
Calcium: 7.1 mg/dL — ABNORMAL LOW (ref 8.9–10.3)
Chloride: 98 mmol/L (ref 98–111)
Chloride: 99 mmol/L (ref 98–111)
Creatinine, Ser: 2.26 mg/dL — ABNORMAL HIGH (ref 0.61–1.24)
Creatinine, Ser: 2.72 mg/dL — ABNORMAL HIGH (ref 0.61–1.24)
GFR calc Af Amer: 35 mL/min — ABNORMAL LOW (ref >60)
GFR calc Af Amer: 28 mL/min — ABNORMAL LOW (ref >60)
GFR calc non Af Amer: 30 mL/min — ABNORMAL LOW (ref >60)
GFR calc non Af Amer: 24 mL/min — ABNORMAL LOW (ref >60)
Glucose, Bld: 206 mg/dL — ABNORMAL HIGH (ref 70–99)
Glucose, Bld: 334 mg/dL — ABNORMAL HIGH (ref 70–99)
Potassium: 5.8 mmol/L — ABNORMAL HIGH (ref 3.5–5.1)
Potassium: 4.8 mmol/L (ref 3.5–5.1)
Sodium: 145 mmol/L (ref 135–145)
Sodium: 141 mmol/L (ref 135–145)

## 2018-10-15 LAB — HEPARIN LEVEL (UNFRACTIONATED): Heparin Unfractionated: 1.04 IU/mL — ABNORMAL HIGH (ref 0.30–0.70)

## 2018-10-15 LAB — GLUCOSE, CAPILLARY
Glucose-Capillary: 227 mg/dL — ABNORMAL HIGH (ref 70–99)
Glucose-Capillary: 317 mg/dL — ABNORMAL HIGH (ref 70–99)
Glucose-Capillary: 320 mg/dL — ABNORMAL HIGH (ref 70–99)
Glucose-Capillary: 329 mg/dL — ABNORMAL HIGH (ref 70–99)
Glucose-Capillary: 271 mg/dL — ABNORMAL HIGH (ref 70–99)
Glucose-Capillary: 229 mg/dL — ABNORMAL HIGH (ref 70–99)

## 2018-10-15 LAB — TROPONIN I (HIGH SENSITIVITY)
Troponin I (High Sensitivity): 247 ng/L (ref <18)
Troponin I (High Sensitivity): 308 ng/L (ref <18)

## 2018-10-15 LAB — MAGNESIUM
Magnesium: 3.3 mg/dL — ABNORMAL HIGH (ref 1.7–2.4)
Magnesium: 2.5 mg/dL — ABNORMAL HIGH (ref 1.7–2.4)
Magnesium: 2.5 mg/dL — ABNORMAL HIGH (ref 1.7–2.4)

## 2018-10-15 LAB — PHOSPHORUS
Phosphorus: 8.7 mg/dL — ABNORMAL HIGH (ref 2.5–4.6)
Phosphorus: 6.6 mg/dL — ABNORMAL HIGH (ref 2.5–4.6)

## 2018-10-15 LAB — LACTIC ACID, PLASMA: Lactic Acid, Venous: 3.4 mmol/L (ref 0.5–1.9)

## 2018-10-15 MED ORDER — FENTANYL BOLUS VIA INFUSION: 50.0000 ug | INTRAVENOUS | Status: DC | PRN

## 2018-10-15 MED ORDER — VECURONIUM BOLUS VIA INFUSION
0.0800 mg/kg | Freq: Once | INTRAVENOUS | Status: AC
  Administered 2018-10-15: 7.4 mg via INTRAVENOUS
  Filled 2018-10-15: qty 8

## 2018-10-15 MED ORDER — HEPARIN (PORCINE) 25000 UT/250ML-% IV SOLN
1300.0000 [IU]/h | INTRAVENOUS | Status: DC
  Administered 2018-10-15: 1300 [IU]/h via INTRAVENOUS
  Filled 2018-10-15: qty 250

## 2018-10-15 MED ORDER — FENTANYL CITRATE (PF) 100 MCG/2ML IJ SOLN: 50.0000 ug | Freq: Once | INTRAMUSCULAR | Status: DC

## 2018-10-15 MED ORDER — ARTIFICIAL TEARS OPHTHALMIC OINT: 1.0000 "application " | TOPICAL_OINTMENT | Freq: Three times a day (TID) | OPHTHALMIC | Status: DC

## 2018-10-15 MED ORDER — LACTATED RINGERS IV SOLN
INTRAVENOUS | Status: DC
  Administered 2018-10-15 – 2018-10-18 (×6): via INTRAVENOUS

## 2018-10-15 MED ORDER — FAMOTIDINE 20 MG PO TABS
20.0000 mg | ORAL_TABLET | Freq: Every day | ORAL | Status: DC
  Administered 2018-10-16 – 2018-10-18 (×3): 20 mg via ORAL
  Filled 2018-10-15 (×3): qty 1

## 2018-10-15 MED ORDER — FENTANYL 2500MCG IN NS 250ML (10MCG/ML) PREMIX INFUSION: 50.0000 ug/h | INTRAVENOUS | Status: DC

## 2018-10-15 MED ORDER — HEPARIN (PORCINE) 25000 UT/250ML-% IV SOLN
950.0000 [IU]/h | INTRAVENOUS | Status: DC
  Administered 2018-10-15: 1100 [IU]/h via INTRAVENOUS
  Administered 2018-10-16: 950 [IU]/h via INTRAVENOUS
  Filled 2018-10-15: qty 250

## 2018-10-15 MED ORDER — SODIUM CHLORIDE 0.9 % IV BOLUS
1000.0000 mL | Freq: Once | INTRAVENOUS | Status: AC
  Administered 2018-10-15: 1000 mL via INTRAVENOUS

## 2018-10-15 MED ORDER — MIDAZOLAM 50MG/50ML (1MG/ML) PREMIX INFUSION: 2.0000 mg/h | INTRAVENOUS | Status: DC

## 2018-10-15 MED ORDER — HEPARIN BOLUS VIA INFUSION
2000.0000 [IU] | Freq: Once | INTRAVENOUS | Status: AC
  Administered 2018-10-15: 2000 [IU] via INTRAVENOUS
  Filled 2018-10-15: qty 2000

## 2018-10-15 MED ORDER — MIDAZOLAM BOLUS VIA INFUSION
1.0000 mg | INTRAVENOUS | Status: DC | PRN
  Filled 2018-10-15: qty 2

## 2018-10-15 MED ORDER — SODIUM CHLORIDE 0.9 % IV SOLN: INTRAVENOUS | Status: DC

## 2018-10-15 MED ORDER — VECURONIUM BROMIDE 10 MG IV SOLR
0.0000 ug/kg/min | INTRAVENOUS | Status: DC
  Administered 2018-10-15: 1 ug/kg/min via INTRAVENOUS
  Administered 2018-10-17: 0.7 ug/kg/min via INTRAVENOUS
  Filled 2018-10-15: qty 100
  Filled 2018-10-15: qty 80
  Filled 2018-10-15 (×3): qty 100

## 2018-10-15 NOTE — Progress Notes (Signed)
ANTICOAGULATION CONSULT NOTE - Initial Consult  Pharmacy Consult for Heparin Indication: pulmonary embolus  No Known Allergies  Patient Measurements: Height: 5\' 3"  (160 cm) Weight: 203 lb 7.8 oz (92.3 kg) IBW/kg (Calculated) : 56.9 Heparin Dosing Weight: 75  Vital Signs: Temp: 99.9 F (37.7 C) (09/18 0800) Temp Source: Axillary (09/18 0015) BP: 137/82 (09/18 0818) Pulse Rate: 67 (09/18 0818)  Labs: Recent Labs    10/14/18 0435  10/14/18 2325 10/15/18 0335 10/15/18 0440  HGB 14.4   < > 12.8* 15.3 13.4  HCT 45.9   < > 45.6 45.0 45.3  PLT 187  --  209  --  192  CREATININE 0.85  --  2.26*  --  2.72*  TROPONINIHS  --   --  247*  --  308*   < > = values in this interval not displayed.    Estimated Creatinine Clearance: 29 mL/min (A) (by C-G formula based on SCr of 2.72 mg/dL (H)).   Medical History: Past Medical History:  Diagnosis Date  . Allergy   . Diabetes mellitus type 2 in obese [facility])     Assessment: Thomas Gill is a 63 y.o. male presenting on 9/14 with worsening O2 saturation, cough, and diarrhea. Patient was diagnosed with covid last week. On 9/17, patient decompensated requiring intubation. Last night patient had a cardiorespiratory arrest/code blue event that lasted approximately 9 minutes until achieving ROSC.  Patient was not any PTA anticoagulation. When admitted, he was started on anticoagulation ppx  (Enoxparin SQ 0.5 mg/kg q12h). Given recent events, possible PE for cause of rapid decompensation. Starting empiric heparin while further diagnostics are completed. Hbg 13.4/Hct 45.3, plt 192. D-Dimer elevated at > 20,000. Troponin 247 >> 308. No concerns for bleeding.    Goal of Therapy:  Heparin level 0.3-0.7 units/ml Monitor platelets by anticoagulation protocol: Yes   Plan:   Give heparin IV 2000 unit bolus; then start heparin IV gtt 1300 units/hr   - Obtain 8 hour HL   - Monitor daily HL, CBC, and s/sx of bleeding   Acey Lav, PharmD  PGY1  Acute Care Pharmacy Resident 10/15/2018,10:10 AM

## 2018-10-15 NOTE — Progress Notes (Signed)
ANTICOAGULATION CONSULT NOTE - Follow Up Consult  Pharmacy Consult for Heparin Indication: pulmonary embolus  No Known Allergies  Patient Measurements: Height: 5\' 3"  (160 cm) Weight: 203 lb 7.8 oz (92.3 kg) IBW/kg (Calculated) : 56.9 Heparin Dosing Weight: 77 kg  Vital Signs: Temp: 98.8 F (37.1 C) (09/18 1900) BP: 131/87 (09/18 1955) Pulse Rate: 67 (09/18 1955)  Labs: Recent Labs    10/14/18 0435  10/14/18 2325  10/15/18 0440 10/15/18 1550 10/15/18 1754 10/15/18 1930  HGB 14.4   < > 12.8*   < > 13.4 14.3 13.6  --   HCT 45.9   < > 45.6   < > 45.3 42.0 40.0  --   PLT 187  --  209  --  192  --   --   --   HEPARINUNFRC  --   --   --   --   --   --   --  1.04*  CREATININE 0.85  --  2.26*  --  2.72*  --   --   --   TROPONINIHS  --   --  247*  --  308*  --   --   --    < > = values in this interval not displayed.    Estimated Creatinine Clearance: 29 mL/min (A) (by C-G formula based on SCr of 2.72 mg/dL (H)).   Assessment: Thomas Gill is a 63 y.o. male presenting on 9/14 with worsening O2 saturation, cough, and diarrhea. Patient was diagnosed with covid last week. On 9/17, patient decompensated requiring intubation. Last night patient had a cardiorespiratory arrest/code blue event that lasted approximately 9 minutes until achieving ROSC.  Patient was not any PTA anticoagulation. When admitted, he was started on anticoagulation ppx  (Enoxparin SQ 0.5 mg/kg q12h). Given recent events, possible PE for cause of rapid decompensation. Starting empiric heparin while further diagnostics are completed.  PM 10/15/18  HL 1.04, supratherapeutic  No bleeding reported  Per RN, level drawn from femoral CVC, heparin infusing left AC  LE doppler positive for VTE  Goal of Therapy:  Heparin level 0.3-0.7 units/ml Monitor platelets by anticoagulation protocol: Yes   Plan:   Hold heparin infusion x 1 hour  After holding, resume heparin infusion at 1100 units/hr   Recheck HL in 8  hours  Daily CBC/HL  Monitor for s/s of bleeding   Napoleon Form 10/15/2018,8:34 PM

## 2018-10-15 NOTE — Progress Notes (Signed)
Results for ADONI, GREENOUGH (MRN [id number]) as of 10/15/2018 10:25  Ref. Range 10/14/2018 16:04 10/14/2018 20:25 10/15/2018 00:56 10/15/2018 03:58 10/15/2018 08:04  Glucose-Capillary Latest Ref Range: 70 - 99 mg/dL 114 (H) 160 (H) 227 (H) 317 (H) 320 (H)  Noted that blood sugars have been greater than 300 mg/dl.   If blood sugars continue to be elevated, recommend increasing Levemir to 15 units BID and continuing Novolog RESISTANT correction scale every 4 hours.   Harvel Ricks RN BSN CDE Diabetes Coordinator Pager: [phone]  8am-5pm

## 2018-10-15 NOTE — Progress Notes (Addendum)
Night shift hospitalist ICU coverage note.  The patient had cardiorespiratory arrest/code blue from 2253 to 2302 requiring CPR, 2 amps of epinephrine and sodium bicarbonate IVP were given to achieve ROSC. He was subsequently the patient was started on a Levophed infusion. Events were discussed with the patient's friend Thomas Gill, who got us in contact with the patient's sister Thomas Gill through conference call. Mr. Thomas Gill also assisted us on a second conference call with his sister and his brother Thomas Gill.  Post arrest vital signs  Date/Time  Temp  Pulse  ECG Heart Rate  Resp  BP  Patient Position (if appropriate)  SpO2  O2 Device  Weight Who   10/15/18 0115  97.7 F (36.5 C)  79  78  22Abnormal   78/52Abnormal   -  98 %  -  - CH   10/15/18 0100  97.3 F (36.3 C)Abnormal   82  82  24Abnormal   94/60  -  99 %  -  - CH   10/15/18 0045  97.2 F (36.2 C)Abnormal   86  86  25Abnormal   101/58Abnormal   -  99 %  -  - CH   10/15/18 0030  97.3 F (36.3 C)Abnormal   91  91  20  111/62  -  100 %  -  - CH   10/15/18 0015  98.3 F (36.8 C)  96  96  19  112/65  -  99 %  -  - CH   10/15/18 0000  -  100  100  21Abnormal   113/60  -  98 %  -  - CH   10/14/18 2345  -  97  97  22Abnormal   117/60  -  98 %  -  - Thomas Gill   10/14/18 2330  -  98  -  22Abnormal   117/60  Lying  99 %  Ventilator  - KD   10/14/18 2330  -  -  90  -  -  -  -  -  - CH   10/14/18 2318  -  -  115Abnormal   -  -  -  -  -  - SK   10/14/18 2315  -  101Abnormal   109Abnormal   18  83/16Abnormal   -  98 %  -  - CH    Vent Settings:  Vent Mode: PCV FiO2 (%):  [90 %-100 %] 100 % Set Rate:  [16 bmp-18 bmp] 18 bmp Vt Set:  [450 mL] 450 mL PEEP:  [15 cmH20-18 cmH20] 18 cmH20 Plateau Pressure:  [25 cmH20-28 cmH20] 26 cmH20  HEENT: Pupils are pin pointed, ET and GT in place, normocephalic, no icterus. Neck: Supple, No JVD. Lungs: Improved air movement from my previous exam. CV: S1S2, tachycardic initially, then RRR. Abdomen: Obese,  Soft, NT. Extremities: No edema, clubbing and cyanosis. Neuro: Currently sedated.   LABS are still pending.  CLINICAL DATA:  63 year old male status post cardiac arrest. COVID-19.  EXAM: PORTABLE CHEST 1 VIEW  COMPARISON:  0059 hours earlier today.  FINDINGS: Portable AP semi upright view at 2328 hours. Endotracheal tube tip is visible at the level the clavicles. Enteric tube courses to the abdomen with side hole at the level of the gastric body. New pacer or resuscitation pads projecting over the central chest.  Some of the right lung apex was not included. Mediastinal contours remain normal. Larger lung volumes and improved bilateral ventilation with decreased  patchy and indistinct bilateral pulmonary opacity. No pneumothorax or pleural effusion identified. No acute rib fracture identified. Negative visible bowel gas pattern.  IMPRESSION: 1. Satisfactory ET tube and enteric tube placement. 2. Larger lung volumes with improved ventilation from earlier today. No new cardiopulmonary abnormality.  Electronically Signed   By: Thomas Ann M.D.   On: 10/14/2018 23:58  A/P Cardiorespiratory arrest Still requiring pressor support. Labs pending.  ARDS due to COVID-19 pneumonia Continue Supplemental oxygen and vent support.  Day 5 of 5 Remdesivir will be on 10/15/2018. Continue Dexamethasone Prognosis is poor and his siblings were made aware of this.  Family communication: Thomas Gill [facility]) [phone] Thomas Gill (Brother) [phone] Thomas Gill (Sister) [phone] Thomas Gill [facility]) [phone] (English fluent)  Over 120 minutes of critical care time were spent during the process of this emergent event.

## 2018-10-15 NOTE — Progress Notes (Signed)
Night shift [facility] ICU coverage note.  I spoke to the patient's sister Andree Moro in regards to the patient's condition and morning lab results. I told her that his urinary output has been very minimal after the code blue and that his creatinine has increased significantly as well, which has worsened his prognosis. His lactic acid is 3.8 mmol/L. This results are despite having a liter of NS bolus earlier.   Tennis Must, MD

## 2018-10-15 NOTE — Progress Notes (Signed)
Critical ABG results given to RN and Dr. Vaughan Browner. Verbal order received to increase pt to 7cc/kg tidal volume and to increase the RR from 30 to 34. RT to obtain repeat ABG after these settings changes. RT will continue to monitor. Obtain ABG in the AM per MD.

## 2018-10-15 NOTE — Progress Notes (Signed)
CRITICAL VALUE ALERT  Critical Value:  Lactic acid 3.4  Date & Time Notied:  10/15/2018  Provider Notified: Dr. Olevia Bowens  Orders Received/Actions taken: See note by Dr. Olevia Bowens

## 2018-10-15 NOTE — Progress Notes (Signed)
Dr. Vaughan Browner notified of BIS readings less than 20. Per MD, continue sedation at this time.

## 2018-10-15 NOTE — Procedures (Signed)
After consent was obtained from the patient's sister Rhone Ozaki), we prepared for the procedure. A time out was performed. My hands were washed immediately prior to the procedure. I wore a surgical cap, mask with protective eyewear, sterile gown and sterile gloves throughout the procedure. The  RIGHT inguinal region was prepped using chlorhexidine scrub and draped in sterile fashion using a full drape and sterile probe cover employed. The femoral pulse was identified and while the patient was under sedation, and after localizing the right arterial femoral pulse with the US probe and while palpating the right arterial femoral pulse, a right venous femoral triple lumen catheter was inserted. All lumens were patent. Blood loss was less than 50 mL. The catheter was secured with sutures and dressing. The patient tolerated the procedure well.   Tennis Must, MD

## 2018-10-15 NOTE — Progress Notes (Signed)
2253: Pt asystole-called code blue-initiated CPR Given epi x 3 and 2 amps bicarb 2302:Rosc Pt in a-fib RVR for a short time and converted to NSR.  0000: DR.Ortiz at bedside for placement of CVC.

## 2018-10-15 NOTE — Progress Notes (Signed)
Updated Pt's daughter via telephone and interpreter/family member. Questions answered regarding disease process and plan of care. Placed phone on speaker to facilitate communication directly to patient. Noted an increase in BIS during conversation. Family stated "Thank you very much." Will continue to involve family and update as possible.

## 2018-10-15 NOTE — Progress Notes (Signed)
Pt with low Spo2 of 78%. Dr. Vaughan Browner at bedside. Pt suctioned with small amount of tan/pink tinged secretions. Pt continues to breathe over vent and is pulling 600+tidal volumes. Per Dr. Vaughan Browner, pt changed back to pressure control at this time: PC 12/+16/18/100%. RT will continue to monitor and obtain ABG this afternoon. May try PRVC at later time per MD.

## 2018-10-15 NOTE — Progress Notes (Signed)
Pt changed to PRVC 450/30/100%/+16 per Dr. Vaughan Browner. Once pt is paralyzed Pt to be put on 6cc tidal volume and ABG to be drawn this afternoon Per Dr. Vaughan Browner verbal order.

## 2018-10-15 NOTE — Progress Notes (Signed)
PROGRESS NOTE  Thomas Gill QQI:[id number] DOB: [birth year] DOA: 10/22/2018 PCP: Patient, No Pcp Per   LOS: 4 days   Brief Narrative / Interim history: Thomas Gill is an 63 y.o. male diagnosed with COVID-19 last week test on 10/21/2018 was positive, presents to the ED with progressive shortness of breath, cough and diarrhea.  When EMS arrived at his house he was satting in the 60s on room air.  Patient was admitted to the ICU and intubated within 24 hours of arrival.  Significant events: 9/14 admitted 9/15 intubated 9/17 cardiopulmonary arrest, asystole, ROSC in 9 minutes per chart 9/17 femoral line  Subjective: -Events overnight noted, cardiopulmonary arrest for about 9 minutes.  He is sedated and on the vent this morning  Assessment & Plan: Active Problems:   COVID-19 virus infection   Acute respiratory failure with hypoxia (HCC)   AKI (acute kidney injury) ([place])  Principal Problem ARDS / Acute hypoxic respiratory failure due to Covid 19 pneumonia -Remains significantly hypoxic and vent dependent, with significant vent dyssynchrony, will paralyze over the next 24 hours.  Detailed sedation and vent management per PCCM  Vent Mode: PCV FiO2 (%):  [70 %-100 %] 70 % Set Rate:  [18 bmp] 18 bmp PEEP:  [16 cmH20-18 cmH20] 16 cmH20 Plateau Pressure:  [26 cmH20-28 cmH20] 28 cmH20 -Completed Remdesivir on 9/18 -Continue Decadron  COVID-19 Labs  Recent Labs    10/14/18 0435  DDIMER >20.00*  FERRITIN 172  LDH 670*  CRP 13.0*    Lab Results  Component Value Date   SARSCOV2NAA POSITIVE (A) 10/12/2018   Active Problems Cardiac arrest/asystole -On 9/17, ROSC in 9 minutes, epi x3, 2 A of bicarb, short run of A. fib with RVR then normal sinus rhythm -Had emergent femoral line placed, patient started on norepinephrine infusion, wean as tolerated -Not entirely clear cause for cardiac arrest,?  PE given significantly elevated d-dimer and enlarged RV and a bedside echo.  Unable to  obtain CT angiogram due to renal failure.  Start empiric heparin and obtain lower extremity [place]  Acute kidney injury -Postarrest, closely monitor urine output -Start IV fluids, closely monitor urine output and renal function/electrolytes  Elevated LFTs, shock liver -Shock liver following cardiac arrest. Trend LFTs  DM2 with hyperglycemia -continue insulin, hold home agents, CBGs as below  CBG (last 3)  Recent Labs    10/15/18 0056 10/15/18 0358 10/15/18 0804  GLUCAP 227* 317* 320*    Scheduled Meds: . artificial tears  1 application Both Eyes H4R  . chlorhexidine gluconate (MEDLINE KIT)  15 mL Mouth Rinse BID  . Chlorhexidine Gluconate Cloth  6 each Topical Daily  . dexamethasone (DECADRON) injection  6 mg Intravenous Q12H  . enoxaparin (LOVENOX) injection  45 mg Subcutaneous Q12H  . famotidine  20 mg Oral BID  . feeding supplement (PRO-STAT SUGAR FREE 64)  60 mL Per Tube BID  . feeding supplement (VITAL HIGH PROTEIN)  1,000 mL Per Tube Q24H  . insulin aspart  0-20 Units Subcutaneous Q4H  . insulin detemir  10 Units Subcutaneous BID  . mouth rinse  15 mL Mouth Rinse 10 times per day  . multivitamin with minerals  1 tablet Per Tube Daily  . sodium bicarbonate       Continuous Infusions: . sodium chloride Stopped (10/15/18 0343)  . fentaNYL infusion INTRAVENOUS 125 mcg/hr (10/15/18 0400)  . midazolam 3 mg/hr (10/15/18 0748)  . norepinephrine (LEVOPHED) Adult infusion 30 mcg/min (10/15/18 0555)  . remdesivir 100 mg in NS  250 mL 500 mL/hr at 10/14/18 1857   PRN Meds:.  DVT prophylaxis: Lovenox Code Status: Full code Family Communication: Called family in [place] early this morning, Dwana Curd speaks [place], [phone] Disposition Plan: remain in ICU  Consultants:   PCCM  Antimicrobials:  None    Objective: Vitals:   10/15/18 0530 10/15/18 0545 10/15/18 0600 10/15/18 0800  BP: 136/82 139/84 124/79 127/76  Pulse: 73 72 72 68  Resp: (!) 24 (!) 23  (!) 23 20  Temp: 99.1 F (37.3 C) 98.2 F (36.8 C) 99.1 F (37.3 C) 99.9 F (37.7 C)  TempSrc:      SpO2: 98% 97% 97% 95%  Weight:      Height:        Intake/Output Summary (Last 24 hours) at 10/15/2018 0827 Last data filed at 10/15/2018 0800 Gross per 24 hour  Intake 5908.46 ml  Output 1240 ml  Net 4668.46 ml   Filed Weights   10/03/2018 1207 10/13/18 0433 10/14/18 0500  Weight: 93 kg 92.4 kg 92.3 kg    Examination:  Constitutional: Sedated, on the vent ENMT: ETT in place Neck: normal, supple Respiratory: Coarse bilaterally, no wheezing, breathing with the vent Cardiovascular: Regular rate and rhythm, no murmurs appreciated.  No peripheral edema  Abdomen: Soft, ND, bowel sounds positive Musculoskeletal: no clubbing / cyanosis.  Skin: No rashes seen Neurologic: Sedated  Data Reviewed: I have independently reviewed following labs and imaging studies   CBC: Recent Labs  Lab 10/13/2018 1126  10/12/18 1323 10/13/18 0420  10/14/18 0435 10/14/18 1050 10/14/18 2325 10/15/18 0335 10/15/18 0440  WBC 8.6   < > 11.7* 11.2*  --  12.2*  --  19.7*  --  20.7*  NEUTROABS 8.3*  --   --   --   --   --   --  12.7*  --   --   HGB 14.9   < > 13.7 13.5   < > 14.4 14.6 12.8* 15.3 13.4  HCT 44.1   < > 41.7 41.7   < > 45.9 43.0 45.6 45.0 45.3  MCV 91.3   < > 92.5 94.3  --  96.6  --  109.4*  --  102.7*  PLT 389   < > 251 246  --  187  --  209  --  192   < > = values in this interval not displayed.   Basic Metabolic Panel: Recent Labs  Lab 10/12/18 1323 10/13/18 0420  10/14/18 0435 10/14/18 1050 10/14/18 2325 10/15/18 0335 10/15/18 0440  NA 133* 134*   < > 137 137 145 139 141  K 3.9 4.3   < > 4.6 5.2* 5.8* 5.2* 4.8  CL 93* 94*  --  97*  --  98  --  99  CO2 28 29  --  29  --  34*  --  26  GLUCOSE 289* 216*  --  150*  --  206*  --  334*  BUN 30* 28*  --  22*  --  34*  --  49*  CREATININE 0.81 0.85  --  0.85  --  2.26*  --  2.72*  CALCIUM 8.4* 8.5*  --  8.1*  --  8.3*  --  7.1*   MG  --   --   --   --   --  3.3*  --  2.5*  PHOS  --   --   --   --   --   --   --  8.7*   < > = values in this interval not displayed.   GFR: Estimated Creatinine Clearance: 29 mL/min (A) (by C-G formula based on SCr of 2.72 mg/dL (H)). Liver Function Tests: Recent Labs  Lab 10/02/2018 1126 10/12/18 1323 10/13/18 0420 10/14/18 0435 10/15/18 0440  AST 64* 65* 49* 60* 139*  ALT 65* 60* 57* 64* 101*  ALKPHOS 91 84 83 108 122  BILITOT 1.0 0.2* 0.4 0.6 0.8  PROT 6.9 6.5 6.3* 6.5 5.7*  ALBUMIN 2.3* 2.4* 2.4* 2.5* 2.1*   No results for input(s): LIPASE, AMYLASE in the last 168 hours. No results for input(s): AMMONIA in the last 168 hours. Coagulation Profile: No results for input(s): INR, PROTIME in the last 168 hours. Cardiac Enzymes: No results for input(s): CKTOTAL, CKMB, CKMBINDEX, TROPONINI in the last 168 hours. BNP (last 3 results) No results for input(s): PROBNP in the last 8760 hours. HbA1C: No results for input(s): HGBA1C in the last 72 hours. CBG: Recent Labs  Lab 10/14/18 1604 10/14/18 2025 10/15/18 0056 10/15/18 0358 10/15/18 0804  GLUCAP 114* 160* 227* 317* 320*   Lipid Profile: No results for input(s): CHOL, HDL, LDLCALC, TRIG, CHOLHDL, LDLDIRECT in the last 72 hours. Thyroid Function Tests: No results for input(s): TSH, T4TOTAL, FREET4, T3FREE, THYROIDAB in the last 72 hours. Anemia Panel: Recent Labs    10/14/18 0435  FERRITIN 172   Urine analysis:    Component Value Date/Time   COLORURINE yellow 12/22/2007 0826   APPEARANCEUR Clear 12/22/2007 0826   LABSPEC 1.025 12/22/2007 0826   PHURINE 6.0 12/22/2007 0826   HGBUR negative 12/22/2007 0826   BILIRUBINUR negative 12/12/2014 1241   KETONESUR negative 12/12/2014 1241   PROTEINUR negative 12/12/2014 1241   UROBILINOGEN 0.2 12/12/2014 1241   UROBILINOGEN 0.2 12/22/2007 0826   NITRITE Negative 12/12/2014 1241   NITRITE negative 12/22/2007 0826   LEUKOCYTESUR Negative 12/12/2014 1241   Sepsis  Labs: Invalid input(s): PROCALCITONIN, LACTICIDVEN  Recent Results (from the past 240 hour(s))  SARS Coronavirus 2 [facility] order, Performed in [facility] hospital lab) Nasopharyngeal Nasopharyngeal Swab     Status: Abnormal   Collection Time: 10/14/2018 11:30 AM   Specimen: Nasopharyngeal Swab  Result Value Ref Range Status   SARS Coronavirus 2 POSITIVE (A) NEGATIVE Final    Comment: RESULT CALLED TO, READ BACK BY AND VERIFIED WITH: RN H HARDY AT 1304 10/12/2018 BY L BENFIELD (NOTE) If result is NEGATIVE SARS-CoV-2 target nucleic acids are NOT DETECTED. The SARS-CoV-2 RNA is generally detectable in upper and lower  respiratory specimens during the acute phase of infection. The lowest  concentration of SARS-CoV-2 viral copies this assay can detect is 250  copies / mL. A negative result does not preclude SARS-CoV-2 infection  and should not be used as the sole basis for treatment or other  patient management decisions.  A negative result may occur with  improper specimen collection / handling, submission of specimen other  than nasopharyngeal swab, presence of viral mutation(s) within the  areas targeted by this assay, and inadequate number of viral copies  (<250 copies / mL). A negative result must be combined with clinical  observations, patient history, and epidemiological information. If result is POSITIVE SARS-CoV-2 target nucleic acids are DETEC TED. The SARS-CoV-2 RNA is generally detectable in upper and lower  respiratory specimens during the acute phase of infection.  Positive  results are indicative of active infection with SARS-CoV-2.  Clinical  correlation with patient history and other diagnostic information is  necessary  to determine patient infection status.  Positive results do  not rule out bacterial infection or co-infection with other viruses. If result is PRESUMPTIVE POSTIVE SARS-CoV-2 nucleic acids MAY BE PRESENT.   A presumptive positive result was obtained on  the submitted specimen  and confirmed on repeat testing.  While 2019 novel coronavirus  (SARS-CoV-2) nucleic acids may be present in the submitted sample  additional confirmatory testing may be necessary for epidemiological  and / or clinical management purposes  to differentiate between  SARS-CoV-2 and other Sarbecovirus currently known to infect humans.  If clinically indicated additional testing with an alternate test  methodology (LAB7 [id number]) is advised. The SARS-CoV-2 RNA is generally  detectable in upper and lower respiratory specimens during the acute  phase of infection. The expected result is Negative. Fact Sheet for Patients:  StrictlyIdeas.no Fact Sheet for Healthcare Providers: BankingDealers.co.za This test is not yet approved or cleared by the [place] FDA and has been authorized for detection and/or diagnosis of SARS-CoV-2 by FDA under an Emergency Use Authorization (EUA).  This EUA will remain in effect (meaning this test can be used) for the duration of the COVID-19 declaration under Section 564(b)(1) of the Act, 21 U.S.C. section 360bbb-3(b)(1), unless the authorization is terminated or revoked sooner. Performed at [facility], [place] [address], [place], [place] [address]   MRSA PCR Screening     Status: None   Collection Time: 10/12/18  9:05 PM   Specimen: Nasal Mucosa; Nasopharyngeal  Result Value Ref Range Status   MRSA by PCR NEGATIVE NEGATIVE Final    Comment:        The GeneXpert MRSA Assay (FDA approved for NASAL specimens only), is one component of a comprehensive MRSA colonization surveillance program. It is not intended to diagnose MRSA infection nor to guide or monitor treatment for MRSA infections. Performed at [facility], [place] [address], [place], [place] [address]      Radiology Studies: Dg Chest Port 1 View  Result Date: 10/14/2018 CLINICAL DATA:  63 year old male  status post cardiac arrest. COVID-19. EXAM: PORTABLE CHEST 1 VIEW COMPARISON:  0059 hours earlier today. FINDINGS: Portable AP semi upright view at 2328 hours. Endotracheal tube tip is visible at the level the clavicles. Enteric tube courses to the abdomen with side hole at the level of the gastric body. New pacer or resuscitation pads projecting over the central chest. Some of the right lung apex was not included. Mediastinal contours remain normal. Larger lung volumes and improved bilateral ventilation with decreased patchy and indistinct bilateral pulmonary opacity. No pneumothorax or pleural effusion identified. No acute rib fracture identified. Negative visible bowel gas pattern. IMPRESSION: 1. Satisfactory ET tube and enteric tube placement. 2. Larger lung volumes with improved ventilation from earlier today. No new cardiopulmonary abnormality. Electronically Signed   By: Genevie Ann M.D.   On: 10/14/2018 23:58   Dg Chest Port 1 View  Result Date: 10/14/2018 CLINICAL DATA:  63 year old male intubated. COVID-19. EXAM: PORTABLE CHEST 1 VIEW COMPARISON:  10/08/2018 portable chest. FINDINGS: Portable AP semi upright view at 0059 hours. Endotracheal tube tip in good position just below the clavicles. Enteric tube courses to the abdomen and the tip is at the level of the gastric body. Larger lung volumes. Normal cardiac size and mediastinal contours. Patchy and indistinct bilateral mid and lower lung opacity appears greater on the left and mildly increased. No pneumothorax or pleural effusion. Negative visible bowel gas pattern. No acute osseous abnormality identified.  IMPRESSION: 1. Endotracheal tube and enteric tube in good position. 2. Larger lung volumes. Bilateral COVID-19 pneumonia stable to mildly progressed since 10/13/2018. Electronically Signed   By: Genevie Ann M.D.   On: 10/14/2018 01:30    The patient is critically ill with multiple organ system failure and requires high complexity decision making for  assessment and support, frequent evaluation and titration of therapies, advanced monitoring, review of radiographic studies and interpretation of complex data.    Critical Care Time devoted to patient care services, exclusive of separately billable procedures, described in this note is 35 minutes.   Marzetta Board, MD, PhD [facility]  Contact via  [email or web address]  [place] P: [phone] F: [phone]

## 2018-10-15 NOTE — Progress Notes (Signed)
Critical ABG results given to Dr. Vaughan Browner. Verbal order received to decrease FiO2 and leave all other settings at this time. RT to obtain ABG in AM. RN notified of changes. Will continue to monitor.

## 2018-10-15 NOTE — Progress Notes (Signed)
NAMEBohdan Gill, MRN:  [id number], DOB:  Dec 05, 1955, LOS: 4 ADMISSION DATE:  10-20-2018, CONSULTATION DATE:  9/15 REFERRING MD:  Delane Ginger, CHIEF COMPLAINT:  dyspnea   Brief History   63 y/o male with obesity, DM2 admitted with COVID Pneumonia.   Past Medical History  Former smoker DM2  [facility] Events   9/14 admission 9/17 intubated, prolonged.  Cardiac arrest later in day while he was in prone position.  Central line placed.  Started on Levophed drip  Consults:  PCCM  Procedures:  ETT 9/17>>> Femoral CVL 9/17 >>  Significant Diagnostic Tests:    Micro Data:  9/14 SARS COV 2 positive  Antimicrobials/COVID Rx  9/14 Decadron >  9/14 Remdesivir >    Interim history/subjective:  Cardiac arrest overnight requiring 3 amps of epi On Levophed drip now Poor urine output with lactic acidosis.  Objective   Blood pressure 127/76, pulse 68, temperature 99.9 F (37.7 C), resp. rate 20, height 5\' 3"  (1.6 m), weight 92.3 kg, SpO2 95 %.    Vent Mode: PCV FiO2 (%):  [70 %-100 %] 70 % Set Rate:  [16 bmp-18 bmp] 18 bmp Vt Set:  [450 mL] 450 mL PEEP:  [15 cmH20-18 cmH20] 16 cmH20 Plateau Pressure:  [26 cmH20-28 cmH20] 28 cmH20   Intake/Output Summary (Last 24 hours) at 10/15/2018 0810 Last data filed at 10/15/2018 0800 Gross per 24 hour  Intake 5908.46 ml  Output 1240 ml  Net 4668.46 ml   Filed Weights   2018/10/20 1207 10/13/18 0433 10/14/18 0500  Weight: 93 kg 92.4 kg 92.3 kg   Examination: Gen:      No acute distress HEENT:  EOMI, sclera anicteric, ETT Neck:     No masses; no thyromegaly Lungs:    Clear to auscultation bilaterally; normal respiratory effort CV:         Regular rate and rhythm; no murmurs Abd:      + bowel sounds; soft, non-tender; no palpable masses, no distension Ext:    No edema; adequate peripheral perfusion Skin:      Warm and dry; no rash Neuro: Sedated, unresponsive  Resolved Hospital Problem list     Assessment & Plan:  ARDS  due to COVID 19 pneumonia Continue decadron Maintain on full vent support Stop proning due to cardiac arrest and hemodynamic instability Start Nimbex for 24 hours due to vent dyssynchrony Switch from pressure control to 6 cc/kg volume control Target driving pressure of less than 15.  Cardiac arrest, lactic acidosis Continue Levophed Bedside echo shows slight dilatation in the RV.  Given elevated d-dimer we will treat empirically for PE Start heparin drip. Telemetry monitoring  Sedation: Fentanyl and versed drips to continue  Acute kidney injury Supportive care.  Monitor urine output and creatinine.  Transaminitis, shock liver Trend LFT  Best practice:  Diet: regular diet Pain/Anxiety/Delirium protocol (if indicated): n/a VAP protocol (if indicated): n/a DVT prophylaxis: Heparin drip GI prophylaxis: SSI, lantus Glucose control: per TRH Mobility: Bedrest Code Status: full Family Communication:  Disposition: remain in ICU  Labs   CBC: Recent Labs  Lab 10/20/18 1126  10/12/18 1323 10/13/18 0420  10/14/18 0435 10/14/18 1050 10/14/18 2325 10/15/18 0335 10/15/18 0440  WBC 8.6   < > 11.7* 11.2*  --  12.2*  --  19.7*  --  20.7*  NEUTROABS 8.3*  --   --   --   --   --   --  12.7*  --   --  HGB 14.9   < > 13.7 13.5   < > 14.4 14.6 12.8* 15.3 13.4  HCT 44.1   < > 41.7 41.7   < > 45.9 43.0 45.6 45.0 45.3  MCV 91.3   < > 92.5 94.3  --  96.6  --  109.4*  --  102.7*  PLT 389   < > 251 246  --  187  --  209  --  192   < > = values in this interval not displayed.    Basic Metabolic Panel: Recent Labs  Lab 10/12/18 1323 10/13/18 0420  10/14/18 0435 10/14/18 1050 10/14/18 2325 10/15/18 0335 10/15/18 0440  NA 133* 134*   < > 137 137 145 139 141  K 3.9 4.3   < > 4.6 5.2* 5.8* 5.2* 4.8  CL 93* 94*  --  97*  --  98  --  99  CO2 28 29  --  29  --  34*  --  26  GLUCOSE 289* 216*  --  150*  --  206*  --  334*  BUN 30* 28*  --  22*  --  34*  --  49*  CREATININE 0.81 0.85   --  0.85  --  2.26*  --  2.72*  CALCIUM 8.4* 8.5*  --  8.1*  --  8.3*  --  7.1*  MG  --   --   --   --   --  3.3*  --  2.5*  PHOS  --   --   --   --   --   --   --  8.7*   < > = values in this interval not displayed.   GFR: Estimated Creatinine Clearance: 29 mL/min (A) (by C-G formula based on SCr of 2.72 mg/dL (H)). Recent Labs  Lab 10/23/2018 1126 10/24/2018 1135  09/30/2018 1420  10/13/18 0420 10/14/18 0435 10/14/18 2325 10/15/18 0440  PROCALCITON 0.19  --   --   --   --   --   --   --   --   WBC 8.6  --    < >  --    < > 11.2* 12.2* 19.7* 20.7*  LATICACIDVEN  --  2.8*  --  2.1*  --   --   --   --  3.4*   < > = values in this interval not displayed.    Liver Function Tests: Recent Labs  Lab 10/22/2018 1126 10/12/18 1323 10/13/18 0420 10/14/18 0435 10/15/18 0440  AST 64* 65* 49* 60* 139*  ALT 65* 60* 57* 64* 101*  ALKPHOS 91 84 83 108 122  BILITOT 1.0 0.2* 0.4 0.6 0.8  PROT 6.9 6.5 6.3* 6.5 5.7*  ALBUMIN 2.3* 2.4* 2.4* 2.5* 2.1*   No results for input(s): LIPASE, AMYLASE in the last 168 hours. No results for input(s): AMMONIA in the last 168 hours.  ABG    Component Value Date/Time   PHART 7.204 (L) 10/15/2018 0335   PCO2ART 76.4 (HH) 10/15/2018 0335   PO2ART 171.0 (H) 10/15/2018 0335   HCO3 30.0 (H) 10/15/2018 0335   TCO2 32 10/15/2018 0335   O2SAT 99.0 10/15/2018 0335     Coagulation Profile: No results for input(s): INR, PROTIME in the last 168 hours.  Cardiac Enzymes: No results for input(s): CKTOTAL, CKMB, CKMBINDEX, TROPONINI in the last 168 hours.  HbA1C: Hgb A1c MFr Bld  Date/Time Value Ref Range Status  10/12/2018 02:30 PM 9.1 (H) 4.8 -  5.6 % Final    Comment:    (NOTE)         Prediabetes: 5.7 - 6.4         Diabetes: >6.4         Glycemic control for adults with diabetes: <7.0     CBG: Recent Labs  Lab 10/14/18 1215 10/14/18 1604 10/14/18 2025 10/15/18 0056 10/15/18 0358  GLUCAP 125* 114* 160* 227* 317*   The patient is critically  ill with multiple organ system failure and requires high complexity decision making for assessment and support, frequent evaluation and titration of therapies, advanced monitoring, review of radiographic studies and interpretation of complex data.   Critical Care Time devoted to patient care services, exclusive of separately billable procedures, described in this note is 35 minutes.   Chilton Greathouse MD [facility] Pager [phone] If no answer call [phone] 10/15/2018, 8:15 AM

## 2018-10-15 NOTE — Progress Notes (Signed)
Wasted 65mL of midazolam with Denyse Amass, RN.

## 2018-10-15 NOTE — Progress Notes (Signed)
Venous duplex lower ext  has been completed. Refer to [facility] under chart review to view preliminary results.   10/15/2018  4:11 PM Tevita Gomer, Bonnye Fava

## 2018-10-15 NOTE — Progress Notes (Signed)
RT NOTE:  RT called to bedside during Code. RT assisted with turning patient to supine during pulse check. Pt remained on vent during code. ETT remained stable @ 23cm/lip.

## 2018-10-16 ENCOUNTER — Encounter (HOSPITAL_COMMUNITY): Payer: Self-pay | Admitting: Nephrology

## 2018-10-16 DIAGNOSIS — Z01818 Encounter for other preprocedural examination: Secondary | ICD-10-CM

## 2018-10-16 DIAGNOSIS — U071 COVID-19: Secondary | ICD-10-CM | POA: Diagnosis not present

## 2018-10-16 DIAGNOSIS — J9601 Acute respiratory failure with hypoxia: Secondary | ICD-10-CM | POA: Diagnosis not present

## 2018-10-16 DIAGNOSIS — I469 Cardiac arrest, cause unspecified: Secondary | ICD-10-CM | POA: Diagnosis not present

## 2018-10-16 DIAGNOSIS — N179 Acute kidney failure, unspecified: Secondary | ICD-10-CM | POA: Diagnosis not present

## 2018-10-16 LAB — COMPREHENSIVE METABOLIC PANEL
ALT: 69 U/L — ABNORMAL HIGH (ref 0–44)
AST: 56 U/L — ABNORMAL HIGH (ref 15–41)
Albumin: 1.8 g/dL — ABNORMAL LOW (ref 3.5–5.0)
Alkaline Phosphatase: 93 U/L (ref 38–126)
Anion gap: 12 (ref 5–15)
BUN: 86 mg/dL — ABNORMAL HIGH (ref 6–20)
CO2: 29 mmol/L (ref 22–32)
Calcium: 7.2 mg/dL — ABNORMAL LOW (ref 8.9–10.3)
Chloride: 104 mmol/L (ref 98–111)
Creatinine, Ser: 3.8 mg/dL — ABNORMAL HIGH (ref 0.61–1.24)
GFR calc Af Amer: 19 mL/min — ABNORMAL LOW (ref >60)
GFR calc non Af Amer: 16 mL/min — ABNORMAL LOW (ref >60)
Glucose, Bld: 236 mg/dL — ABNORMAL HIGH (ref 70–99)
Potassium: 5.3 mmol/L — ABNORMAL HIGH (ref 3.5–5.1)
Sodium: 145 mmol/L (ref 135–145)
Total Bilirubin: 0.3 mg/dL (ref 0.3–1.2)
Total Protein: 5.2 g/dL — ABNORMAL LOW (ref 6.5–8.1)

## 2018-10-16 LAB — PHOSPHORUS
Phosphorus: 5.2 mg/dL — ABNORMAL HIGH (ref 2.5–4.6)
Phosphorus: 5.2 mg/dL — ABNORMAL HIGH (ref 2.5–4.6)

## 2018-10-16 LAB — POCT I-STAT 7, (LYTES, BLD GAS, ICA,H+H)
Acid-Base Excess: 1 mmol/L (ref 0.0–2.0)
Bicarbonate: 28.9 mmol/L — ABNORMAL HIGH (ref 20.0–28.0)
Calcium, Ion: 1.08 mmol/L — ABNORMAL LOW (ref 1.15–1.40)
HCT: 36 % — ABNORMAL LOW (ref 39.0–52.0)
Hemoglobin: 12.2 g/dL — ABNORMAL LOW (ref 13.0–17.0)
O2 Saturation: 92 %
Patient temperature: 37.1
Potassium: 4.9 mmol/L (ref 3.5–5.1)
Sodium: 141 mmol/L (ref 135–145)
TCO2: 31 mmol/L (ref 22–32)
pCO2 arterial: 60.6 mmHg — ABNORMAL HIGH (ref 32.0–48.0)
pH, Arterial: 7.286 — ABNORMAL LOW (ref 7.350–7.450)
pO2, Arterial: 75 mmHg — ABNORMAL LOW (ref 83.0–108.0)

## 2018-10-16 LAB — GLUCOSE, CAPILLARY
Glucose-Capillary: 221 mg/dL — ABNORMAL HIGH (ref 70–99)
Glucose-Capillary: 193 mg/dL — ABNORMAL HIGH (ref 70–99)
Glucose-Capillary: 227 mg/dL — ABNORMAL HIGH (ref 70–99)
Glucose-Capillary: 232 mg/dL — ABNORMAL HIGH (ref 70–99)
Glucose-Capillary: 223 mg/dL — ABNORMAL HIGH (ref 70–99)

## 2018-10-16 LAB — CBC
HCT: 38.3 % — ABNORMAL LOW (ref 39.0–52.0)
Hemoglobin: 11.7 g/dL — ABNORMAL LOW (ref 13.0–17.0)
MCH: 30.7 pg (ref 26.0–34.0)
MCHC: 30.5 g/dL (ref 30.0–36.0)
MCV: 100.5 fL — ABNORMAL HIGH (ref 80.0–100.0)
Platelets: 193 10*3/uL (ref 150–400)
RBC: 3.81 MIL/uL — ABNORMAL LOW (ref 4.22–5.81)
RDW: 14.4 % (ref 11.5–15.5)
WBC: 11.1 10*3/uL — ABNORMAL HIGH (ref 4.0–10.5)
nRBC: 0.4 % — ABNORMAL HIGH (ref 0.0–0.2)

## 2018-10-16 LAB — HEPARIN LEVEL (UNFRACTIONATED)
Heparin Unfractionated: 0.62 IU/mL (ref 0.30–0.70)
Heparin Unfractionated: 0.71 IU/mL — ABNORMAL HIGH (ref 0.30–0.70)

## 2018-10-16 LAB — C-REACTIVE PROTEIN: CRP: 26.1 mg/dL — ABNORMAL HIGH (ref <1.0)

## 2018-10-16 LAB — MAGNESIUM
Magnesium: 2.5 mg/dL — ABNORMAL HIGH (ref 1.7–2.4)
Magnesium: 2.5 mg/dL — ABNORMAL HIGH (ref 1.7–2.4)

## 2018-10-16 LAB — D-DIMER, QUANTITATIVE: D-Dimer, Quant: 18.02 ug/mL-FEU — ABNORMAL HIGH (ref 0.00–0.50)

## 2018-10-16 MED ORDER — POLYETHYLENE GLYCOL 3350 17 G PO PACK
17.0000 g | PACK | Freq: Two times a day (BID) | ORAL | Status: DC
  Administered 2018-10-16 – 2018-10-25 (×11): 17 g
  Filled 2018-10-16 (×12): qty 1

## 2018-10-16 MED ORDER — SENNOSIDES 8.8 MG/5ML PO SYRP
5.0000 mL | ORAL_SOLUTION | Freq: Two times a day (BID) | ORAL | Status: DC
  Administered 2018-10-16 – 2018-10-25 (×11): 5 mL
  Filled 2018-10-16 (×13): qty 5

## 2018-10-16 NOTE — Progress Notes (Signed)
Daughter called from [place] at 5 and charge nurse updated family.Thomas Gill

## 2018-10-16 NOTE — Progress Notes (Signed)
Using [place] interpreters at [phone], interpreter [phone] I was able to get in touch with the patient's sister Andree Moro [phone]) who currently lives in [place], and was able to give updates and answer all their questions  Costin M. Cruzita Lederer, MD, PhD [facility]  Contact via  [email or web address]  [place] P: [phone] F: [phone]

## 2018-10-16 NOTE — Progress Notes (Signed)
ANTICOAGULATION CONSULT NOTE - Follow Up Consult  Pharmacy Consult for Heparin Indication: pulmonary embolus  No Known Allergies  Patient Measurements: Height: 5\' 3"  (160 cm) Weight: 218 lb 7.6 oz (99.1 kg) IBW/kg (Calculated) : 56.9 Heparin Dosing Weight: 77 kg  Vital Signs: Temp: 98.8 F (37.1 C) (09/19 0615) BP: 115/79 (09/19 0615) Pulse Rate: 72 (09/19 0615)  Labs: Recent Labs    10/14/18 0435  10/14/18 2325  10/15/18 0440  10/15/18 1754 10/15/18 1930 10/16/18 0445 10/16/18 0515  HGB 14.4   < > 12.8*   < > 13.4   < > 13.6  --  11.7* 12.2*  HCT 45.9   < > 45.6   < > 45.3   < > 40.0  --  38.3* 36.0*  PLT 187  --  209  --  192  --   --   --  193  --   HEPARINUNFRC  --   --   --   --   --   --   --  1.04* 0.62  --   CREATININE 0.85  --  2.26*  --  2.72*  --   --   --   --   --   TROPONINIHS  --   --  247*  --  308*  --   --   --   --   --    < > = values in this interval not displayed.    Estimated Creatinine Clearance: 30.1 mL/min (A) (by C-G formula based on SCr of 2.72 mg/dL (H)).   Assessment: Thomas Gill is a 63 y.o. male presenting on 9/14 with worsening O2 saturation, cough, and diarrhea. Patient was diagnosed with covid last week. On 9/17, patient decompensated requiring intubation. Last night patient had a cardiorespiratory arrest/code blue event that lasted approximately 9 minutes until achieving ROSC.  Patient was not any PTA anticoagulation. When admitted, he was started on anticoagulation ppx  (Enoxparin SQ 0.5 mg/kg q12h). Given recent events, possible PE for cause of rapid decompensation. Starting empiric heparin while further diagnostics are completed.  PM 10/15/18  HL  0.62, therapeutic  Hgb, plt stable   RN reports some oral bleeding   LE doppler positive for VTE  Goal of Therapy:  Heparin level 0.3-0.7 units/ml Monitor platelets by anticoagulation protocol: Yes   Plan:   Continue  heparin infusion at 1100 units/hr   Recheck HL in 8  hours  Daily CBC/HL  Monitor for s/s of bleeding     Royetta Asal, PharmD, BCPS 10/16/2018 6:35 AM

## 2018-10-16 NOTE — Progress Notes (Signed)
Deferred family communication to MD today as patient may have to start CRRT at some point and family will need questions answered by MD. Ellamae Sia

## 2018-10-16 NOTE — Progress Notes (Signed)
[facility] called regarding multiple things. Patient TOF 0/4. Second location tested. TOF remained 0/4. Per order, NMB infusion stopped and TOF will be tested q15 minutes until there is a TOF present. Once a TOF is obtained will resume infusion at 3/4 previous rate.  Bloody oral secretions present along with bloody secretions from the ETT. Bright red blood along with blood clots present. Per [place], MD heparin infusion to be stopped at this time. Will continue to monitor patient.

## 2018-10-16 NOTE — Progress Notes (Signed)
NAME:  Thomas Gill, MRN:  [id number], DOB:  20-Mar-1955, LOS: 5 ADMISSION DATE:  November 02, 2018, CONSULTATION DATE:  9/15 REFERRING MD:  [place] Constable, CHIEF COMPLAINT:  dyspnea   Brief History   63 y/o male with obesity, DM2 admitted with COVID Pneumonia.   Past Medical History  Former smoker DM2  Significant Hospital Events   9/14 admission 9/17 intubated, prolonged.  Cardiac arrest later in day while he was in prone position.  Central line placed.  Started on Levophed drip 9/18- Paralyzed for vent dysynchrony 9/19- Started heparin for DVT  Consults:  PCCM  Procedures:  ETT 9/17>>> Femoral CVL 9/17 >>  Significant Diagnostic Tests:  Lower extremity duplex 9/19> age indeterminate bilateral DVT  Micro Data:  9/14 SARS COV 2 positive  Antimicrobials/COVID Rx  9/14 Decadron >  9/14 Remdesivir >    Interim history/subjective:  Remains on the ventilator, paralyzed Of Levophed Starting heparin drip for findings of DVT.  Objective   Blood pressure 115/79, pulse 72, temperature 98.8 F (37.1 C), resp. rate (!) 34, height 5\' 3"  (1.6 m), weight 99.1 kg, SpO2 92 %.    Vent Mode: PRVC FiO2 (%):  [70 %-100 %] 80 % Set Rate:  [18 bmp-34 bmp] 34 bmp Vt Set:  [340 mL-450 mL] 390 mL PEEP:  [14 cmH20-16 cmH20] 14 cmH20 Plateau Pressure:  [22 cmH20-27 cmH20] 22 cmH20   Intake/Output Summary (Last 24 hours) at 10/16/2018 0746 Last data filed at 10/16/2018 0600 Gross per 24 hour  Intake 5085.59 ml  Output 850 ml  Net 4235.59 ml   Filed Weights   10/13/18 0433 10/14/18 0500 10/16/18 0500  Weight: 92.4 kg 92.3 kg 99.1 kg   Examination: Gen:      No acute distress HEENT:  EOMI, sclera anicteric, ETT Neck:     No masses; no thyromegaly Lungs:    Clear to auscultation bilaterally; normal respiratory effort CV:         Regular rate and rhythm; no murmurs Abd:      + bowel sounds; soft, non-tender; no palpable masses, no distension Ext:    No edema; adequate peripheral perfusion Skin:       Warm and dry; no rash Neuro: Sedated, unresponsive.  Resolved Hospital Problem list     Assessment & Plan:  ARDS due to COVID 19 pneumonia Continue decadron Maintain on full vent support Discontinued proning due to cardiac arrest and hemodynamic instability Continue paralysis for vent dyssynchrony PRVC ventilation, 7 cc/kg Target driving pressure of less than 15.  Cardiac arrest, lactic acidosis Continue Levophed Telemetry monitoring  Lower extremity DVT Heparin drip  Sedation: Fentanyl and versed drips to continue  Acute kidney injury Supportive care.  Monitor urine output and creatinine. Will be headed for CRRT.  Will consult nephrology.  Transaminitis, shock liver Trend LFT  Best practice:  Diet: regular diet Pain/Anxiety/Delirium protocol (if indicated): n/a VAP protocol (if indicated): n/a DVT prophylaxis: Heparin drip GI prophylaxis: SSI, lantus Glucose control: per TRH Mobility: Bedrest Code Status: full Family Communication:  Disposition: remain in ICU  Labs   CBC: Recent Labs  Lab 2018-11-02 1126  10/13/18 0420  10/14/18 0435  10/14/18 2325  10/15/18 0440 10/15/18 1550 10/15/18 1754 10/16/18 0445 10/16/18 0515  WBC 8.6   < > 11.2*  --  12.2*  --  19.7*  --  20.7*  --   --  11.1*  --   NEUTROABS 8.3*  --   --   --   --   --  12.7*  --   --   --   --   --   --   HGB 14.9   < > 13.5   < > 14.4   < > 12.8*   < > 13.4 14.3 13.6 11.7* 12.2*  HCT 44.1   < > 41.7   < > 45.9   < > 45.6   < > 45.3 42.0 40.0 38.3* 36.0*  MCV 91.3   < > 94.3  --  96.6  --  109.4*  --  102.7*  --   --  100.5*  --   PLT 389   < > 246  --  187  --  209  --  192  --   --  193  --    < > = values in this interval not displayed.    Basic Metabolic Panel: Recent Labs  Lab 10/13/18 0420  10/14/18 0435  10/14/18 2325  10/15/18 0440 10/15/18 1550 10/15/18 1754 10/15/18 1930 10/16/18 0445 10/16/18 0515  NA 134*   < > 137   < > 145   < > 141 142 139  --  145 141  K 4.3    < > 4.6   < > 5.8*   < > 4.8 5.2* 5.1  --  5.3* 4.9  CL 94*  --  97*  --  98  --  99  --   --   --  104  --   CO2 29  --  29  --  34*  --  26  --   --   --  29  --   GLUCOSE 216*  --  150*  --  206*  --  334*  --   --   --  236*  --   BUN 28*  --  22*  --  34*  --  49*  --   --   --  86*  --   CREATININE 0.85  --  0.85  --  2.26*  --  2.72*  --   --   --  3.80*  --   CALCIUM 8.5*  --  8.1*  --  8.3*  --  7.1*  --   --   --  7.2*  --   MG  --   --   --   --  3.3*  --  2.5*  --   --  2.5* 2.5*  --   PHOS  --   --   --   --   --   --  8.7*  --   --  6.6* 5.2*  --    < > = values in this interval not displayed.   GFR: Estimated Creatinine Clearance: 21.6 mL/min (A) (by C-G formula based on SCr of 3.8 mg/dL (H)). Recent Labs  Lab 10/24/2018 1126 10/18/2018 1135  10/18/2018 1420  10/14/18 0435 10/14/18 2325 10/15/18 0440 10/16/18 0445  PROCALCITON 0.19  --   --   --   --   --   --   --   --   WBC 8.6  --    < >  --    < > 12.2* 19.7* 20.7* 11.1*  LATICACIDVEN  --  2.8*  --  2.1*  --   --   --  3.4*  --    < > = values in this interval not displayed.    Liver Function Tests: Recent Labs  Lab 10/12/18 1323  10/13/18 0420 10/14/18 0435 10/15/18 0440 10/16/18 0445  AST 65* 49* 60* 139* 56*  ALT 60* 57* 64* 101* 69*  ALKPHOS 84 83 108 122 93  BILITOT 0.2* 0.4 0.6 0.8 0.3  PROT 6.5 6.3* 6.5 5.7* 5.2*  ALBUMIN 2.4* 2.4* 2.5* 2.1* 1.8*   No results for input(s): LIPASE, AMYLASE in the last 168 hours. No results for input(s): AMMONIA in the last 168 hours.  ABG    Component Value Date/Time   PHART 7.286 (L) 10/16/2018 0515   PCO2ART 60.6 (H) 10/16/2018 0515   PO2ART 75.0 (L) 10/16/2018 0515   HCO3 28.9 (H) 10/16/2018 0515   TCO2 31 10/16/2018 0515   ACIDBASEDEF 1.0 10/15/2018 1550   O2SAT 92.0 10/16/2018 0515     Coagulation Profile: No results for input(s): INR, PROTIME in the last 168 hours.  Cardiac Enzymes: No results for input(s): CKTOTAL, CKMB, CKMBINDEX, TROPONINI in  the last 168 hours.  HbA1C: Hgb A1c MFr Bld  Date/Time Value Ref Range Status  04/23/18 02:30 PM 9.1 (H) 4.8 - 5.6 % Final    Comment:    (NOTE)         Prediabetes: 5.7 - 6.4         Diabetes: >6.4         Glycemic control for adults with diabetes: <7.0     CBG: Recent Labs  Lab 10/15/18 0804 10/15/18 1232 10/15/18 1649 10/15/18 1948 10/16/18 0409  GLUCAP 320* 329* 271* 229* 221*   The patient is critically ill with multiple organ system failure and requires high complexity decision making for assessment and support, frequent evaluation and titration of therapies, advanced monitoring, review of radiographic studies and interpretation of complex data.   Critical Care Time devoted to patient care services, exclusive of separately billable procedures, described in this note is 35 minutes.   Chilton GreathousePraveen Trenyce Loera MD [facility] 10/16/2018, 9:01 AM

## 2018-10-16 NOTE — Progress Notes (Addendum)
PROGRESS NOTE  Thomas Gill MIW:[id number] DOB: 05-03-1955 DOA: 10/18/2018 PCP: Patient, No Pcp Per   LOS: 5 days   Brief Narrative / Interim history: Thomas Gill is an 63 y.o. male diagnosed with COVID-19 last week test on 10/10/2018 was positive, presents to the ED with progressive shortness of breath, cough and diarrhea.  When EMS arrived at his house he was satting in the 60s on room air.  Patient was admitted to the ICU and intubated within 24 hours of arrival.  Significant events: 9/14 admitted 9/15 intubated 9/17 cardiopulmonary arrest, asystole, ROSC in 9 minutes per chart 9/17 femoral line 9/18 DVT found, started on heparin  Subjective/interval events: -No significant overnight events, he is intubated and sedated this morning, has been paralyzed over the last 24 hours  Assessment & Plan: Active Problems:   COVID-19 virus infection   Acute respiratory failure with hypoxia (HCC)   AKI (acute kidney injury) ([place])  Principal Problem ARDS / Acute hypoxic respiratory failure due to Covid 19 pneumonia -Remains significantly hypoxic and vent dependent, with significant vent dyssynchrony, paralyzed now over the past 24 hours and doing well on PRVC.  Sedation/vent management guided by PCCM  Vent Mode: PRVC FiO2 (%):  [70 %-100 %] 70 % Set Rate:  [18 bmp-34 bmp] 34 bmp Vt Set:  [340 mL-390 mL] 390 mL PEEP:  [12 cmH20-16 cmH20] 12 cmH20 Plateau Pressure:  [22 cmH20-27 cmH20] 23 cmH20 -Completed Remdesivir on 9/18 -Continue Decadron  COVID-19 Labs  Recent Labs    10/14/18 0435 10/16/18 0445  DDIMER >20.00* 18.02*  FERRITIN 172  --   LDH 670*  --   CRP 13.0* 26.1*    Lab Results  Component Value Date   SARSCOV2NAA POSITIVE (A) 10/03/2018   Active Problems Cardiac arrest/asystole -On 9/17, ROSC in 9 minutes, epi x3, 2 A of bicarb, short run of A. fib with RVR then normal sinus rhythm -Had emergent femoral line placed, patient started on norepinephrine infusion, wean  as tolerated -Cardiac arrest possibly caused by PE is lower extremity Dopplers showed DVTs bilaterally  Acute DVT -Lower extremity ultrasound obtained on 10/15/2018 showed age-indeterminate DVTs bilaterally.  Have to assume these are acute possibly causing a PE which led to his cardiac arrest.  Unable to obtain a CT angiogram due to renal failure -Continue heparin  Acute kidney injury -This is following cardiac arrest.  Creatinine continues to get worse today, he does have some urine output of only 850 cc over the last 4 hours but he is net +9 L -Nephrology consult today  Elevated LFTs, shock liver -Shock liver following cardiac arrest. Trend LFTs, fortunately improving this morning  DM2 with hyperglycemia -continue insulin, hold home agents, CBGs as below  CBG (last 3)  Recent Labs    10/15/18 1948 10/16/18 0409 10/16/18 0801  GLUCAP 229* 221* 193*    Scheduled Meds:  artificial tears  1 application Both Eyes G5O   chlorhexidine gluconate (MEDLINE KIT)  15 mL Mouth Rinse BID   Chlorhexidine Gluconate Cloth  6 each Topical Daily   dexamethasone (DECADRON) injection  6 mg Intravenous Q12H   famotidine  20 mg Oral Daily   feeding supplement (PRO-STAT SUGAR FREE 64)  60 mL Per Tube BID   feeding supplement (VITAL HIGH PROTEIN)  1,000 mL Per Tube Q24H   fentaNYL (SUBLIMAZE) injection  50 mcg Intravenous Once   insulin aspart  0-20 Units Subcutaneous Q4H   insulin detemir  10 Units Subcutaneous BID   mouth rinse  15 mL Mouth Rinse 10 times per day   multivitamin with minerals  1 tablet Per Tube Daily   Continuous Infusions:  sodium chloride Stopped (10/15/18 0343)   fentaNYL infusion INTRAVENOUS 100 mcg/hr (10/16/18 0100)   heparin 1,100 Units/hr (10/16/18 0100)   lactated ringers 75 mL/hr at 10/16/18 0210   midazolam 2 mg/hr (10/16/18 0100)   norepinephrine (LEVOPHED) Adult infusion 2 mcg/min (10/16/18 0100)   vecuronium (NORCURON) infusion 1 mcg/kg/min  (10/16/18 0100)   PRN Meds:.  DVT prophylaxis: Lovenox Code Status: Full code Family Communication: We will discuss with family in [place] this afternoon, Dwana Curd speaks [place], [phone] Disposition Plan: remain in ICU  Consultants:   PCCM  Antimicrobials:  None    Objective: Vitals:   10/16/18 0545 10/16/18 0600 10/16/18 0615 10/16/18 0811  BP: 114/78 111/75 115/79   Pulse: 68 66 72 69  Resp: (!) 32 (!) 34 (!) 34 (!) 34  Temp: 98.8 F (37.1 C) 98.8 F (37.1 C) 98.8 F (37.1 C)   TempSrc:      SpO2: 93% 93% 92% 93%  Weight:      Height:        Intake/Output Summary (Last 24 hours) at 10/16/2018 0946 Last data filed at 10/16/2018 0600 Gross per 24 hour  Intake 4376.51 ml  Output 720 ml  Net 3656.51 ml   Filed Weights   10/13/18 0433 10/14/18 0500 10/16/18 0500  Weight: 92.4 kg 92.3 kg 99.1 kg    Examination:  Constitutional: Sedated on the vent ENMT: ETT in place Neck: normal, supple Respiratory: Coarse breath sounds bilaterally, no wheezing, breathing with the vent Cardiovascular: Regular rate and rhythm, no murmurs.  Bilateral hand and foot swelling, incipient anasarca Abdomen: Soft, bowel sounds positive Musculoskeletal: No clubbing or cyanosis Skin: No rash seen Neurologic: Sedated  Data Reviewed: I have independently reviewed following labs and imaging studies   CBC: Recent Labs  Lab 09/29/2018 1126  10/13/18 0420  10/14/18 0435  10/14/18 2325  10/15/18 0440 10/15/18 1550 10/15/18 1754 10/16/18 0445 10/16/18 0515  WBC 8.6   < > 11.2*  --  12.2*  --  19.7*  --  20.7*  --   --  11.1*  --   NEUTROABS 8.3*  --   --   --   --   --  12.7*  --   --   --   --   --   --   HGB 14.9   < > 13.5   < > 14.4   < > 12.8*   < > 13.4 14.3 13.6 11.7* 12.2*  HCT 44.1   < > 41.7   < > 45.9   < > 45.6   < > 45.3 42.0 40.0 38.3* 36.0*  MCV 91.3   < > 94.3  --  96.6  --  109.4*  --  102.7*  --   --  100.5*  --   PLT 389   < > 246  --  187  --  209   --  192  --   --  193  --    < > = values in this interval not displayed.   Basic Metabolic Panel: Recent Labs  Lab 10/13/18 0420  10/14/18 0435  10/14/18 2325  10/15/18 0440 10/15/18 1550 10/15/18 1754 10/15/18 1930 10/16/18 0445 10/16/18 0515  NA 134*   < > 137   < > 145   < > 141 142 139  --  145 141  K 4.3   < > 4.6   < > 5.8*   < > 4.8 5.2* 5.1  --  5.3* 4.9  CL 94*  --  97*  --  98  --  99  --   --   --  104  --   CO2 29  --  29  --  34*  --  26  --   --   --  29  --   GLUCOSE 216*  --  150*  --  206*  --  334*  --   --   --  236*  --   BUN 28*  --  22*  --  34*  --  49*  --   --   --  86*  --   CREATININE 0.85  --  0.85  --  2.26*  --  2.72*  --   --   --  3.80*  --   CALCIUM 8.5*  --  8.1*  --  8.3*  --  7.1*  --   --   --  7.2*  --   MG  --   --   --   --  3.3*  --  2.5*  --   --  2.5* 2.5*  --   PHOS  --   --   --   --   --   --  8.7*  --   --  6.6* 5.2*  --    < > = values in this interval not displayed.   GFR: Estimated Creatinine Clearance: 21.6 mL/min (A) (by C-G formula based on SCr of 3.8 mg/dL (H)). Liver Function Tests: Recent Labs  Lab 10/12/18 1323 10/13/18 0420 10/14/18 0435 10/15/18 0440 10/16/18 0445  AST 65* 49* 60* 139* 56*  ALT 60* 57* 64* 101* 69*  ALKPHOS 84 83 108 122 93  BILITOT 0.2* 0.4 0.6 0.8 0.3  PROT 6.5 6.3* 6.5 5.7* 5.2*  ALBUMIN 2.4* 2.4* 2.5* 2.1* 1.8*   No results for input(s): LIPASE, AMYLASE in the last 168 hours. No results for input(s): AMMONIA in the last 168 hours. Coagulation Profile: No results for input(s): INR, PROTIME in the last 168 hours. Cardiac Enzymes: No results for input(s): CKTOTAL, CKMB, CKMBINDEX, TROPONINI in the last 168 hours. BNP (last 3 results) No results for input(s): PROBNP in the last 8760 hours. HbA1C: No results for input(s): HGBA1C in the last 72 hours. CBG: Recent Labs  Lab 10/15/18 1232 10/15/18 1649 10/15/18 1948 10/16/18 0409 10/16/18 0801  GLUCAP 329* 271* 229* 221* 193*    Lipid Profile: No results for input(s): CHOL, HDL, LDLCALC, TRIG, CHOLHDL, LDLDIRECT in the last 72 hours. Thyroid Function Tests: No results for input(s): TSH, T4TOTAL, FREET4, T3FREE, THYROIDAB in the last 72 hours. Anemia Panel: Recent Labs    10/14/18 0435  FERRITIN 172   Urine analysis:    Component Value Date/Time   COLORURINE yellow 12/22/2007 0826   APPEARANCEUR Clear 12/22/2007 0826   LABSPEC 1.025 12/22/2007 0826   PHURINE 6.0 12/22/2007 0826   HGBUR negative 12/22/2007 0826   BILIRUBINUR negative 12/12/2014 1241   KETONESUR negative 12/12/2014 1241   PROTEINUR negative 12/12/2014 1241   UROBILINOGEN 0.2 12/12/2014 1241   UROBILINOGEN 0.2 12/22/2007 0826   NITRITE Negative 12/12/2014 1241   NITRITE negative 12/22/2007 0826   LEUKOCYTESUR Negative 12/12/2014 1241   Sepsis Labs: Invalid input(s): PROCALCITONIN, LACTICIDVEN  Recent Results (from the past 240 hour(s))  SARS Coronavirus 2 [facility] order,  Performed in [facility] hospital lab) Nasopharyngeal Nasopharyngeal Swab     Status: Abnormal   Collection Time: 10/14/2018 11:30 AM   Specimen: Nasopharyngeal Swab  Result Value Ref Range Status   SARS Coronavirus 2 POSITIVE (A) NEGATIVE Final    Comment: RESULT CALLED TO, READ BACK BY AND VERIFIED WITH: RN H HARDY AT 1304 10/04/2018 BY L BENFIELD (NOTE) If result is NEGATIVE SARS-CoV-2 target nucleic acids are NOT DETECTED. The SARS-CoV-2 RNA is generally detectable in upper and lower  respiratory specimens during the acute phase of infection. The lowest  concentration of SARS-CoV-2 viral copies this assay can detect is 250  copies / mL. A negative result does not preclude SARS-CoV-2 infection  and should not be used as the sole basis for treatment or other  patient management decisions.  A negative result may occur with  improper specimen collection / handling, submission of specimen other  than nasopharyngeal swab, presence of viral mutation(s) within the   areas targeted by this assay, and inadequate number of viral copies  (<250 copies / mL). A negative result must be combined with clinical  observations, patient history, and epidemiological information. If result is POSITIVE SARS-CoV-2 target nucleic acids are DETEC TED. The SARS-CoV-2 RNA is generally detectable in upper and lower  respiratory specimens during the acute phase of infection.  Positive  results are indicative of active infection with SARS-CoV-2.  Clinical  correlation with patient history and other diagnostic information is  necessary to determine patient infection status.  Positive results do  not rule out bacterial infection or co-infection with other viruses. If result is PRESUMPTIVE POSTIVE SARS-CoV-2 nucleic acids MAY BE PRESENT.   A presumptive positive result was obtained on the submitted specimen  and confirmed on repeat testing.  While 2019 novel coronavirus  (SARS-CoV-2) nucleic acids may be present in the submitted sample  additional confirmatory testing may be necessary for epidemiological  and / or clinical management purposes  to differentiate between  SARS-CoV-2 and other Sarbecovirus currently known to infect humans.  If clinically indicated additional testing with an alternate test  methodology (LAB7 453) is advised. The SARS-CoV-2 RNA is generally  detectable in upper and lower respiratory specimens during the acute  phase of infection. The expected result is Negative. Fact Sheet for Patients:  StrictlyIdeas.no Fact Sheet for Healthcare Providers: BankingDealers.co.za This test is not yet approved or cleared by the [place] FDA and has been authorized for detection and/or diagnosis of SARS-CoV-2 by FDA under an Emergency Use Authorization (EUA).  This EUA will remain in effect (meaning this test can be used) for the duration of the COVID-19 declaration under Section 564(b)(1) of the Act, 21  U.S.C. section 360bbb-3(b)(1), unless the authorization is terminated or revoked sooner. Performed at [facility], [place] [address], [place], [place] [address]   MRSA PCR Screening     Status: None   Collection Time: 10/12/18  9:05 PM   Specimen: Nasal Mucosa; Nasopharyngeal  Result Value Ref Range Status   MRSA by PCR NEGATIVE NEGATIVE Final    Comment:        The GeneXpert MRSA Assay (FDA approved for NASAL specimens only), is one component of a comprehensive MRSA colonization surveillance program. It is not intended to diagnose MRSA infection nor to guide or monitor treatment for MRSA infections. Performed at [facility], [place] [address], [place], [place] [address]      Radiology Studies: Dg Chest [facility] 1 View  Result Date:  10/14/2018 CLINICAL DATA:  63 year old male status post cardiac arrest. COVID-19. EXAM: PORTABLE CHEST 1 VIEW COMPARISON:  0059 hours earlier today. FINDINGS: Portable AP semi upright view at 2328 hours. Endotracheal tube tip is visible at the level the clavicles. Enteric tube courses to the abdomen with side hole at the level of the gastric body. New pacer or resuscitation pads projecting over the central chest. Some of the right lung apex was not included. Mediastinal contours remain normal. Larger lung volumes and improved bilateral ventilation with decreased patchy and indistinct bilateral pulmonary opacity. No pneumothorax or pleural effusion identified. No acute rib fracture identified. Negative visible bowel gas pattern. IMPRESSION: 1. Satisfactory ET tube and enteric tube placement. 2. Larger lung volumes with improved ventilation from earlier today. No new cardiopulmonary abnormality. Electronically Signed   By: Genevie Ann M.D.   On: 10/14/2018 23:58   Vas [place] Lower Extremity Venous (dvt)  Result Date: 10/15/2018  Lower Venous Study Indications: + PE and COVID -19.  Comparison Study: No priors Performing Technologist: Velva Harman Sturdivant  RDMS, RVT  Examination Guidelines: A complete evaluation includes B-mode imaging, spectral Doppler, color Doppler, and power Doppler as needed of all accessible portions of each vessel. Bilateral testing is considered an integral part of a complete examination. Limited examinations for reoccurring indications may be performed as noted.  +---------+---------------+---------+-----------+----------+-----------------+  RIGHT     Compressibility Phasicity Spontaneity Properties Thrombus Aging     +---------+---------------+---------+-----------+----------+-----------------+  CFV       Full            Yes       Yes                                       +---------+---------------+---------+-----------+----------+-----------------+  SFJ       Full                                                                +---------+---------------+---------+-----------+----------+-----------------+  FV Prox   Full                                                                +---------+---------------+---------+-----------+----------+-----------------+  FV Mid    Full                                                                +---------+---------------+---------+-----------+----------+-----------------+  FV Distal Full                                                                +---------+---------------+---------+-----------+----------+-----------------+  PFV  Full                                                                +---------+---------------+---------+-----------+----------+-----------------+  POP       Partial         Yes       Yes                    Age Indeterminate  +---------+---------------+---------+-----------+----------+-----------------+  PTV       Partial                                          Acute              +---------+---------------+---------+-----------+----------+-----------------+  PERO      None                                             Acute               +---------+---------------+---------+-----------+----------+-----------------+   +---------+---------------+---------+-----------+----------+--------------+  LEFT      Compressibility Phasicity Spontaneity Properties Thrombus Aging  +---------+---------------+---------+-----------+----------+--------------+  CFV       Full            Yes       Yes                                    +---------+---------------+---------+-----------+----------+--------------+  SFJ       Full                                                             +---------+---------------+---------+-----------+----------+--------------+  FV Prox   Full                                                             +---------+---------------+---------+-----------+----------+--------------+  FV Mid    Full                                                             +---------+---------------+---------+-----------+----------+--------------+  FV Distal Full                                                             +---------+---------------+---------+-----------+----------+--------------+  PFV  Full                                                             +---------+---------------+---------+-----------+----------+--------------+  POP       Full                                                             +---------+---------------+---------+-----------+----------+--------------+  PTV       Partial                                          Acute           +---------+---------------+---------+-----------+----------+--------------+  PERO      Partial                                          Acute           +---------+---------------+---------+-----------+----------+--------------+     Summary: Right: Findings consistent with age indeterminate deep vein thrombosis involving the right popliteal vein, right posterior tibial veins, and right peroneal veins. Left: Findings consistent with age indeterminate deep vein thrombosis involving the left  posterior tibial veins, and left peroneal veins.  *See table(s) above for measurements and observations.    Preliminary     The patient is critically ill with multiple organ system failure and requires high complexity decision making for assessment and support, frequent evaluation and titration of therapies, advanced monitoring, review of radiographic studies and interpretation of complex data.    Critical Care Time devoted to patient care services, exclusive of separately billable procedures, described in this note is 35 minutes  Marzetta Board, MD, PhD [facility]  Contact via  [email or web address]  [place] P: [phone] F: [phone]   Family communication: William Hamburger [facility]) [phone] Westley Blass (Brother) [phone] Quang Thorpe (Sister) [phone] Dwana Curd [facility]) [phone] (English fluent)

## 2018-10-16 NOTE — Progress Notes (Signed)
ANTICOAGULATION CONSULT NOTE - Follow Up Consult  Pharmacy Consult for Heparin Indication: pulmonary embolus  No Known Allergies  Patient Measurements: Height: 5\' 3"  (160 cm) Weight: 218 lb 7.6 oz (99.1 kg) IBW/kg (Calculated) : 56.9 Heparin Dosing Weight: 79 kg  Vital Signs: Temp: 98.2 F (36.8 C) (09/19 1200) BP: 118/78 (09/19 1224) Pulse Rate: 69 (09/19 1224)  Labs: Recent Labs    10/14/18 2325  10/15/18 0440  10/15/18 1754 10/15/18 1930 10/16/18 0445 10/16/18 0515 10/16/18 1310  HGB 12.8*   < > 13.4   < > 13.6  --  11.7* 12.2*  --   HCT 45.6   < > 45.3   < > 40.0  --  38.3* 36.0*  --   PLT 209  --  192  --   --   --  193  --   --   HEPARINUNFRC  --   --   --   --   --  1.04* 0.62  --  0.71*  CREATININE 2.26*  --  2.72*  --   --   --  3.80*  --   --   TROPONINIHS 247*  --  308*  --   --   --   --   --   --    < > = values in this interval not displayed.    Estimated Creatinine Clearance: 21.6 mL/min (A) (by C-G formula based on SCr of 3.8 mg/dL (H)).   Assessment: Thomas Gill is a 63 y.o. male presenting on 9/14 with worsening O2 saturation, cough, and diarrhea. Patient was diagnosed with covid last week. On 9/17, patient decompensated requiring intubation. Last night patient had a cardiorespiratory arrest/code blue event that lasted approximately 9 minutes until achieving ROSC.  Patient was not any PTA anticoagulation. When admitted, he was started on anticoagulation ppx  (Enoxparin SQ 0.5 mg/kg q12h). Given recent events, possible PE for cause of rapid decompensation. Starting empiric heparin while further diagnostics are completed.  PM 10/16/18  HL  0.71, upward-trending and supra-therapeutic  Hgb, plt stable   RN reports significant oral bleeding  LE doppler positive for B/L LE VTE  Goal of Therapy:  Heparin level 0.3-0.7 units/ml Monitor platelets by anticoagulation protocol: Yes   Plan:   Decrease heparin infusion to 950 units/hr  Recheck HL in  8 hours  Daily CBC/HL  Continue to monitor for s/s of bleeding   Ulice Dash, PharmD, BCPS Clinical Pharmacist  10/16/2018 2:16 PM

## 2018-10-16 NOTE — Consult Note (Signed)
Renal Service Consult Note [facility]  Thomas Gill 10/16/2018 Thomas Gill Requesting Physician:  Dr Thomas Gill  Reason for Consult:  Renal failure HPI: The patient is a 63 y.o. year-old with hx of DM2 on oral Rx was diagnosed w/ COVID and was staying at home when health called to check on him and he was having SOB.  EMS arrived and sat's were in the 60's. Also c/o cough, RR was shallow and rapid.  Pt was admitted 9/14 to ICU and started remdesivir/ dexamethasone.  Pt sustained cardiac arrest on 9/17 and was resuscitated/ intubated. Started on levo gtt for hypotension.  Subsequently has developed rising creat/ BUN. UOP has been stable however around 1 L per day. Asked to see for renal failure.   No hx obtained from patient, history obtained from PMD and chart.  ROS- n/a   Past Medical History  Past Medical History:  Diagnosis Date  . Allergy   . Diabetes mellitus type 2 in obese [facility])    Past Surgical History  Past Surgical History:  Procedure Laterality Date  . FRACTURE SURGERY     Family History History reviewed. No pertinent family history. Social History  reports that he has quit smoking. His smoking use included cigarettes. He smoked 1.00 pack per day. He has never used smokeless tobacco. He reports that he does not drink alcohol or use drugs. Allergies No Known Allergies Home medications Prior to Admission medications   Medication Sig Start Date End Date Taking? Authorizing Provider  acetaminophen (TYLENOL) 100 MG/ML solution Take 10 mg/kg by mouth every 4 (four) hours as needed for fever.   Yes [provider]  glipiZIDE (GLUCOTROL) 5 MG tablet Take 5 mg by mouth every morning. 10/02/18  Yes [provider]  lovastatin (MEVACOR) 20 MG tablet Take 20 mg by mouth at bedtime. 09/24/18  Yes [provider]  metFORMIN (GLUCOPHAGE) 1000 MG tablet Take 1,000 mg by mouth 2 (two) times daily with a meal. 08/18/18  Yes [provider]   tamsulosin (FLOMAX) 0.4 MG CAPS capsule Take 0.4 mg by mouth daily. 10/02/18  Yes [provider]  ibuprofen (ADVIL,MOTRIN) 100 MG chewable tablet Chew by mouth every 8 (eight) hours as needed.    [provider]   Liver Function Tests Recent Labs  Lab 10/14/18 0435 10/15/18 0440 10/16/18 0445  AST 60* 139* 56*  ALT 64* 101* 69*  ALKPHOS 108 122 93  BILITOT 0.6 0.8 0.3  PROT 6.5 5.7* 5.2*  ALBUMIN 2.5* 2.1* 1.8*   No results for input(s): LIPASE, AMYLASE in the last 168 hours. CBC Recent Labs  Lab 10/10/2018 1126  10/14/18 2325  10/15/18 0440  10/15/18 1754 10/16/18 0445 10/16/18 0515  WBC 8.6   < > 19.7*  --  20.7*  --   --  11.1*  --   NEUTROABS 8.3*  --  12.7*  --   --   --   --   --   --   HGB 14.9   < > 12.8*   < > 13.4   < > 13.6 11.7* 12.2*  HCT 44.1   < > 45.6   < > 45.3   < > 40.0 38.3* 36.0*  MCV 91.3   < > 109.4*  --  102.7*  --   --  100.5*  --   PLT 389   < > 209  --  192  --   --  193  --    < > =  values in this interval not displayed.   Basic Metabolic Panel Recent Labs  Lab 10/23/2018 1126 10/09/2018 1415 10/12/18 1323 10/13/18 0420  10/14/18 0435  10/14/18 2325 10/15/18 0335 10/15/18 0440 10/15/18 1550 10/15/18 1754 10/15/18 1930 10/16/18 0445 10/16/18 0515 10/16/18 1650  NA 131*  --  133* 134*   < > 137   < > 145 139 141 142 139  --  145 141  --   K 4.1  --  3.9 4.3   < > 4.6   < > 5.8* 5.2* 4.8 5.2* 5.1  --  5.3* 4.9  --   CL 90*  --  93* 94*  --  97*  --  98  --  99  --   --   --  104  --   --   CO2 22  --  28 29  --  29  --  34*  --  26  --   --   --  29  --   --   GLUCOSE 285*  --  289* 216*  --  150*  --  206*  --  334*  --   --   --  236*  --   --   BUN 36*  --  30* 28*  --  22*  --  34*  --  49*  --   --   --  86*  --   --   CREATININE 1.29* 1.07 0.81 0.85  --  0.85  --  2.26*  --  2.72*  --   --   --  3.80*  --   --   CALCIUM 8.2*  --  8.4* 8.5*  --  8.1*  --  8.3*  --  7.1*  --   --   --  7.2*  --   --   PHOS  --   --    --   --   --   --   --   --   --  8.7*  --   --  6.6* 5.2*  --  5.2*   < > = values in this interval not displayed.   Iron/TIBC/Ferritin/ %Sat    Component Value Date/Time   FERRITIN 172 10/14/2018 0435    Vitals:   10/16/18 1600 10/16/18 1700 10/16/18 1800 10/16/18 2015  BP:  121/74 126/75 127/74  Pulse: 69 69 70 75  Resp: (!) 34 (!) 34 (!) 34   Temp: 98.2 F (36.8 C) 98.2 F (36.8 C) 98.4 F (36.9 C)   TempSrc:      SpO2: (!) 85% 90% 91% 93%  Weight:      Height:        Exam  Patient not examined directly given COVID-19 + status, utilizing exam of the primary team and observations of RN's.     Home meds:  - glipizide 5 qam/ metformin 1gm bid  - tamsulosin 0.4 qd  - lovastatin 20 hs  - prn ibuprofen tid    I/O 9/14 - 10/16/18 >>   17 L in and 6 L out = +11 L   Wt's = admit 93, peak 99kg, today 99kg    Na 145 K 5.3  CO2 29  BUN 86  Cr 3.80   eGFR 16- 19  Ca 7.2  Alb 1.8  AST 56/ ALT 69  Tbili 0.3    WBC 11k  Hb 11.7  plt 193  Ddimer 18.02  CXR 9/14 - mult bilateral infiltrates    CXR 9/17 - "larger lung volumes and improved bilateral ventilation with decreased patchy and indistinct bilateral pulmonary opacity."     UA - not done  Assessment/ Plan: 1. Acute renal failure - subsequent to cardiac arrest and post arrest hypotension/ shock that was relatively short-lived.  Was on pressors for about 24 hrs and has been off for about 24 hrs now.  UOP is good. Creat rising. Meds reviewed.   No indication for RRT at this time.  Check UA, urine lytes. Not sure if renal US is available at [facility].  Will follow.  2. COVID -19 PNA / ARDS/ acute hypoxic resp failure- on vent, 70% FiO2 3. Vol excess -   4. DVT - of bilat LE's, on IV heparin 5. Cardiac arrest - on 9/17, 9 min to ROSC 6. DM - on SSI      Rob Thomas Cowley  MD 10/16/2018, 10:57 PM

## 2018-10-17 ENCOUNTER — Inpatient Hospital Stay (HOSPITAL_COMMUNITY): Payer: Medicaid Other

## 2018-10-17 DIAGNOSIS — U071 COVID-19: Secondary | ICD-10-CM | POA: Diagnosis not present

## 2018-10-17 DIAGNOSIS — J9811 Atelectasis: Secondary | ICD-10-CM | POA: Diagnosis not present

## 2018-10-17 DIAGNOSIS — J9601 Acute respiratory failure with hypoxia: Secondary | ICD-10-CM | POA: Diagnosis not present

## 2018-10-17 DIAGNOSIS — N179 Acute kidney failure, unspecified: Secondary | ICD-10-CM | POA: Diagnosis not present

## 2018-10-17 DIAGNOSIS — J80 Acute respiratory distress syndrome: Secondary | ICD-10-CM | POA: Diagnosis not present

## 2018-10-17 LAB — COMPREHENSIVE METABOLIC PANEL
ALT: 59 U/L — ABNORMAL HIGH (ref 0–44)
AST: 38 U/L (ref 15–41)
Albumin: 1.9 g/dL — ABNORMAL LOW (ref 3.5–5.0)
Alkaline Phosphatase: 100 U/L (ref 38–126)
Anion gap: 10 (ref 5–15)
BUN: 108 mg/dL — ABNORMAL HIGH (ref 6–20)
CO2: 29 mmol/L (ref 22–32)
Calcium: 7.9 mg/dL — ABNORMAL LOW (ref 8.9–10.3)
Chloride: 104 mmol/L (ref 98–111)
Creatinine, Ser: 4.4 mg/dL — ABNORMAL HIGH (ref 0.61–1.24)
GFR calc Af Amer: 16 mL/min — ABNORMAL LOW (ref >60)
GFR calc non Af Amer: 14 mL/min — ABNORMAL LOW (ref >60)
Glucose, Bld: 267 mg/dL — ABNORMAL HIGH (ref 70–99)
Potassium: 5.6 mmol/L — ABNORMAL HIGH (ref 3.5–5.1)
Sodium: 143 mmol/L (ref 135–145)
Total Bilirubin: 0.2 mg/dL — ABNORMAL LOW (ref 0.3–1.2)
Total Protein: 5.7 g/dL — ABNORMAL LOW (ref 6.5–8.1)

## 2018-10-17 LAB — CBC
HCT: 38.6 % — ABNORMAL LOW (ref 39.0–52.0)
Hemoglobin: 11.9 g/dL — ABNORMAL LOW (ref 13.0–17.0)
MCH: 30.5 pg (ref 26.0–34.0)
MCHC: 30.8 g/dL (ref 30.0–36.0)
MCV: 99 fL (ref 80.0–100.0)
Platelets: 232 10*3/uL (ref 150–400)
RBC: 3.9 MIL/uL — ABNORMAL LOW (ref 4.22–5.81)
RDW: 14.6 % (ref 11.5–15.5)
WBC: 13.4 10*3/uL — ABNORMAL HIGH (ref 4.0–10.5)
nRBC: 0.8 % — ABNORMAL HIGH (ref 0.0–0.2)

## 2018-10-17 LAB — GLUCOSE, CAPILLARY
Glucose-Capillary: 195 mg/dL — ABNORMAL HIGH (ref 70–99)
Glucose-Capillary: 197 mg/dL — ABNORMAL HIGH (ref 70–99)
Glucose-Capillary: 203 mg/dL — ABNORMAL HIGH (ref 70–99)
Glucose-Capillary: 246 mg/dL — ABNORMAL HIGH (ref 70–99)
Glucose-Capillary: 251 mg/dL — ABNORMAL HIGH (ref 70–99)
Glucose-Capillary: 267 mg/dL — ABNORMAL HIGH (ref 70–99)

## 2018-10-17 LAB — URINALYSIS, ROUTINE W REFLEX MICROSCOPIC
Bilirubin Urine: NEGATIVE
Glucose, UA: 150 mg/dL — AB
Ketones, ur: NEGATIVE mg/dL
Nitrite: NEGATIVE
Protein, ur: NEGATIVE mg/dL
Specific Gravity, Urine: 1.013 (ref 1.005–1.030)
pH: 5 (ref 5.0–8.0)

## 2018-10-17 LAB — MAGNESIUM: Magnesium: 2.5 mg/dL — ABNORMAL HIGH (ref 1.7–2.4)

## 2018-10-17 LAB — SODIUM, URINE, RANDOM: Sodium, Ur: 73 mmol/L

## 2018-10-17 LAB — CREATININE, URINE, RANDOM: Creatinine, Urine: 67.38 mg/dL

## 2018-10-17 LAB — PHOSPHORUS: Phosphorus: 4.6 mg/dL (ref 2.5–4.6)

## 2018-10-17 MED ORDER — SODIUM ZIRCONIUM CYCLOSILICATE 10 G PO PACK
10.0000 g | PACK | Freq: Once | ORAL | Status: AC
  Administered 2018-10-17: 10 g via ORAL
  Filled 2018-10-17: qty 1

## 2018-10-17 MED ORDER — HEPARIN (PORCINE) 25000 UT/250ML-% IV SOLN
800.0000 [IU]/h | INTRAVENOUS | Status: DC
  Administered 2018-10-17: 800 [IU]/h via INTRAVENOUS
  Filled 2018-10-17: qty 250

## 2018-10-17 NOTE — Progress Notes (Signed)
NAME:  Thomas Gill, MRN:  [id number], DOB:  Nov 12, 1955, LOS: 6 ADMISSION DATE:  10/23/2018, CONSULTATION DATE:  9/15 REFERRING MD:  [place] Constable, CHIEF COMPLAINT:  dyspnea   Brief History   63 y/o male with obesity, DM2 admitted with COVID Pneumonia.   Past Medical History  Former smoker DM2  Significant Hospital Events   9/14 admission 9/17 intubated, prolonged.  Cardiac arrest later in day while he was in prone position.  Central line placed.  Started on Levophed drip 9/18- Paralyzed for vent dysynchrony 9/19- Started heparin for DVT  Consults:  PCCM  Procedures:  ETT 9/17>>> Femoral CVL 9/17 >> 9/20 Lt IJ CVL 9/20 >>  Significant Diagnostic Tests:  Lower extremity duplex 9/19> age indeterminate bilateral DVT  Micro Data:  9/14 SARS COV 2 positive  Antimicrobials/COVID Rx  9/14 Decadron >  9/14 Remdesivir >    Interim history/subjective:  Heparin drip held overnight for oral bleeding Off Levophed.  Making some urine  Objective   Blood pressure (!) 144/77, pulse 83, temperature 100 F (37.8 C), resp. rate (!) 34, height 5\' 3"  (1.6 m), weight 99.2 kg, SpO2 93 %.    Vent Mode: PRVC FiO2 (%):  [60 %-70 %] 70 % Set Rate:  [34 bmp] 34 bmp Vt Set:  [390 mL] 390 mL PEEP:  [12 cmH20] 12 cmH20 Plateau Pressure:  [22 cmH20-26 cmH20] 26 cmH20   Intake/Output Summary (Last 24 hours) at 10/17/2018 0756 Last data filed at 10/17/2018 0600 Gross per 24 hour  Intake 3588.73 ml  Output 2100 ml  Net 1488.73 ml   Filed Weights   10/14/18 0500 10/16/18 0500 10/17/18 0450  Weight: 92.3 kg 99.1 kg 99.2 kg   Examination: Blood pressure (!) 144/77, pulse 83, temperature 100 F (37.8 C), resp. rate (!) 34, height 5\' 3"  (1.6 m), weight 99.2 kg, SpO2 93 %. Gen:      No acute distress HEENT:  EOMI, sclera anicteric, ETT Neck:     No masses; no thyromegaly Lungs:    Clear to auscultation bilaterally; normal respiratory effort CV:         Regular rate and rhythm; no murmurs Abd:       + bowel sounds; soft, non-tender; no palpable masses, no distension Ext:    No edema; adequate peripheral perfusion Skin:      Warm and dry; no rash Neuro: Sedated, unresponsive  Resolved Hospital Problem list     Assessment & Plan:  ARDS due to COVID 19 pneumonia Continue decadron Maintain on full vent support Of proning due to cardiac arrest and hemodynamic instability We will wean off paralytics today. PRVC ventilation, 7 cc/kg Target driving pressure of less than 15.  Cardiac arrest, lactic acidosis Off pressors. Telemetry monitoring  Lower extremity DVT Heparin drip on hold We will try to resume low-dose today.  Sedation: Fentanyl and versed drips to continue  Acute kidney injury Supportive care.  Monitor urine output and creatinine. Holding off CRRT per nephrology.  Transaminitis, shock liver Trend LFT  Best practice:  Diet: regular diet Pain/Anxiety/Delirium protocol (if indicated): n/a VAP protocol (if indicated): n/a DVT prophylaxis: Heparin drip GI prophylaxis: Pepcid Glucose control:SSI, lantus Mobility: Bedrest Code Status: full Family Communication: Per TRH Disposition: remain in ICU  Labs   CBC: Recent Labs  Lab 10/05/2018 1126  10/14/18 0435  10/14/18 2325  10/15/18 0440 10/15/18 1550 10/15/18 1754 10/16/18 0445 10/16/18 0515 10/17/18 0350  WBC 8.6   < > 12.2*  --  19.7*  --  20.7*  --   --  11.1*  --  13.4*  NEUTROABS 8.3*  --   --   --  12.7*  --   --   --   --   --   --   --   HGB 14.9   < > 14.4   < > 12.8*   < > 13.4 14.3 13.6 11.7* 12.2* 11.9*  HCT 44.1   < > 45.9   < > 45.6   < > 45.3 42.0 40.0 38.3* 36.0* 38.6*  MCV 91.3   < > 96.6  --  109.4*  --  102.7*  --   --  100.5*  --  99.0  PLT 389   < > 187  --  209  --  192  --   --  193  --  232   < > = values in this interval not displayed.    Basic Metabolic Panel: Recent Labs  Lab 10/14/18 0435  10/14/18 2325  10/15/18 0440 10/15/18 1550 10/15/18 1754 10/15/18 1930  10/16/18 0445 10/16/18 0515 10/16/18 1650 10/17/18 0350  NA 137   < > 145   < > 141 142 139  --  145 141  --  143  K 4.6   < > 5.8*   < > 4.8 5.2* 5.1  --  5.3* 4.9  --  5.6*  CL 97*  --  98  --  99  --   --   --  104  --   --  104  CO2 29  --  34*  --  26  --   --   --  29  --   --  29  GLUCOSE 150*  --  206*  --  334*  --   --   --  236*  --   --  267*  BUN 22*  --  34*  --  49*  --   --   --  86*  --   --  108*  CREATININE 0.85  --  2.26*  --  2.72*  --   --   --  3.80*  --   --  4.40*  CALCIUM 8.1*  --  8.3*  --  7.1*  --   --   --  7.2*  --   --  7.9*  MG  --    < > 3.3*  --  2.5*  --   --  2.5* 2.5*  --  2.5* 2.5*  PHOS  --   --   --   --  8.7*  --   --  6.6* 5.2*  --  5.2* 4.6   < > = values in this interval not displayed.   GFR: Estimated Creatinine Clearance: 18.6 mL/min (A) (by C-G formula based on SCr of 4.4 mg/dL (H)). Recent Labs  Lab 2018-10-18 1126 10/18/18 1135  2018-10-18 1420  10/14/18 2325 10/15/18 0440 10/16/18 0445 10/17/18 0350  PROCALCITON 0.19  --   --   --   --   --   --   --   --   WBC 8.6  --    < >  --    < > 19.7* 20.7* 11.1* 13.4*  LATICACIDVEN  --  2.8*  --  2.1*  --   --  3.4*  --   --    < > = values in this interval not displayed.    Liver Function Tests: Recent  Labs  Lab 10/13/18 0420 10/14/18 0435 10/15/18 0440 10/16/18 0445 10/17/18 0350  AST 49* 60* 139* 56* 38  ALT 57* 64* 101* 69* 59*  ALKPHOS 83 108 122 93 100  BILITOT 0.4 0.6 0.8 0.3 0.2*  PROT 6.3* 6.5 5.7* 5.2* 5.7*  ALBUMIN 2.4* 2.5* 2.1* 1.8* 1.9*   No results for input(s): LIPASE, AMYLASE in the last 168 hours. No results for input(s): AMMONIA in the last 168 hours.  ABG    Component Value Date/Time   PHART 7.286 (L) 10/16/2018 0515   PCO2ART 60.6 (H) 10/16/2018 0515   PO2ART 75.0 (L) 10/16/2018 0515   HCO3 28.9 (H) 10/16/2018 0515   TCO2 31 10/16/2018 0515   ACIDBASEDEF 1.0 10/15/2018 1550   O2SAT 92.0 10/16/2018 0515     Coagulation Profile: No results for  input(s): INR, PROTIME in the last 168 hours.  Cardiac Enzymes: No results for input(s): CKTOTAL, CKMB, CKMBINDEX, TROPONINI in the last 168 hours.  HbA1C: Hgb A1c MFr Bld  Date/Time Value Ref Range Status  10/19/2018 02:30 PM 9.1 (H) 4.8 - 5.6 % Final    Comment:    (NOTE)         Prediabetes: 5.7 - 6.4         Diabetes: >6.4         Glycemic control for adults with diabetes: <7.0     CBG: Recent Labs  Lab 10/16/18 1232 10/16/18 1625 10/16/18 2033 10/17/18 0002 10/17/18 0340  GLUCAP 227* 232* 223* 267* 251*   The patient is critically ill with multiple organ system failure and requires high complexity decision making for assessment and support, frequent evaluation and titration of therapies, advanced monitoring, review of radiographic studies and interpretation of complex data.   Critical Care Time devoted to patient care services, exclusive of separately billable procedures, described in this note is 35 minutes.   Chilton GreathousePraveen Avacyn Kloosterman MD  Pulmonary and Critical Care Pager [phone] If no answer call [phone] 10/17/2018, 7:59 AM

## 2018-10-17 NOTE — Progress Notes (Signed)
Bedside bronchoscopy performed.  Intervention BAL.  Bedside bronch performed by MD for collapsed left lung due to mucus plugging.  Moderate to large amount of bloody secretions suctioned from airway.  Sample sent to the lab.  No complications noted.

## 2018-10-17 NOTE — Progress Notes (Addendum)
ANTICOAGULATION CONSULT NOTE - Follow Up Consult  Pharmacy Consult for Heparin Indication: pulmonary embolus  No Known Allergies  Patient Measurements: Height: 5\' 3"  (160 cm) Weight: 218 lb 11.1 oz (99.2 kg) IBW/kg (Calculated) : 56.9 Heparin Dosing Weight: 79 kg  Vital Signs: Temp: 100 F (37.8 C) (09/20 0600) Temp Source: Esophageal (09/20 0400) BP: 150/81 (09/20 0807) Pulse Rate: 81 (09/20 0807)  Labs: Recent Labs    10/14/18 2325  10/15/18 0440  10/15/18 1930 10/16/18 0445 10/16/18 0515 10/16/18 1310 10/17/18 0350  HGB 12.8*   < > 13.4   < >  --  11.7* 12.2*  --  11.9*  HCT 45.6   < > 45.3   < >  --  38.3* 36.0*  --  38.6*  PLT 209  --  192  --   --  193  --   --  232  HEPARINUNFRC  --   --   --   --  1.04* 0.62  --  0.71*  --   CREATININE 2.26*  --  2.72*  --   --  3.80*  --   --  4.40*  TROPONINIHS 247*  --  308*  --   --   --   --   --   --    < > = values in this interval not displayed.    Estimated Creatinine Clearance: 18.6 mL/min (A) (by C-G formula based on SCr of 4.4 mg/dL (H)).   Assessment: Thomas Gill is a 63 y.o. male presenting on 9/14 with worsening O2 saturation, cough, and diarrhea. Patient was diagnosed with covid last week. On 9/17, patient decompensated requiring intubation. Last night patient had a cardiorespiratory arrest/code blue event that lasted approximately 9 minutes until achieving ROSC.  Patient was not any PTA anticoagulation but was started on IV heparin for acute bilateral DVT's. Heparin was paused yesterday given oral bleeding which has resolved. Per MD, okay to resume IV heparin at 1800 today. H/H low stable, Plt wnl   Goal of Therapy:  Heparin level 0.3-0.5 units/ml Monitor platelets by anticoagulation protocol: Yes   Plan:   Restart heparin infusion at 800 units/hr at [facility] today. Will avoid boluses given bleeding   Recheck HL in 8 hours  Daily CBC/HL  Continue to monitor for s/s of bleeding   Albertina Parr,  PharmD., BCPS Clinical Pharmacist Clinical phone for 10/17/2018 until 5pm: [phone]

## 2018-10-17 NOTE — Progress Notes (Signed)
PROGRESS NOTE  Thomas Gill HLK:[id number] DOB: 02/02/1955 DOA: 10/03/2018 PCP: Patient, No Pcp Per   LOS: 6 days   Brief Narrative / Interim history: Thomas Gill is an 63 y.o. male diagnosed with COVID-19 last week test on 10/16/2018 was positive, presents to the ED with progressive shortness of breath, cough and diarrhea.  When EMS arrived at his house he was satting in the 60s on room air.  Patient was admitted to the ICU and intubated within 24 hours of arrival.  Significant events: 9/14 admitted 9/15 intubated 9/17 cardiopulmonary arrest, asystole, ROSC in 9 minutes per chart 9/17 femoral line 9/18 DVT found, started on heparin 9/20 DC femoral line, insert left IJ 9/20 bronchoscopy for mucous plugging  Subjective/interval events: -overnight had some bleeding from his mouth and ETT, heparin gtt stopped about 8 pm last night  Assessment & Plan: Active Problems:   COVID-19 virus infection   Acute respiratory failure with hypoxia (HCC)   AKI (acute kidney injury) ([place])  Principal Problem ARDS / Acute hypoxic respiratory failure due to Covid 19 pneumonia -tolerating PRVC this morning, further sedation/vent management guided by PCCM -Worsening respiratory status this morning, bloody secretions from the ET tube.  Chest x-ray with complete opacification of the left hemithorax, underwent a bronchoscopy by critical care was found to have significant mucus plugging  Vent Mode: PRVC FiO2 (%):  [60 %-100 %] 100 % Set Rate:  [34 bmp] 34 bmp Vt Set:  [390 mL] 390 mL PEEP:  [12 cmH20-16 cmH20] 16 cmH20 Plateau Pressure:  [22 cmH20-26 cmH20] 24 cmH20 -Completed Remdesivir on 9/18 -Continue Decadron  COVID-19 Labs  Recent Labs    10/16/18 0445  DDIMER 18.02*  CRP 26.1*    Lab Results  Component Value Date   SARSCOV2NAA POSITIVE (A) 10/01/2018   Active Problems Cardiac arrest/asystole -On 9/17, ROSC in 9 minutes, epi x3, 2 A of bicarb, short run of A. fib with RVR then normal  sinus rhythm -Cardiac arrest possibly caused by PE is lower extremity Dopplers showed DVTs bilaterally  Hemoptisis -Possibly related to PE/as patient is on anticoagulation, also significant mucus plugging as above.  Continue to closely monitor -Resume heparin without bolus today and closely monitor  Acute DVT -Lower extremity ultrasound obtained on 10/15/2018 showed age-indeterminate DVTs bilaterally.  Have to assume these are acute possibly causing a PE which led to his cardiac arrest.  Unable to obtain a CT angiogram due to renal failure -Heparin interrupted last night due to hemoptysis, resumed today after exchange of central line  Acute kidney injury -This is following cardiac arrest.  -Urine output improving in the last 24 hours, outs 2.1 L, creatinine and BUN worsening but may reach a plateau soon -Nephrology consulted, no need for CRRT just yet, -We will discuss again with nephrology role for Lasix at this point as he is +11 L  Elevated LFTs, shock liver -Shock liver following cardiac arrest.  LFTs improving  DM2 with hyperglycemia -continue insulin, hold home agents, CBGs as below  CBG (last 3)  Recent Labs    10/17/18 0002 10/17/18 0340 10/17/18 0749  GLUCAP 267* 251* 203*    Scheduled Meds: . artificial tears  1 application Both Eyes T3S  . chlorhexidine gluconate (MEDLINE KIT)  15 mL Mouth Rinse BID  . Chlorhexidine Gluconate Cloth  6 each Topical Daily  . dexamethasone (DECADRON) injection  6 mg Intravenous Q12H  . famotidine  20 mg Oral Daily  . feeding supplement (PRO-STAT SUGAR FREE 64)  60  mL Per Tube BID  . feeding supplement (VITAL HIGH PROTEIN)  1,000 mL Per Tube Q24H  . fentaNYL (SUBLIMAZE) injection  50 mcg Intravenous Once  . insulin aspart  0-20 Units Subcutaneous Q4H  . insulin detemir  10 Units Subcutaneous BID  . mouth rinse  15 mL Mouth Rinse 10 times per day  . multivitamin with minerals  1 tablet Per Tube Daily  . polyethylene glycol  17 g Per  Tube BID  . sennosides  5 mL Per Tube BID  . sodium zirconium cyclosilicate  10 g Oral Once   Continuous Infusions: . sodium chloride Stopped (10/15/18 0343)  . fentaNYL infusion INTRAVENOUS 200 mcg/hr (10/17/18 0600)  . heparin Stopped (10/16/18 2021)  . lactated ringers 75 mL/hr at 10/17/18 0732  . midazolam 4 mg/hr (10/17/18 0902)  . norepinephrine (LEVOPHED) Adult infusion Stopped (10/16/18 0111)  . vecuronium (NORCURON) infusion 0.7 mcg/kg/min (10/17/18 0902)   PRN Meds:.  DVT prophylaxis: Lovenox Code Status: Full code Family Communication: We will discuss with family in [place] this afternoon, Dwana Curd speaks [place], [phone] Disposition Plan: remain in ICU  Consultants:   PCCM  Antimicrobials:  None    Objective: Vitals:   10/17/18 0450 10/17/18 0500 10/17/18 0600 10/17/18 0807  BP:  (!) 145/79 (!) 144/77 (!) 150/81  Pulse:  93 83 81  Resp:  (!) 34 (!) 34 (!) 34  Temp:  100.2 F (37.9 C) 100 F (37.8 C)   TempSrc:      SpO2:  91% 93% 92%  Weight: 99.2 kg     Height:        Intake/Output Summary (Last 24 hours) at 10/17/2018 1148 Last data filed at 10/17/2018 0600 Gross per 24 hour  Intake 2854.88 ml  Output 1750 ml  Net 1104.88 ml   Filed Weights   10/14/18 0500 10/16/18 0500 10/17/18 0450  Weight: 92.3 kg 99.1 kg 99.2 kg    Examination:  Constitutional: Sedated on the vent ENMT: ETT in place Neck: normal, supple Respiratory: Coarse breath sounds bilaterally, no wheezing, breathing with the vent Cardiovascular: Regular rate and rhythm, no murmurs.  Incipient anasarca throughout Abdomen: Soft, nondistended, bowel sounds positive Musculoskeletal: No clubbing or cyanosis Skin: No rash seen Neurologic: Sedated  Data Reviewed: I have independently reviewed following labs and imaging studies   CBC: Recent Labs  Lab 10/24/2018 1126  10/14/18 0435  10/14/18 2325  10/15/18 0440 10/15/18 1550 10/15/18 1754 10/16/18 0445 10/16/18  0515 10/17/18 0350  WBC 8.6   < > 12.2*  --  19.7*  --  20.7*  --   --  11.1*  --  13.4*  NEUTROABS 8.3*  --   --   --  12.7*  --   --   --   --   --   --   --   HGB 14.9   < > 14.4   < > 12.8*   < > 13.4 14.3 13.6 11.7* 12.2* 11.9*  HCT 44.1   < > 45.9   < > 45.6   < > 45.3 42.0 40.0 38.3* 36.0* 38.6*  MCV 91.3   < > 96.6  --  109.4*  --  102.7*  --   --  100.5*  --  99.0  PLT 389   < > 187  --  209  --  192  --   --  193  --  232   < > = values in this interval not displayed.  Basic Metabolic Panel: Recent Labs  Lab 10/14/18 0435  10/14/18 2325  10/15/18 0440 10/15/18 1550 10/15/18 1754 10/15/18 1930 10/16/18 0445 10/16/18 0515 10/16/18 1650 10/17/18 0350  NA 137   < > 145   < > 141 142 139  --  145 141  --  143  K 4.6   < > 5.8*   < > 4.8 5.2* 5.1  --  5.3* 4.9  --  5.6*  CL 97*  --  98  --  99  --   --   --  104  --   --  104  CO2 29  --  34*  --  26  --   --   --  29  --   --  29  GLUCOSE 150*  --  206*  --  334*  --   --   --  236*  --   --  267*  BUN 22*  --  34*  --  49*  --   --   --  86*  --   --  108*  CREATININE 0.85  --  2.26*  --  2.72*  --   --   --  3.80*  --   --  4.40*  CALCIUM 8.1*  --  8.3*  --  7.1*  --   --   --  7.2*  --   --  7.9*  MG  --    < > 3.3*  --  2.5*  --   --  2.5* 2.5*  --  2.5* 2.5*  PHOS  --   --   --   --  8.7*  --   --  6.6* 5.2*  --  5.2* 4.6   < > = values in this interval not displayed.   GFR: Estimated Creatinine Clearance: 18.6 mL/min (A) (by C-G formula based on SCr of 4.4 mg/dL (H)). Liver Function Tests: Recent Labs  Lab 10/13/18 0420 10/14/18 0435 10/15/18 0440 10/16/18 0445 10/17/18 0350  AST 49* 60* 139* 56* 38  ALT 57* 64* 101* 69* 59*  ALKPHOS 83 108 122 93 100  BILITOT 0.4 0.6 0.8 0.3 0.2*  PROT 6.3* 6.5 5.7* 5.2* 5.7*  ALBUMIN 2.4* 2.5* 2.1* 1.8* 1.9*   No results for input(s): LIPASE, AMYLASE in the last 168 hours. No results for input(s): AMMONIA in the last 168 hours. Coagulation Profile: No results for  input(s): INR, PROTIME in the last 168 hours. Cardiac Enzymes: No results for input(s): CKTOTAL, CKMB, CKMBINDEX, TROPONINI in the last 168 hours. BNP (last 3 results) No results for input(s): PROBNP in the last 8760 hours. HbA1C: No results for input(s): HGBA1C in the last 72 hours. CBG: Recent Labs  Lab 10/16/18 1625 10/16/18 2033 10/17/18 0002 10/17/18 0340 10/17/18 0749  GLUCAP 232* 223* 267* 251* 203*   Lipid Profile: No results for input(s): CHOL, HDL, LDLCALC, TRIG, CHOLHDL, LDLDIRECT in the last 72 hours. Thyroid Function Tests: No results for input(s): TSH, T4TOTAL, FREET4, T3FREE, THYROIDAB in the last 72 hours. Anemia Panel: No results for input(s): VITAMINB12, FOLATE, FERRITIN, TIBC, IRON, RETICCTPCT in the last 72 hours. Urine analysis:    Component Value Date/Time   COLORURINE yellow 12/22/2007 0826   APPEARANCEUR Clear 12/22/2007 0826   LABSPEC 1.025 12/22/2007 0826   PHURINE 6.0 12/22/2007 0826   HGBUR negative 12/22/2007 0826   BILIRUBINUR negative 12/12/2014 1241   KETONESUR negative 12/12/2014 1241   PROTEINUR negative 12/12/2014 1241   UROBILINOGEN  0.2 12/12/2014 1241   UROBILINOGEN 0.2 12/22/2007 0826   NITRITE Negative 12/12/2014 1241   NITRITE negative 12/22/2007 0826   LEUKOCYTESUR Negative 12/12/2014 1241   Sepsis Labs: Invalid input(s): PROCALCITONIN, LACTICIDVEN  Recent Results (from the past 240 hour(s))  SARS Coronavirus 2 [facility] order, Performed in [facility] hospital lab) Nasopharyngeal Nasopharyngeal Swab     Status: Abnormal   Collection Time: 10/08/2018 11:30 AM   Specimen: Nasopharyngeal Swab  Result Value Ref Range Status   SARS Coronavirus 2 POSITIVE (A) NEGATIVE Final    Comment: RESULT CALLED TO, READ BACK BY AND VERIFIED WITH: RN H HARDY AT 1304 10/13/2018 BY L BENFIELD (NOTE) If result is NEGATIVE SARS-CoV-2 target nucleic acids are NOT DETECTED. The SARS-CoV-2 RNA is generally detectable in upper and lower  respiratory  specimens during the acute phase of infection. The lowest  concentration of SARS-CoV-2 viral copies this assay can detect is 250  copies / mL. A negative result does not preclude SARS-CoV-2 infection  and should not be used as the sole basis for treatment or other  patient management decisions.  A negative result may occur with  improper specimen collection / handling, submission of specimen other  than nasopharyngeal swab, presence of viral mutation(s) within the  areas targeted by this assay, and inadequate number of viral copies  (<250 copies / mL). A negative result must be combined with clinical  observations, patient history, and epidemiological information. If result is POSITIVE SARS-CoV-2 target nucleic acids are DETEC TED. The SARS-CoV-2 RNA is generally detectable in upper and lower  respiratory specimens during the acute phase of infection.  Positive  results are indicative of active infection with SARS-CoV-2.  Clinical  correlation with patient history and other diagnostic information is  necessary to determine patient infection status.  Positive results do  not rule out bacterial infection or co-infection with other viruses. If result is PRESUMPTIVE POSTIVE SARS-CoV-2 nucleic acids MAY BE PRESENT.   A presumptive positive result was obtained on the submitted specimen  and confirmed on repeat testing.  While 2019 novel coronavirus  (SARS-CoV-2) nucleic acids may be present in the submitted sample  additional confirmatory testing may be necessary for epidemiological  and / or clinical management purposes  to differentiate between  SARS-CoV-2 and other Sarbecovirus currently known to infect humans.  If clinically indicated additional testing with an alternate test  methodology (LAB7 453) is advised. The SARS-CoV-2 RNA is generally  detectable in upper and lower respiratory specimens during the acute  phase of infection. The expected result is Negative. Fact Sheet for  Patients:  StrictlyIdeas.no Fact Sheet for Healthcare Providers: BankingDealers.co.za This test is not yet approved or cleared by the [place] FDA and has been authorized for detection and/or diagnosis of SARS-CoV-2 by FDA under an Emergency Use Authorization (EUA).  This EUA will remain in effect (meaning this test can be used) for the duration of the COVID-19 declaration under Section 564(b)(1) of the Act, 21 U.S.C. section 360bbb-3(b)(1), unless the authorization is terminated or revoked sooner. Performed at [facility], [place] [address], [place], [place] [address]   MRSA PCR Screening     Status: None   Collection Time: 10/12/18  9:05 PM   Specimen: Nasal Mucosa; Nasopharyngeal  Result Value Ref Range Status   MRSA by PCR NEGATIVE NEGATIVE Final    Comment:        The GeneXpert MRSA Assay (FDA approved for NASAL specimens only), is one component of a comprehensive  MRSA colonization surveillance program. It is not intended to diagnose MRSA infection nor to guide or monitor treatment for MRSA infections. Performed at [facility], [place] [address] [place], [place] [address]      Radiology Studies: Dg Chest Port 1 View  Result Date: 10/17/2018 CLINICAL DATA:  Collapse of left lung, status post bronchoscopy EXAM: PORTABLE CHEST - 1 VIEW COMPARISON:  Earlier film of the same day FINDINGS: No pneumothorax. Significantly improved aeration of the previously opacified left hemithorax. There are mild interstitial and alveolar opacities in the left upper lung and residual patchy areas of consolidation/atelectasis around the left hilum and in the retrocardiac region. On the right, stable lower lung interstitial and alveolar opacities. Endotracheal tube, gastric tube, and left central venous catheter stable. Heart size and mediastinal contours are within normal limits. No definite effusion. Visualized bones  unremarkable. IMPRESSION: 1. Improved left lung aeration with residual airspace opacities as above. 2. No pneumothorax. 3.  Support hardware stable in position. Electronically Signed   By: Lucrezia Europe M.D.   On: 10/17/2018 11:40   Dg Chest Port 1 View  Result Date: 10/17/2018 CLINICAL DATA:  Intubation.  Encounter for central line care. EXAM: PORTABLE CHEST 1 VIEW COMPARISON:  10/14/2018 FINDINGS: There is new complete opacification of the left hemithorax. Right lung demonstrates interstitial and mild hazy opacities. New left internal jugular central venous line has its tip in the mid to lower superior vena cava. No pneumothorax. Endotracheal tube and nasal/orogastric tube are stable in well positioned. IMPRESSION: 1. New left internal jugular central venous line tip projects in the mid to lower superior vena cava. No pneumothorax. 2. New complete opacification of left hemithorax. This is consistent with diffuse lung consolidation and/or atelectasis with a probable pleural fluid component. Electronically Signed   By: Lajean Manes M.D.   On: 10/17/2018 10:20   Vas [place] Lower Extremity Venous (dvt)  Result Date: 10/17/2018  Lower Venous Study Indications: + PE and COVID -19.  Comparison Study: No priors Performing Technologist: Velva Harman Sturdivant RDMS, RVT  Examination Guidelines: A complete evaluation includes B-mode imaging, spectral Doppler, color Doppler, and power Doppler as needed of all accessible portions of each vessel. Bilateral testing is considered an integral part of a complete examination. Limited examinations for reoccurring indications may be performed as noted.  +---------+---------------+---------+-----------+----------+-----------------+ RIGHT    CompressibilityPhasicitySpontaneityPropertiesThrombus Aging    +---------+---------------+---------+-----------+----------+-----------------+ CFV      Full           Yes      Yes                                     +---------+---------------+---------+-----------+----------+-----------------+ SFJ      Full                                                           +---------+---------------+---------+-----------+----------+-----------------+ FV Prox  Full                                                           +---------+---------------+---------+-----------+----------+-----------------+  FV Mid   Full                                                           +---------+---------------+---------+-----------+----------+-----------------+ FV DistalFull                                                           +---------+---------------+---------+-----------+----------+-----------------+ PFV      Full                                                           +---------+---------------+---------+-----------+----------+-----------------+ POP      Partial        Yes      Yes                  Age Indeterminate +---------+---------------+---------+-----------+----------+-----------------+ PTV      Partial                                      Acute             +---------+---------------+---------+-----------+----------+-----------------+ PERO     None                                         Acute             +---------+---------------+---------+-----------+----------+-----------------+   +---------+---------------+---------+-----------+----------+--------------+ LEFT     CompressibilityPhasicitySpontaneityPropertiesThrombus Aging +---------+---------------+---------+-----------+----------+--------------+ CFV      Full           Yes      Yes                                 +---------+---------------+---------+-----------+----------+--------------+ SFJ      Full                                                        +---------+---------------+---------+-----------+----------+--------------+ FV Prox  Full                                                         +---------+---------------+---------+-----------+----------+--------------+ FV Mid   Full                                                        +---------+---------------+---------+-----------+----------+--------------+  FV DistalFull                                                        +---------+---------------+---------+-----------+----------+--------------+ PFV      Full                                                        +---------+---------------+---------+-----------+----------+--------------+ POP      Full                                                        +---------+---------------+---------+-----------+----------+--------------+ PTV      Partial                                      Acute          +---------+---------------+---------+-----------+----------+--------------+ PERO     Partial                                      Acute          +---------+---------------+---------+-----------+----------+--------------+     Summary: Right: Findings consistent with age indeterminate deep vein thrombosis involving the right popliteal vein, right posterior tibial veins, and right peroneal veins. Left: Findings consistent with age indeterminate deep vein thrombosis involving the left posterior tibial veins, and left peroneal veins.  *See table(s) above for measurements and observations. Electronically signed by Ruta Hinds MD on 10/17/2018 at 11:06:03 AM.    Final     The patient is critically ill with multiple organ system failure and requires high complexity decision making for assessment and support, frequent evaluation and titration of therapies, advanced monitoring, review of radiographic studies and interpretation of complex data.    Critical Care Time devoted to patient care services, exclusive of separately billable procedures, described in this note is 35 minutes.    Marzetta Board, MD, PhD [facility]  Contact via  [email or web address]   [place] P: [phone] F: [phone]   Family communication: [facility]) [phone] Uel Davidow (Brother) [phone] Nevin Grizzle (Sister) [phone] Dwana Curd [facility] ([place] [phone] (English fluent)

## 2018-10-17 NOTE — Progress Notes (Signed)
[facility] Progress Note  Subjective: creat and BUN up further, good UOP, no pressors, CVP 10  Vitals:   10/17/18 1513 10/17/18 1600 10/17/18 1700 10/17/18 1800  BP: 140/74 125/79  114/67  Pulse: 87 81 81 79  Resp: (!) 34 (!) 25 (!) 34 (!) 34  Temp:  (!) 100.9 F (38.3 C) (!) 100.8 F (38.2 C) (!) 100.6 F (38.1 C)  TempSrc:      SpO2: 100% 95% 99% 99%  Weight:      Height:        Inpatient medications: . artificial tears  1 application Both Eyes E3O  . chlorhexidine gluconate (MEDLINE KIT)  15 mL Mouth Rinse BID  . Chlorhexidine Gluconate Cloth  6 each Topical Daily  . dexamethasone (DECADRON) injection  6 mg Intravenous Q12H  . famotidine  20 mg Oral Daily  . feeding supplement (PRO-STAT SUGAR FREE 64)  60 mL Per Tube BID  . feeding supplement (VITAL HIGH PROTEIN)  1,000 mL Per Tube Q24H  . fentaNYL (SUBLIMAZE) injection  50 mcg Intravenous Once  . insulin aspart  0-20 Units Subcutaneous Q4H  . insulin detemir  10 Units Subcutaneous BID  . mouth rinse  15 mL Mouth Rinse 10 times per day  . multivitamin with minerals  1 tablet Per Tube Daily  . polyethylene glycol  17 g Per Tube BID  . sennosides  5 mL Per Tube BID  . sodium zirconium cyclosilicate  10 g Oral Once   . sodium chloride Stopped (10/15/18 0343)  . fentaNYL infusion INTRAVENOUS 200 mcg/hr (10/17/18 1727)  . heparin 800 Units/hr (10/17/18 1747)  . lactated ringers 75 mL/hr at 10/17/18 1658  . midazolam 4 mg/hr (10/17/18 1624)  . norepinephrine (LEVOPHED) Adult infusion Stopped (10/16/18 0111)  . vecuronium (NORCURON) infusion 0.7 mcg/kg/min (10/17/18 1725)   sodium chloride, acetaminophen, fentaNYL, midazolam, oxyCODONE, vecuronium    Exam:  pt seen in ICU  on vent, sedated, not responding   No jvd  Chest occ rhonchi, no rales, or wheezing    Home meds:  - glipizide 5 qam/ metformin 1gm bid  - tamsulosin 0.4 qd  - lovastatin 20 hs  - prn ibuprofen tid    I/O 9/14 - 10/16/18  >>   17 L in and 6 L out = +11 L   Wt's = admit 93, peak 99kg, today 99kg    Na 145 K 5.3  CO2 29  BUN 86  Cr 3.80   eGFR 16- 19  Ca 7.2  Alb 1.8  AST 56/ ALT 69  Tbili 0.3    WBC 11k  Hb 11.7  plt 193  Ddimer 18.02      CXR 9/14 - mult bilateral infiltrates    CXR 9/17 - larger lung volumes and improved bilateral ventilation with decreased patchy and indistinct bilateral pulmonary opacity    cXR 9/20 - Improved left lung aeration with residual airspace opacities    UA 9/20- neg protein, 21-50 wbc/ 0-5 rbc, rare bact   UNa 73, UCr 68  Assessment/ Plan: 1. Acute renal failure - subsequent to cardiac arrest and post arrest hypotension/ shock that was relatively short-lived.  Was on pressors for about 24 hrs and has been off for last 48 hrs. Creat continues to rise, however UOP is good. No indication for RRT at this time.  [place] dirty, urine lytes suggestive of ATN.  May need RRT soon.  Mild-mod edema of extremiteis but CVP 10 so would not diurese.  2. COVID -19 PNA / ARDS/ acute hypoxic resp failure- on vent, 80% FiO2 3. Vol excess - as above 4. DVT - of bilat LE's, on IV heparin 5. Cardiac arrest - on 9/17, 9 min to ROSC 6. DM - on SSI       Thomas Gill 10/17/2018, 6:27 PM  Iron/TIBC/Ferritin/ %Sat    Component Value Date/Time   FERRITIN 172 10/14/2018 0435   Recent Labs  Lab 10/17/18 0350  NA 143  K 5.6*  CL 104  CO2 29  GLUCOSE 267*  BUN 108*  CREATININE 4.40*  CALCIUM 7.9*  PHOS 4.6  ALBUMIN 1.9*   Recent Labs  Lab 10/17/18 0350  AST 38  ALT 59*  ALKPHOS 100  BILITOT 0.2*  PROT 5.7*   Recent Labs  Lab 10/17/18 0350  WBC 13.4*  HGB 11.9*  HCT 38.6*  PLT 232

## 2018-10-17 NOTE — Progress Notes (Signed)
Using [place] interpreters at [phone], interpreter [phone] I was able to get in touch with the patient's sister Andree Moro [phone]) who currently lives in [place], and was able to give updates and answer all their questions today 10/17/2018  Genavieve Mangiapane M. Cruzita Lederer, MD, PhD [facility]  Contact via  [email or web address]  [place] P: [phone] F: [phone]

## 2018-10-17 NOTE — Procedures (Signed)
Bronchoscopy Procedure Note Alyxander Kollmann [id number] 12/22/1955  Procedure: Bronchoscopy Indications: Diagnostic evaluation of the airways, Obtain specimens for culture and/or other diagnostic studies and Remove secretions  Procedure Details Consent: Unable to obtain consent because of emergent medical necessity. Time Out: Verified patient identification, verified procedure, site/side was marked, verified correct patient position, special equipment/implants available, medications/allergies/relevent history reviewed, required imaging and test results available.  Performed  In preparation for procedure, patient was given 100% FiO2 and bronchoscope lubricated. Sedation: Benzodiazepines and Muscle relaxants  Airway entered and the following bronchi were examined: RUL, RML, RLL, LUL, LLL and Bronchi. Left upper lobe and lower lobes were completely occluded by mucous plugs.  This was suctioned off and cleared with aliquots of saline.  Mucus was blood-tinged but no overt, active bleeding identified. Airway inspection at the end of procedure show showed open bronchial tree bilaterally.    Procedures performed: BAL performed in the left lower lobe.  60 cc x 2 aliquots with return of 80 cc serosanguineous fluid with mucous plugs.  Bronchoscope removed. Patient placed back on 100% FiO2 at conclusion of procedure.    Evaluation Hemodynamic Status: BP stable throughout; O2 sats: stable throughout Patient's Current Condition: stable Specimens:  Sent serosanguinous fluid Complications: No apparent complications Patient did tolerate procedure well.   Daniqua Campoy 10/17/2018

## 2018-10-17 NOTE — Procedures (Signed)
Central Venous Catheter Insertion Procedure Note Jacquise Rarick [id number] [birth year]  Procedure: Insertion of Central Venous Catheter Indications: Assessment of intravascular volume and Drug and/or fluid administration  Procedure Details Consent: Risks of procedure as well as the alternatives and risks of each were explained to the (patient/caregiver).  Consent for procedure obtained. Time Out: Verified patient identification, verified procedure, site/side was marked, verified correct patient position, special equipment/implants available, medications/allergies/relevent history reviewed, required imaging and test results available.  Performed  Maximum sterile technique was used including antiseptics, cap, gloves, gown, hand hygiene, mask and sheet. Skin prep: Chlorhexidine; local anesthetic administered A antimicrobial bonded/coated triple lumen catheter was placed in the left internal jugular vein using the Seldinger technique.  Evaluation Blood flow good Complications: No apparent complications Patient did tolerate procedure well. Chest X-ray ordered to verify placement.  CXR: pending.  Mikella Linsley 10/17/2018, 10:07 AM

## 2018-10-17 NOTE — Progress Notes (Signed)
Spoke with patient daughter via phone with interpreter line. Updated to condition and treatments. Questions answered

## 2018-10-18 ENCOUNTER — Inpatient Hospital Stay (HOSPITAL_COMMUNITY): Payer: Medicaid Other

## 2018-10-18 DIAGNOSIS — J9601 Acute respiratory failure with hypoxia: Secondary | ICD-10-CM | POA: Diagnosis not present

## 2018-10-18 DIAGNOSIS — U071 COVID-19: Secondary | ICD-10-CM | POA: Diagnosis not present

## 2018-10-18 DIAGNOSIS — N179 Acute kidney failure, unspecified: Secondary | ICD-10-CM | POA: Diagnosis not present

## 2018-10-18 DIAGNOSIS — N170 Acute kidney failure with tubular necrosis: Secondary | ICD-10-CM

## 2018-10-18 LAB — POCT I-STAT 7, (LYTES, BLD GAS, ICA,H+H)
Acid-Base Excess: 1 mmol/L (ref 0.0–2.0)
Acid-Base Excess: 2 mmol/L (ref 0.0–2.0)
Bicarbonate: 28.6 mmol/L — ABNORMAL HIGH (ref 20.0–28.0)
Bicarbonate: 29.4 mmol/L — ABNORMAL HIGH (ref 20.0–28.0)
Calcium, Ion: 1.18 mmol/L (ref 1.15–1.40)
Calcium, Ion: 1.2 mmol/L (ref 1.15–1.40)
HCT: 34 % — ABNORMAL LOW (ref 39.0–52.0)
HCT: 33 % — ABNORMAL LOW (ref 39.0–52.0)
Hemoglobin: 11.6 g/dL — ABNORMAL LOW (ref 13.0–17.0)
Hemoglobin: 11.2 g/dL — ABNORMAL LOW (ref 13.0–17.0)
O2 Saturation: 78 %
O2 Saturation: 89 %
Patient temperature: 37.5
Patient temperature: 37.5
Potassium: 5.8 mmol/L — ABNORMAL HIGH (ref 3.5–5.1)
Potassium: 5.8 mmol/L — ABNORMAL HIGH (ref 3.5–5.1)
Sodium: 143 mmol/L (ref 135–145)
Sodium: 145 mmol/L (ref 135–145)
TCO2: 30 mmol/L (ref 22–32)
TCO2: 31 mmol/L (ref 22–32)
pCO2 arterial: 58.9 mmHg — ABNORMAL HIGH (ref 32.0–48.0)
pCO2 arterial: 63.6 mmHg — ABNORMAL HIGH (ref 32.0–48.0)
pH, Arterial: 7.296 — ABNORMAL LOW (ref 7.350–7.450)
pH, Arterial: 7.276 — ABNORMAL LOW (ref 7.350–7.450)
pO2, Arterial: 49 mmHg — ABNORMAL LOW (ref 83.0–108.0)
pO2, Arterial: 69 mmHg — ABNORMAL LOW (ref 83.0–108.0)

## 2018-10-18 LAB — COMPREHENSIVE METABOLIC PANEL
ALT: 47 U/L — ABNORMAL HIGH (ref 0–44)
AST: 43 U/L — ABNORMAL HIGH (ref 15–41)
Albumin: 1.7 g/dL — ABNORMAL LOW (ref 3.5–5.0)
Alkaline Phosphatase: 92 U/L (ref 38–126)
Anion gap: 9 (ref 5–15)
BUN: 144 mg/dL — ABNORMAL HIGH (ref 6–20)
CO2: 29 mmol/L (ref 22–32)
Calcium: 7.8 mg/dL — ABNORMAL LOW (ref 8.9–10.3)
Chloride: 106 mmol/L (ref 98–111)
Creatinine, Ser: 4.58 mg/dL — ABNORMAL HIGH (ref 0.61–1.24)
GFR calc Af Amer: 15 mL/min — ABNORMAL LOW (ref >60)
GFR calc non Af Amer: 13 mL/min — ABNORMAL LOW (ref >60)
Glucose, Bld: 365 mg/dL — ABNORMAL HIGH (ref 70–99)
Potassium: 6 mmol/L — ABNORMAL HIGH (ref 3.5–5.1)
Sodium: 144 mmol/L (ref 135–145)
Total Bilirubin: 0.5 mg/dL (ref 0.3–1.2)
Total Protein: 5.8 g/dL — ABNORMAL LOW (ref 6.5–8.1)

## 2018-10-18 LAB — RENAL FUNCTION PANEL
Albumin: 1.7 g/dL — ABNORMAL LOW (ref 3.5–5.0)
Anion gap: 8 (ref 5–15)
BUN: 136 mg/dL — ABNORMAL HIGH (ref 6–20)
CO2: 26 mmol/L (ref 22–32)
Calcium: 7.7 mg/dL — ABNORMAL LOW (ref 8.9–10.3)
Chloride: 110 mmol/L (ref 98–111)
Creatinine, Ser: 4.45 mg/dL — ABNORMAL HIGH (ref 0.61–1.24)
GFR calc Af Amer: 16 mL/min — ABNORMAL LOW (ref >60)
GFR calc non Af Amer: 13 mL/min — ABNORMAL LOW (ref >60)
Glucose, Bld: 304 mg/dL — ABNORMAL HIGH (ref 70–99)
Phosphorus: 3.9 mg/dL (ref 2.5–4.6)
Potassium: 5.7 mmol/L — ABNORMAL HIGH (ref 3.5–5.1)
Sodium: 144 mmol/L (ref 135–145)

## 2018-10-18 LAB — CBC
HCT: 36.8 % — ABNORMAL LOW (ref 39.0–52.0)
Hemoglobin: 11.1 g/dL — ABNORMAL LOW (ref 13.0–17.0)
MCH: 30.2 pg (ref 26.0–34.0)
MCHC: 30.2 g/dL (ref 30.0–36.0)
MCV: 100 fL (ref 80.0–100.0)
Platelets: 171 10*3/uL (ref 150–400)
RBC: 3.68 MIL/uL — ABNORMAL LOW (ref 4.22–5.81)
RDW: 14.6 % (ref 11.5–15.5)
WBC: 9.3 10*3/uL (ref 4.0–10.5)
nRBC: 1 % — ABNORMAL HIGH (ref 0.0–0.2)

## 2018-10-18 LAB — HEPARIN LEVEL (UNFRACTIONATED)
Heparin Unfractionated: <0.1 IU/mL — ABNORMAL LOW (ref 0.30–0.70)
Heparin Unfractionated: 0.29 IU/mL — ABNORMAL LOW (ref 0.30–0.70)

## 2018-10-18 LAB — MAGNESIUM: Magnesium: 2.5 mg/dL — ABNORMAL HIGH (ref 1.7–2.4)

## 2018-10-18 LAB — GLUCOSE, CAPILLARY
Glucose-Capillary: 320 mg/dL — ABNORMAL HIGH (ref 70–99)
Glucose-Capillary: 333 mg/dL — ABNORMAL HIGH (ref 70–99)
Glucose-Capillary: 309 mg/dL — ABNORMAL HIGH (ref 70–99)
Glucose-Capillary: 295 mg/dL — ABNORMAL HIGH (ref 70–99)
Glucose-Capillary: 277 mg/dL — ABNORMAL HIGH (ref 70–99)
Glucose-Capillary: 141 mg/dL — ABNORMAL HIGH (ref 70–99)

## 2018-10-18 LAB — PHOSPHORUS: Phosphorus: 3.7 mg/dL (ref 2.5–4.6)

## 2018-10-18 LAB — MRSA PCR SCREENING: MRSA by PCR: NEGATIVE

## 2018-10-18 LAB — C-REACTIVE PROTEIN: CRP: 31.1 mg/dL — ABNORMAL HIGH (ref <1.0)

## 2018-10-18 MED ORDER — SODIUM CHLORIDE 0.9 % IV SOLN
500.0000 [IU]/h | INTRAVENOUS | Status: DC
  Administered 2018-10-18: 17:00:00 500 [IU]/h via INTRAVENOUS_CENTRAL
  Filled 2018-10-18: qty 2
  Filled 2018-10-18: qty 10000

## 2018-10-18 MED ORDER — SODIUM CHLORIDE 0.9 % IV SOLN: 250.0000 [IU]/h | INTRAVENOUS | Status: DC

## 2018-10-18 MED ORDER — INSULIN DETEMIR 100 UNIT/ML ~~LOC~~ SOLN
20.0000 [IU] | Freq: Two times a day (BID) | SUBCUTANEOUS | Status: DC
  Administered 2018-10-18 – 2018-10-20 (×4): 20 [IU] via SUBCUTANEOUS
  Filled 2018-10-18 (×5): qty 0.2

## 2018-10-18 MED ORDER — HEPARIN BOLUS VIA INFUSION (CRRT): 1000.0000 [IU] | INTRAVENOUS | Status: DC | PRN

## 2018-10-18 MED ORDER — SODIUM CHLORIDE 0.9 % IV SOLN
0.0000 ug/kg/min | INTRAVENOUS | Status: DC
  Administered 2018-10-18: 3 ug/kg/min via INTRAVENOUS
  Administered 2018-10-19: 04:00:00 2 ug/kg/min via INTRAVENOUS
  Filled 2018-10-18 (×2): qty 20

## 2018-10-18 MED ORDER — SODIUM CHLORIDE 0.9 % IV SOLN
2.0000 g | INTRAVENOUS | Status: DC
  Administered 2018-10-18: 13:00:00 2 g via INTRAVENOUS
  Filled 2018-10-18: qty 2

## 2018-10-18 MED ORDER — VANCOMYCIN HCL 10 G IV SOLR
2000.0000 mg | Freq: Once | INTRAVENOUS | Status: AC
  Administered 2018-10-18: 14:00:00 2000 mg via INTRAVENOUS
  Filled 2018-10-18: qty 2000

## 2018-10-18 MED ORDER — SODIUM ZIRCONIUM CYCLOSILICATE 10 G PO PACK
10.0000 g | PACK | ORAL | Status: AC
  Administered 2018-10-18 (×2): 10 g via ORAL
  Filled 2018-10-18 (×2): qty 1

## 2018-10-18 MED ORDER — FAMOTIDINE 40 MG/5ML PO SUSR
20.0000 mg | Freq: Two times a day (BID) | ORAL | Status: DC
  Administered 2018-10-18: 20 mg
  Filled 2018-10-18 (×2): qty 2.5

## 2018-10-18 MED ORDER — INSULIN ASPART 100 UNIT/ML ~~LOC~~ SOLN
3.0000 [IU] | SUBCUTANEOUS | Status: DC
  Administered 2018-10-18 – 2018-10-21 (×19): 3 [IU] via SUBCUTANEOUS

## 2018-10-18 MED ORDER — PRISMASOL BGK 4/2.5 32-4-2.5 MEQ/L IV SOLN
INTRAVENOUS | Status: DC
  Administered 2018-10-18 – 2018-10-24 (×33): via INTRAVENOUS_CENTRAL

## 2018-10-18 MED ORDER — HEPARIN (PORCINE) 25000 UT/250ML-% IV SOLN
950.0000 [IU]/h | INTRAVENOUS | Status: DC
  Administered 2018-10-18 – 2018-10-19 (×2): 950 [IU]/h via INTRAVENOUS
  Filled 2018-10-18: qty 250

## 2018-10-18 MED ORDER — PRISMASOL BGK 4/2.5 32-4-2.5 MEQ/L REPLACEMENT SOLN
Status: DC
  Administered 2018-10-18 – 2018-10-19 (×2): via INTRAVENOUS_CENTRAL

## 2018-10-18 MED ORDER — HEPARIN SODIUM (PORCINE) 1000 UNIT/ML DIALYSIS
1000.0000 [IU] | INTRAMUSCULAR | Status: DC | PRN
  Administered 2018-10-18: 2000 [IU] via INTRAVENOUS_CENTRAL
  Administered 2018-10-22: 2400 [IU] via INTRAVENOUS_CENTRAL
  Filled 2018-10-18: qty 2
  Filled 2018-10-18 (×3): qty 6

## 2018-10-18 MED ORDER — PRISMASOL BGK 0/2.5 32-2.5 MEQ/L REPLACEMENT SOLN
Status: DC
  Administered 2018-10-18 – 2018-10-23 (×9): via INTRAVENOUS_CENTRAL
  Filled 2018-10-18 (×12): qty 5000

## 2018-10-18 MED ORDER — SODIUM CHLORIDE 0.9 % IV SOLN
2.0000 g | Freq: Two times a day (BID) | INTRAVENOUS | Status: DC
  Administered 2018-10-19 – 2018-10-22 (×8): 2 g via INTRAVENOUS
  Filled 2018-10-18 (×8): qty 2

## 2018-10-18 MED ORDER — INSULIN DETEMIR 100 UNIT/ML ~~LOC~~ SOLN
15.0000 [IU] | Freq: Two times a day (BID) | SUBCUTANEOUS | Status: DC
  Administered 2018-10-18: 10:00:00 15 [IU] via SUBCUTANEOUS
  Filled 2018-10-18 (×2): qty 0.15

## 2018-10-18 MED ORDER — VANCOMYCIN VARIABLE DOSE PER UNSTABLE RENAL FUNCTION (PHARMACIST DOSING): Status: DC

## 2018-10-18 NOTE — Progress Notes (Signed)
ABG results given to Dr. Vaughan Browner and RN. Verbal order received to prone pt after CVVH started.

## 2018-10-18 NOTE — TOC Initial Note (Signed)
Transition of Care [facility] - Initial/Assessment Note    Patient Details  Name: Thomas Gill MRN: [id number] Date of Birth: 1955/03/20  Transition of [facility]) CM/SW Contact:    Carles Collet, RN Phone Number: 10/18/2018, 1:05 PM  Clinical Narrative:                Critically ill patient being treat for COVID, nonoliguric AKI after cardiac arrest, DVT. Physicians communicating with family daily. CM will continue to follow peripherally, please consult when we can assist with transitional or discharge planning.          Patient Goals and CMS Choice        Expected Discharge Plan and Services                                                Prior Living Arrangements/Services                       Activities of Daily Living Home Assistive Devices/Equipment: None ADL Screening (condition at time of admission) Patient's cognitive ability adequate to safely complete daily activities?: Yes Is the patient deaf or have difficulty hearing?: No Does the patient have difficulty seeing, even when wearing glasses/contacts?: No Does the patient have difficulty concentrating, remembering, or making decisions?: No Patient able to express need for assistance with ADLs?: No Does the patient have difficulty dressing or bathing?: No Independently performs ADLs?: Yes (appropriate for developmental age) Does the patient have difficulty walking or climbing stairs?: No Weakness of Legs: None Weakness of Arms/Hands: None  Permission Sought/Granted                  Emotional Assessment              Admission diagnosis:  Acute respiratory failure with hypoxia ([place]) [J96.01] AKI (acute kidney injury) ([place]) [N17.9] COVID-19 virus infection [U07.1] Patient Active Problem List   Diagnosis Date Noted  . Collapse of left lung   . AKI (acute kidney injury) ([place])   . Acute respiratory failure with hypoxia ([place]) 10/12/2018  . COVID-19 virus infection 10/24/2018  .  HYPERCHOLESTEROLEMIA 12/27/2007  . ALLERGIC RHINITIS 12/22/2007  . ELEVATED BLOOD PRESSURE 12/22/2007   PCP:  Patient, No Pcp Per Pharmacy:   [place], [address] [place]. [place] [address] Phone: [phone] Fax: [phone]  [place], [address] [place] [address] Phone: [phone] Fax: [phone]     Social Determinants of Health (SDOH) Interventions    Readmission Risk Interventions No flowsheet data found.

## 2018-10-18 NOTE — Progress Notes (Signed)
150 ml of vecuronium wasted. Witnessed by Aquilla Solian RN.

## 2018-10-18 NOTE — Procedures (Signed)
Hemodialysis Catheter Insertion Procedure Note Colman Birdwell [id number] 1955/03/13  Procedure: Insertion of Hemodialysis Catheter Indications: Dialysis Access   Procedure Details Consent: Unable to obtain consent because of altered level of consciousness. Time Out: Verified patient identification, verified procedure, site/side was marked, verified correct patient position, special equipment/implants available, medications/allergies/relevent history reviewed, required imaging and test results available.  Performed  Maximum sterile technique was used including antiseptics, cap, gloves, gown, hand hygiene, mask and sheet. Skin prep: Chlorhexidine; local anesthetic administered Triple lumen hemodialysis catheter was inserted into left internal jugular vein using the Seldinger technique.  Evaluation Blood flow good Complications: No apparent complications Patient did tolerate procedure well. Chest X-ray ordered to verify placement.  CXR: normal.   Esiah Bazinet 10/18/2018

## 2018-10-18 NOTE — Progress Notes (Signed)
ABG results given to Dr. Vaughan Browner. Verbal order received to increase to 100% FiO2 and repeat ABG.

## 2018-10-18 NOTE — Progress Notes (Signed)
Pt's head turned at this time to the right without complications. 

## 2018-10-18 NOTE — Progress Notes (Signed)
Pt ETT holister removed and ETT secured with cloth tape at 23cm at the right center of the lip. Pt then proned with his head facing the right. ETT remains at 23cm at the lip.

## 2018-10-18 NOTE — Progress Notes (Signed)
Prior to proning pt ETT suctioned with copious, thick amounts of tan/pink tinged secretions. His mouth was also suctioned with a moderate amount of thick brown secretions with a mild odor as well noted.

## 2018-10-18 NOTE — Procedures (Signed)
Arterial Catheter Insertion Procedure Note Thomas Gill [id number] 03-17-1955  Procedure: Insertion of Arterial Catheter  Indications: Blood pressure monitoring and Frequent blood sampling  Procedure Details Consent: Unable to obtain consent because of altered level of consciousness. Time Out: Verified patient identification, verified procedure, site/side was marked, verified correct patient position, special equipment/implants available, medications/allergies/relevent history reviewed, required imaging and test results available.  Performed  Maximum sterile technique was used including antiseptics, cap, gloves, gown, hand hygiene, mask and sheet. Skin prep: Chlorhexidine; local anesthetic administered 20 gauge catheter was inserted into right femoral artery using the Seldinger technique. ULTRASOUND GUIDANCE USED: YES Evaluation Blood flow good; BP tracing good. Complications: No apparent complications.  Marshell Garfinkel MD [facility] 10/18/2018, 1:10 PM

## 2018-10-18 NOTE — Progress Notes (Signed)
When arrived for my shift, pt's temp via esophageal, was 96.8 w/a HR of 73... applied Retail banker around 2000

## 2018-10-18 NOTE — Progress Notes (Signed)
Called pt's niece. Updated of pt condition and latest events and plan of care. Pt's niece appreciative of update.

## 2018-10-18 NOTE — Progress Notes (Signed)
Patient remains proned. Patients head repositioned. ETT secured 23cm@lip . No complications.

## 2018-10-18 NOTE — Progress Notes (Signed)
Pharmacy Antibiotic Note  Thomas Gill is a 63 y.o. male admitted on 01-Nov-2018 with sepsis 2/2 HCAP. WBC  9.3 (steroids/shock), febrile (Tmax 101.8), lactate acid elevated at 3.4, CRP elevated to 31.1. Patient was started on CRRT on 9/21.  Pharmacy has been consulted for vancomycin and cefepime dosing.  Plan: Start Cefepime 2gm q12h  Give Vancomycin 2 gm IV load x1 dose; then 24 hours after starting CRRT start Vancomycin 1 gm IV  q24hr.    - Obtain levels as indicated   - Monitor daily CRRT plan and clinical improvement   Height: 5\' 3"  (160 cm) Weight: 224 lb 13.9 oz (102 kg) IBW/kg (Calculated) : 56.9  Temp (24hrs), Avg:100.2 F (37.9 C), Min:98.6 F (37 C), Max:101.8 F (38.8 C)  Recent Labs  Lab 2018/11/01 1135  November 01, 2018 1420  10/14/18 2325 10/15/18 0440 10/16/18 0445 10/17/18 0350 10/18/18 0443  WBC  --    < >  --    < > 19.7* 20.7* 11.1* 13.4* 9.3  CREATININE  --    < >  --    < > 2.26* 2.72* 3.80* 4.40* 4.58*  LATICACIDVEN 2.8*  --  2.1*  --   --  3.4*  --   --   --    < > = values in this interval not displayed.    Estimated Creatinine Clearance: 18.2 mL/min (A) (by C-G formula based on SCr of 4.58 mg/dL (H)).    No Known Allergies  Antimicrobials this admission: 9/14 Remdesivir >> 9/18 9/21 Vancomycin >> 9/21 Cefepime >>  Microbiology results: 9/20 Bronchial: Proteus Mirabilis (Susceptibilities pending)  9/15 MRSA PCR: Neg; 9/21:  9/14 Covid: positive   Thank you for allowing pharmacy to be a part of this patient's care.  Acey Lav, PharmD  PGY1 Acute Care Pharmacy Resident 10/18/2018 10:23 AM

## 2018-10-18 NOTE — Progress Notes (Signed)
NAMETaino Gill, MRN:  [id number], DOB:  [birth year], LOS: 7 ADMISSION DATE:  10/15/2018, CONSULTATION DATE:  9/15 REFERRING MD:  Delane Ginger, CHIEF COMPLAINT:  dyspnea   Brief History   63 y/o male with obesity, DM2 admitted with COVID Pneumonia.   Past Medical History  Former smoker DM2  [facility] Events   9/14 admission 9/17 intubated, proned.  Cardiac arrest later in day while he was in prone position.  Central line placed.  Started on Levophed drip 9/18- Paralyzed for vent dysynchrony 9/19- Started heparin for DVT 9/20-bronchoscopy for mucous plugging, left hemithorax opacification 9/21-starting CVVH, restart proning  Consults:  PCCM  Procedures:  ETT 9/17>>> Femoral CVL 9/17 >> 9/20 Lt IJ CVL 9/20 >> Rt fem a line 9/21 >> Rt IL HD cath 9/21 >>  Significant Diagnostic Tests:  Lower extremity duplex 9/19> age indeterminate bilateral DVT  Micro Data:  9/14 SARS COV 2 positive BAL 9/20 >> gram-negative rods, gram-positive cocci  Antimicrobials/COVID Rx  9/14 Decadron >  9/14 Remdesivir >  9/18  Vancomycin 9/21> Cefepime 9/21>  Interim history/subjective:  Remains on the ventilator requiring high PEEP/FiO2 Making urine but volume overloaded.  BUN, creatinine, potassium increasing.  Objective   Blood pressure (!) 154/67, pulse 67, temperature 98.8 F (37.1 C), resp. rate (!) 34, height _0  (1.6 m), weight 102 kg, SpO2 96 %. CVP:  [10 mmHg-15 mmHg] 15 mmHg  Vent Mode: PRVC FiO2 (%):  [60 %-100 %] 100 % Set Rate:  [34 bmp] 34 bmp Vt Set:  [390 mL] 390 mL PEEP:  [12 cmH20-14 cmH20] 12 cmH20 Plateau Pressure:  [25 cmH20-28 cmH20] 25 cmH20   Intake/Output Summary (Last 24 hours) at 10/18/2018 1259 Last data filed at 10/18/2018 1200 Gross per 24 hour  Intake 3969.87 ml  Output 2780 ml  Net 1189.87 ml   Filed Weights   10/16/18 0500 10/17/18 0450 10/18/18 0600  Weight: 99.1 kg 99.2 kg 102 kg   Examination: Blood pressure (!) 154/67, pulse 67,  temperature 98.8 F (37.1 C), resp. rate (!) 34, height _1  (1.6 m), weight 102 kg, SpO2 96 %. Gen:      No acute distress HEENT:  EOMI, sclera anicteric, ET tube Neck:     No masses; no thyromegaly Lungs:    Clear to auscultation bilaterally; normal respiratory effort CV:         Regular rate and rhythm; no murmurs Abd:      + bowel sounds; soft, non-tender; no palpable masses, no distension Ext:   2+ edema; adequate peripheral perfusion Skin:      Mild rash over anterior chest. Neuro: Sedated, paralyzed  Resolved Hospital Problem list     Assessment & Plan:  ARDS due to COVID 19 pneumonia Continue decadron Maintain on full vent support Resume proning as oxygenation remains very poor Change vecuronium to Nimbex as renal function is poor. Continue low tidal volume ventilation Target driving pressure of less than 15.  HCAP BAL with GNR and GPC's Start Vanco, cefepime.  Cardiac arrest, lactic acidosis Off pressors. Telemetry monitoring  Lower extremity DVT Continue heparin drip  Sedation: Fentanyl and versed drips to continue  Acute kidney injury, hyperkalemia Given Lokelma Starting CRRT  Transaminitis, shock liver Trend LFT  Hyperglycemia Increase Levemir dose, continue SSI  Best practice:  Diet: Tube feed Pain/Anxiety/Delirium protocol (if indicated): Fentanyl, Versed VAP protocol (if indicated): Ordered DVT prophylaxis: Heparin drip GI prophylaxis: Pepcid Glucose control:SSI, Levemir Mobility: Bedrest Code Status: full Family Communication:  Per TRH Disposition: remain in ICU  Labs   CBC: Recent Labs  Lab 10/14/18 2325  10/15/18 0440  10/16/18 0445 10/16/18 0515 10/17/18 0350 10/18/18 0443 10/18/18 0834 10/18/18 1209  WBC 19.7*  --  20.7*  --  11.1*  --  13.4* 9.3  --   --   NEUTROABS 12.7*  --   --   --   --   --   --   --   --   --   HGB 12.8*   < > 13.4   < > 11.7* 12.2* 11.9* 11.1* 11.6* 11.2*  HCT 45.6   < > 45.3   < > 38.3* 36.0*  38.6* 36.8* 34.0* 33.0*  MCV 109.4*  --  102.7*  --  100.5*  --  99.0 100.0  --   --   PLT 209  --  192  --  193  --  232 171  --   --    < > = values in this interval not displayed.    Basic Metabolic Panel: Recent Labs  Lab 10/14/18 2325  10/15/18 0440  10/15/18 1930 10/16/18 0445 10/16/18 0515 10/16/18 1650 10/17/18 0350 10/18/18 0443 10/18/18 0834 10/18/18 1209  NA 145   < > 141   < >  --  145 141  --  143 144 143 145  K 5.8*   < > 4.8   < >  --  5.3* 4.9  --  5.6* 6.0* 5.8* 5.8*  CL 98  --  99  --   --  104  --   --  104 106  --   --   CO2 34*  --  26  --   --  29  --   --  29 29  --   --   GLUCOSE 206*  --  334*  --   --  236*  --   --  267* 365*  --   --   BUN 34*  --  49*  --   --  86*  --   --  108* 144*  --   --   CREATININE 2.26*  --  2.72*  --   --  3.80*  --   --  4.40* 4.58*  --   --   CALCIUM 8.3*  --  7.1*  --   --  7.2*  --   --  7.9* 7.8*  --   --   MG 3.3*  --  2.5*  --  2.5* 2.5*  --  2.5* 2.5* 2.5*  --   --   PHOS  --    < > 8.7*  --  6.6* 5.2*  --  5.2* 4.6 3.7  --   --    < > = values in this interval not displayed.   GFR: Estimated Creatinine Clearance: 18.2 mL/min (A) (by C-G formula based on SCr of 4.58 mg/dL (H)). Recent Labs  Lab 10/04/2018 1420  10/15/18 0440 10/16/18 0445 10/17/18 0350 10/18/18 0443  WBC  --    < > 20.7* 11.1* 13.4* 9.3  LATICACIDVEN 2.1*  --  3.4*  --   --   --    < > = values in this interval not displayed.    Liver Function Tests: Recent Labs  Lab 10/14/18 0435 10/15/18 0440 10/16/18 0445 10/17/18 0350 10/18/18 0443  AST 60* 139* 56* 38 43*  ALT 64* 101* 69* 59* 47*  ALKPHOS 108 122 93  100 92  BILITOT 0.6 0.8 0.3 0.2* 0.5  PROT 6.5 5.7* 5.2* 5.7* 5.8*  ALBUMIN 2.5* 2.1* 1.8* 1.9* 1.7*   No results for input(s): LIPASE, AMYLASE in the last 168 hours. No results for input(s): AMMONIA in the last 168 hours.  ABG    Component Value Date/Time   PHART 7.276 (L) 10/18/2018 1209   PCO2ART 63.6 (H) 10/18/2018  1209   PO2ART 69.0 (L) 10/18/2018 1209   HCO3 29.4 (H) 10/18/2018 1209   TCO2 31 10/18/2018 1209   ACIDBASEDEF 1.0 10/15/2018 1550   O2SAT 89.0 10/18/2018 1209     Coagulation Profile: No results for input(s): INR, PROTIME in the last 168 hours.  Cardiac Enzymes: No results for input(s): CKTOTAL, CKMB, CKMBINDEX, TROPONINI in the last 168 hours.  HbA1C: Hgb A1c MFr Bld  Date/Time Value Ref Range Status  10/08/2018 02:30 PM 9.1 (H) 4.8 - 5.6 % Final    Comment:    (NOTE)         Prediabetes: 5.7 - 6.4         Diabetes: >6.4         Glycemic control for adults with diabetes: <7.0     CBG: Recent Labs  Lab 10/17/18 1610 10/17/18 1946 10/17/18 2346 10/18/18 0423 10/18/18 0821  GLUCAP 195* 197* 246* 320* 333*   The patient is critically ill with multiple organ system failure and requires high complexity decision making for assessment and support, frequent evaluation and titration of therapies, advanced monitoring, review of radiographic studies and interpretation of complex data.   Critical Care Time devoted to patient care services, exclusive of separately billable procedures, described in this note is 35 minutes.   Marshell Garfinkel MD [facility] Pager [phone] If no answer call [phone] 10/18/2018, 12:59 PM

## 2018-10-18 NOTE — Progress Notes (Signed)
Inpatient Diabetes Program Recommendations  AACE/ADA: New Consensus Statement on Inpatient Glycemic Control (2015)  Target Ranges:  Prepandial:   less than 140 mg/dL      Peak postprandial:   less than 180 mg/dL (1-2 hours)      Critically ill patients:  140 - 180 mg/dL   Results for Thomas Gill, Thomas Gill (MRN [id number]) as of 10/18/2018 07:44  Ref. Range 10/17/2018 00:02 10/17/2018 03:40 10/17/2018 07:49 10/17/2018 16:10 10/17/2018 19:46  Glucose-Capillary Latest Ref Range: 70 - 99 mg/dL 267 (H)  11 units NOVOLOG  251 (H)  11 units NOVOLOG  203 (H)  4 units NOVOLOG +  10 units LEVEMIR given at 12pm  195 (H)  4 units NOVOLOG  197 (H)  4 units NOVOLOG +  10 units LEVEMIR given at 10pm    Results for Thomas Gill, Thomas Gill (MRN [id number]) as of 10/18/2018 07:44  Ref. Range 10/17/2018 23:46 10/18/2018 04:23  Glucose-Capillary Latest Ref Range: 70 - 99 mg/dL 246 (H)  7 units NOVOLOG  320 (H)  15 units NOVOLOG      Home DM Meds: Glipizide 5 mg Daily       Metformin 1000 mg BID  Current Orders: Levemir 15 units BID     Novolog Resistant Correction Scale/ SSI (0-20 units) Q4 hours      Note patient remains of Vent.  Getting Decadron 6 mg Q12 hours.  Also getting Tube Feedings 40cc/hour.     MD- Note that Levemir increased to 15 units BID this AM (was 10 units BID).   May also consider adding scheduled Novolog Tube Feed Coverage to current inpatient regimen:  Novolog 4 units Q4 hours  HOLD if Tube Feeds HELD for any reason      --Will follow patient during hospitalization--  Wyn Quaker RN, MSN, CDE Diabetes Coordinator Inpatient Glycemic Control Team Team Pager: [phone] (8a-5p)

## 2018-10-18 NOTE — Progress Notes (Addendum)
[facility] NEPHROLOGY PROGRESS NOTE  Assessment/ Plan: Pt is a 63 y.o. yo male with a COVID-19 presented with shortness of breath, cough diarrhea.  He had cardiopulmonary arrest on 9/17 with ROSC 9 minutes.  He is intubated and now with worsening renal failure and hyperkalemia.  #Nonoliguric AKI likely ATN after cardiac arrest and COVID-19 infection.  Patient with hyperkalemia, creatinine level worsened to 4.58, BUN elevated to 144.  He is significantly volume up associated with high vent setting requirement.  Problem with oxygenation.  Plan to restart CRRT today.  I spoke with patient's sister [name] who is in [place].  I used [employer] interpreter # [phone] and discussed at length.  Verbal consent obtained for catheter placement and dialysis treatment.  We will use IV heparin for the anticoagulation.  UF goal 5200 cc an hour.  I have discussed with the primary team as well. -Order US renal.  #Hyperkalemia: Treated with Lokelma.  Starting CRRT.  Use 0K bath in post filter.  Prefilter and dialysate 4K bath.  #COVID-19 pneumonia/ARDS/vent dependent respiratory failure: Very high FiO2 with poor oxygenation.  Per PCCM and primary team.  #Cardiac arrest on 9/17: 9 minutes ROSC.  #DVT: IV heparin, per pulmonary.  #Elevated LFTs, shock liver.  Subjective: Chart and lab results reviewed.  Discussed with Dr. Gloris Ham.  Worsening renal failure and hyperkalemia.  Respiratory status worsening.  Patient is up by 9 kg fluid since admission. Objective Vital signs in last 24 hours: Vitals:   10/18/18 0800 10/18/18 0830 10/18/18 0900 10/18/18 1000  BP:  137/77 125/76 130/76  Pulse: 72 72 66 65  Resp: (!) 28 (!) 34 (!) 34   Temp: 99.5 F (37.5 C) 99.5 F (37.5 C) 99.5 F (37.5 C) 99.1 F (37.3 C)  TempSrc:      SpO2: 94% (!) 89% 97% 98%  Weight:      Height:       Weight change: 2.8 kg  Intake/Output Summary (Last 24 hours) at 10/18/2018 1044 Last data filed at 10/18/2018 1000 Gross  per 24 hour  Intake 3912.03 ml  Output 2510 ml  Net 1402.03 ml       Labs: Basic Metabolic Panel: Recent Labs  Lab 10/16/18 0445  10/16/18 1650 10/17/18 0350 10/18/18 0443 10/18/18 0834  NA 145   < >  --  143 144 143  K 5.3*   < >  --  5.6* 6.0* 5.8*  CL 104  --   --  104 106  --   CO2 29  --   --  29 29  --   GLUCOSE 236*  --   --  267* 365*  --   BUN 86*  --   --  108* 144*  --   CREATININE 3.80*  --   --  4.40* 4.58*  --   CALCIUM 7.2*  --   --  7.9* 7.8*  --   PHOS 5.2*  --  5.2* 4.6 3.7  --    < > = values in this interval not displayed.   Liver Function Tests: Recent Labs  Lab 10/16/18 0445 10/17/18 0350 10/18/18 0443  AST 56* 38 43*  ALT 69* 59* 47*  ALKPHOS 93 100 92  BILITOT 0.3 0.2* 0.5  PROT 5.2* 5.7* 5.8*  ALBUMIN 1.8* 1.9* 1.7*   No results for input(s): LIPASE, AMYLASE in the last 168 hours. No results for input(s): AMMONIA in the last 168 hours. CBC: Recent Labs  Lab 10/07/2018 1126  10/14/18 2325  10/15/18 0440  10/16/18 0445  10/17/18 0350 10/18/18 0443 10/18/18 0834  WBC 8.6   < > 19.7*  --  20.7*  --  11.1*  --  13.4* 9.3  --   NEUTROABS 8.3*  --  12.7*  --   --   --   --   --   --   --   --   HGB 14.9   < > 12.8*   < > 13.4   < > 11.7*   < > 11.9* 11.1* 11.6*  HCT 44.1   < > 45.6   < > 45.3   < > 38.3*   < > 38.6* 36.8* 34.0*  MCV 91.3   < > 109.4*  --  102.7*  --  100.5*  --  99.0 100.0  --   PLT 389   < > 209  --  192  --  193  --  232 171  --    < > = values in this interval not displayed.   Cardiac Enzymes: No results for input(s): CKTOTAL, CKMB, CKMBINDEX, TROPONINI in the last 168 hours. CBG: Recent Labs  Lab 10/17/18 1610 10/17/18 1946 10/17/18 2346 10/18/18 0423 10/18/18 0821  GLUCAP 195* 197* 246* 320* 333*    Iron Studies: No results for input(s): IRON, TIBC, TRANSFERRIN, FERRITIN in the last 72 hours. Studies/Results: Dg Chest Port 1 View  Result Date: 10/17/2018 CLINICAL DATA:  Collapse of left lung, status  post bronchoscopy EXAM: PORTABLE CHEST - 1 VIEW COMPARISON:  Earlier film of the same day FINDINGS: No pneumothorax. Significantly improved aeration of the previously opacified left hemithorax. There are mild interstitial and alveolar opacities in the left upper lung and residual patchy areas of consolidation/atelectasis around the left hilum and in the retrocardiac region. On the right, stable lower lung interstitial and alveolar opacities. Endotracheal tube, gastric tube, and left central venous catheter stable. Heart size and mediastinal contours are within normal limits. No definite effusion. Visualized bones unremarkable. IMPRESSION: 1. Improved left lung aeration with residual airspace opacities as above. 2. No pneumothorax. 3.  Support hardware stable in position. Electronically Signed   By: Lucrezia Europe M.D.   On: 10/17/2018 11:40   Dg Chest Port 1 View  Result Date: 10/17/2018 CLINICAL DATA:  Intubation.  Encounter for central line care. EXAM: PORTABLE CHEST 1 VIEW COMPARISON:  10/14/2018 FINDINGS: There is new complete opacification of the left hemithorax. Right lung demonstrates interstitial and mild hazy opacities. New left internal jugular central venous line has its tip in the mid to lower superior vena cava. No pneumothorax. Endotracheal tube and nasal/orogastric tube are stable in well positioned. IMPRESSION: 1. New left internal jugular central venous line tip projects in the mid to lower superior vena cava. No pneumothorax. 2. New complete opacification of left hemithorax. This is consistent with diffuse lung consolidation and/or atelectasis with a probable pleural fluid component. Electronically Signed   By: Lajean Manes M.D.   On: 10/17/2018 10:20    Medications: Infusions: . sodium chloride Stopped (10/15/18 0343)  . ceFEPime (MAXIPIME) IV    . fentaNYL infusion INTRAVENOUS 200 mcg/hr (10/18/18 1000)  . heparin 950 Units/hr (10/18/18 0536)  . midazolam 3 mg/hr (10/18/18 1000)  .  norepinephrine (LEVOPHED) Adult infusion Stopped (10/16/18 0111)  . vancomycin    . vecuronium (NORCURON) infusion 0.5 mcg/kg/min (10/18/18 1000)    Scheduled Medications: . artificial tears  1 application Both Eyes R1M  . chlorhexidine gluconate (MEDLINE KIT)  15 mL Mouth Rinse BID  . Chlorhexidine Gluconate Cloth  6 each Topical Daily  . dexamethasone (DECADRON) injection  6 mg Intravenous Q12H  . famotidine  20 mg Oral Daily  . feeding supplement (PRO-STAT SUGAR FREE 64)  60 mL Per Tube BID  . feeding supplement (VITAL HIGH PROTEIN)  1,000 mL Per Tube Q24H  . fentaNYL (SUBLIMAZE) injection  50 mcg Intravenous Once  . insulin aspart  0-20 Units Subcutaneous Q4H  . insulin detemir  15 Units Subcutaneous BID  . mouth rinse  15 mL Mouth Rinse 10 times per day  . multivitamin with minerals  1 tablet Per Tube Daily  . polyethylene glycol  17 g Per Tube BID  . sennosides  5 mL Per Tube BID  . sodium zirconium cyclosilicate  10 g Oral H4V    have reviewed scheduled and prn medications.  Cynthya Yam Prasad Alexah Kivett 10/18/2018,10:44 AM  LOS: 7 days  Pager: [phone]

## 2018-10-18 NOTE — Progress Notes (Signed)
Report received and assumed care. Pt noted to have redness to left side of chest that is warmer to touch compared to rest of body. Per night RN, redness spontaneously appeared this AM just prior to shift change. Dr. Vaughan Browner notified via secure messaging.

## 2018-10-18 NOTE — Progress Notes (Signed)
ANTICOAGULATION CONSULT NOTE - Follow Up Consult  Pharmacy Consult for Heparin Indication: pulmonary embolus  No Known Allergies  Patient Measurements: Height: 5\' 3"  (160 cm) Weight: 218 lb 11.1 oz (99.2 kg) IBW/kg (Calculated) : 56.9 Heparin Dosing Weight: 79 kg  Vital Signs: Temp: 99.9 F (37.7 C) (09/21 0419) BP: 124/68 (09/21 0430) Pulse Rate: 71 (09/21 0430)  Labs: Recent Labs    10/16/18 0445 10/16/18 0515 10/16/18 1310 10/17/18 0350 10/18/18 0200  HGB 11.7* 12.2*  --  11.9*  --   HCT 38.3* 36.0*  --  38.6*  --   PLT 193  --   --  232  --   HEPARINUNFRC 0.62  --  0.71*  --  <0.10*  CREATININE 3.80*  --   --  4.40*  --     Estimated Creatinine Clearance: 18.6 mL/min (A) (by C-G formula based on SCr of 4.4 mg/dL (H)).   Assessment: Thomas Gill is a 63 y.o. male presenting on 9/14 with worsening O2 saturation, cough, and diarrhea. Patient was diagnosed with covid last week. On 9/17, patient decompensated requiring intubation. Last night patient had a cardiorespiratory arrest/code blue event that lasted approximately 9 minutes until achieving ROSC.  Patient was not any PTA anticoagulation but was started on IV heparin for acute bilateral DVT's. Heparin was paused on 9/19 given oral bleeding which has resolved. Per MD, okay to resume IV heparin at 1800 on 9/20 . H/H low stable, Plt wnl   Today, 10/18/18  HL < 0.10, sub therapetuic  Hgb 11.9, plt 232, low but stable  Small amount of bleeding from nose per RN, but much less than what pt had been experiencing previously    Goal of Therapy:  Heparin level 0.3-0.5 units/ml Monitor platelets by anticoagulation protocol: Yes   Plan:   Increase heparin infusion to 950 units/hr. Will avoid boluses given bleeding   Recheck HL in 8 hours  Daily CBC/HL  Continue to monitor for s/s of bleeding    Royetta Asal, PharmD, BCPS 10/18/2018 5:50 AM

## 2018-10-18 NOTE — Progress Notes (Signed)
PROGRESS NOTE  [place] GEZ:[id number] DOB: 06-May-1955 DOA: 09/30/2018 PCP: Patient, No Pcp Per   LOS: 7 days   Brief Narrative / Interim history: Dragan Vick is an 63 y.o. male diagnosed with COVID-19 last week test on 10/19/2018 was positive, presents to the ED with progressive shortness of breath, cough and diarrhea.  When EMS arrived at his house he was satting in the 60s on room air.  Patient was admitted to the ICU and intubated within 24 hours of arrival.  Significant events: 9/14 admitted 9/15 intubated 9/17 cardiopulmonary arrest, asystole, ROSC in 9 minutes per chart 9/17 femoral line 9/18 DVT found, started on heparin 9/20 DC femoral line, insert left IJ 9/20 bronchoscopy for mucous plugging 9/21 hemodialysis catheter placement, start CRRT  Subjective/interval events: -Difficult oxygenation overnight, sedated this morning and is on the vent Assessment & Plan: Active Problems:   COVID-19 virus infection   Acute respiratory failure with hypoxia (HCC)   AKI (acute kidney injury) ([place])   Collapse of left lung  Principal Problem ARDS / Acute hypoxic respiratory failure due to Covid 19 pneumonia -Currently on 100% FiO2 with 12 of PEEP -Respiratory status remains tenuous, fluid overload plays a role.  To start CRRT today  Vent Mode: PRVC FiO2 (%):  [60 %-100 %] 100 % Set Rate:  [34 bmp] 34 bmp Vt Set:  [390 mL] 390 mL PEEP:  [12 cmH20-14 cmH20] 12 cmH20 Plateau Pressure:  [25 cmH20-28 cmH20] 25 cmH20 -Completed Remdesivir on 9/18 -Continue Decadron  COVID-19 Labs  Recent Labs    10/16/18 0445 10/18/18 0443  DDIMER 18.02*  --   CRP 26.1* 31.1*    Lab Results  Component Value Date   SARSCOV2NAA POSITIVE (A) 10/21/2018   Active Problems Cardiac arrest/asystole -On 9/17, ROSC in 9 minutes, epi x3, 2 A of bicarb, short run of A. fib with RVR then normal sinus rhythm -Cardiac arrest possibly caused by PE, as lower extremity Dopplers showed DVTs bilaterally  Hemoptisis -Possibly related to PE/as patient is on anticoagulation, also significant mucus plugging as above.  Continue to closely monitor -No further significant hemoptysis, currently on heparin  Acute DVT -Lower extremity ultrasound obtained on 10/15/2018 showed age-indeterminate DVTs bilaterally.  Have to assume these are acute possibly causing a PE which led to his cardiac arrest.  Unable to obtain a CT angiogram due to renal failure -Continue heparin for now  Acute kidney injury -This is following cardiac arrest.  -Has decent urine output of 2 L over the last 24 hours however he is up to 12 L today.  He looks fluid overloaded.  BUN and creatinine are continuing to increase.  CVP is around 12 -Discussed with nephrology Dr. Carolin Sicks over the phone, to start CRRT today  Hyperkalemia -Due to renal failure, status post [facility] x1 yesterday and x2 today.  Now he will start CRRT  Elevated LFTs, shock liver -Shock liver following cardiac arrest.  LFTs continue to improve  DM2 with hyperglycemia -continue insulin, hold home agents, CBGs as below.  Given hyperglycemia increase long-acting and add 3 units every 4 hours in addition to a sliding scale  CBG (last 3)  Recent Labs    10/18/18 0423 10/18/18 0821 10/18/18 1306  GLUCAP 320* 333* 295*    Scheduled Meds: . artificial tears  1 application Both Eyes Y5K  . chlorhexidine gluconate (MEDLINE KIT)  15 mL Mouth Rinse BID  . Chlorhexidine Gluconate Cloth  6 each Topical Daily  . dexamethasone (DECADRON) injection  6  mg Intravenous Q12H  . famotidine  20 mg Per Tube BID  . feeding supplement (PRO-STAT SUGAR FREE 64)  60 mL Per Tube BID  . feeding supplement (VITAL HIGH PROTEIN)  1,000 mL Per Tube Q24H  . fentaNYL (SUBLIMAZE) injection  50 mcg Intravenous Once  . insulin aspart  0-20 Units Subcutaneous Q4H  . insulin detemir  15 Units Subcutaneous BID  . mouth rinse  15 mL Mouth Rinse 10 times per day  . multivitamin with minerals   1 tablet Per Tube Daily  . polyethylene glycol  17 g Per Tube BID  . sennosides  5 mL Per Tube BID   Continuous Infusions: .  prismasol BGK 4/2.5    . sodium chloride 10 mL/hr at 10/18/18 1400  . ceFEPime (MAXIPIME) IV Stopped (10/18/18 1334)  . cisatracurium (NIMBEX) infusion 3 mcg/kg/min (10/18/18 1400)  . fentaNYL infusion INTRAVENOUS 200 mcg/hr (10/18/18 1400)  . heparin 10,000 units/ 20 mL infusion syringe    . heparin 950 Units/hr (10/18/18 1400)  . midazolam 3 mg/hr (10/18/18 1400)  . norepinephrine (LEVOPHED) Adult infusion Stopped (10/16/18 0111)  . prismasol BGK 0/2.5    . prismasol BGK 4/2.5    . vancomycin 2,000 mg (10/18/18 1407)   PRN Meds:.  DVT prophylaxis: Lovenox Code Status: Full code Family Communication: We will discuss with family in [place] this afternoon, Dwana Curd speaks [place], [phone] Disposition Plan: remain in ICU  Consultants:   PCCM  Antimicrobials:  None    Objective: Vitals:   10/18/18 1200 10/18/18 1210 10/18/18 1300 10/18/18 1400  BP: 132/72 (!) 154/67 133/74 133/77  Pulse: 67 67 67 70  Resp: (!) 34  (!) 34 (!) 34  Temp: 98.8 F (37.1 C)  98.8 F (37.1 C) 98.6 F (37 C)  TempSrc:      SpO2: 95% 96% 96% 96%  Weight:      Height:        Intake/Output Summary (Last 24 hours) at 10/18/2018 1450 Last data filed at 10/18/2018 1400 Gross per 24 hour  Intake 4220.93 ml  Output 2455 ml  Net 1765.93 ml   Filed Weights   10/16/18 0500 10/17/18 0450 10/18/18 0600  Weight: 99.1 kg 99.2 kg 102 kg    Examination:  Constitutional: Sedated, on the vent ENMT: ETT in place Neck: normal, supple Respiratory: Coarse breath sounds bilaterally, no wheezing, breathing with the vent Cardiovascular: Regular rate and rhythm, no murmurs.  Anasarca Abdomen: Soft, non-distended, positive bowel sounds Musculoskeletal: No clubbing or cyanosis Skin: No rashes seen Neurologic: Sedated  Data Reviewed: I have independently reviewed  following labs and imaging studies   CBC: Recent Labs  Lab 10/14/18 2325  10/15/18 0440  10/16/18 0445 10/16/18 0515 10/17/18 0350 10/18/18 0443 10/18/18 0834 10/18/18 1209  WBC 19.7*  --  20.7*  --  11.1*  --  13.4* 9.3  --   --   NEUTROABS 12.7*  --   --   --   --   --   --   --   --   --   HGB 12.8*   < > 13.4   < > 11.7* 12.2* 11.9* 11.1* 11.6* 11.2*  HCT 45.6   < > 45.3   < > 38.3* 36.0* 38.6* 36.8* 34.0* 33.0*  MCV 109.4*  --  102.7*  --  100.5*  --  99.0 100.0  --   --   PLT 209  --  192  --  193  --  232  171  --   --    < > = values in this interval not displayed.   Basic Metabolic Panel: Recent Labs  Lab 10/14/18 2325  10/15/18 0440  10/15/18 1930 10/16/18 0445 10/16/18 0515 10/16/18 1650 10/17/18 0350 10/18/18 0443 10/18/18 0834 10/18/18 1209  NA 145   < > 141   < >  --  145 141  --  143 144 143 145  K 5.8*   < > 4.8   < >  --  5.3* 4.9  --  5.6* 6.0* 5.8* 5.8*  CL 98  --  99  --   --  104  --   --  104 106  --   --   CO2 34*  --  26  --   --  29  --   --  29 29  --   --   GLUCOSE 206*  --  334*  --   --  236*  --   --  267* 365*  --   --   BUN 34*  --  49*  --   --  86*  --   --  108* 144*  --   --   CREATININE 2.26*  --  2.72*  --   --  3.80*  --   --  4.40* 4.58*  --   --   CALCIUM 8.3*  --  7.1*  --   --  7.2*  --   --  7.9* 7.8*  --   --   MG 3.3*  --  2.5*  --  2.5* 2.5*  --  2.5* 2.5* 2.5*  --   --   PHOS  --    < > 8.7*  --  6.6* 5.2*  --  5.2* 4.6 3.7  --   --    < > = values in this interval not displayed.   GFR: Estimated Creatinine Clearance: 18.2 mL/min (A) (by C-G formula based on SCr of 4.58 mg/dL (H)). Liver Function Tests: Recent Labs  Lab 10/14/18 0435 10/15/18 0440 10/16/18 0445 10/17/18 0350 10/18/18 0443  AST 60* 139* 56* 38 43*  ALT 64* 101* 69* 59* 47*  ALKPHOS 108 122 93 100 92  BILITOT 0.6 0.8 0.3 0.2* 0.5  PROT 6.5 5.7* 5.2* 5.7* 5.8*  ALBUMIN 2.5* 2.1* 1.8* 1.9* 1.7*   No results for input(s): LIPASE, AMYLASE in the  last 168 hours. No results for input(s): AMMONIA in the last 168 hours. Coagulation Profile: No results for input(s): INR, PROTIME in the last 168 hours. Cardiac Enzymes: No results for input(s): CKTOTAL, CKMB, CKMBINDEX, TROPONINI in the last 168 hours. BNP (last 3 results) No results for input(s): PROBNP in the last 8760 hours. HbA1C: No results for input(s): HGBA1C in the last 72 hours. CBG: Recent Labs  Lab 10/17/18 1946 10/17/18 2346 10/18/18 0423 10/18/18 0821 10/18/18 1306  GLUCAP 197* 246* 320* 333* 295*   Lipid Profile: No results for input(s): CHOL, HDL, LDLCALC, TRIG, CHOLHDL, LDLDIRECT in the last 72 hours. Thyroid Function Tests: No results for input(s): TSH, T4TOTAL, FREET4, T3FREE, THYROIDAB in the last 72 hours. Anemia Panel: No results for input(s): VITAMINB12, FOLATE, FERRITIN, TIBC, IRON, RETICCTPCT in the last 72 hours. Urine analysis:    Component Value Date/Time   COLORURINE YELLOW 10/17/2018 0215   APPEARANCEUR HAZY (A) 10/17/2018 0215   LABSPEC 1.013 10/17/2018 0215   PHURINE 5.0 10/17/2018 0215   GLUCOSEU 150 (A) 10/17/2018 0215   HGBUR  SMALL (A) 10/17/2018 0215   HGBUR negative 12/22/2007 0826   BILIRUBINUR NEGATIVE 10/17/2018 0215   BILIRUBINUR negative 12/12/2014 1241   KETONESUR NEGATIVE 10/17/2018 0215   PROTEINUR NEGATIVE 10/17/2018 0215   UROBILINOGEN 0.2 12/12/2014 1241   UROBILINOGEN 0.2 12/22/2007 0826   NITRITE NEGATIVE 10/17/2018 0215   LEUKOCYTESUR MODERATE (A) 10/17/2018 0215   Sepsis Labs: Invalid input(s): PROCALCITONIN, LACTICIDVEN  Recent Results (from the past 240 hour(s))  SARS Coronavirus 2 [facility] order, Performed in [facility] hospital lab) Nasopharyngeal Nasopharyngeal Swab     Status: Abnormal   Collection Time: 10/08/2018 11:30 AM   Specimen: Nasopharyngeal Swab  Result Value Ref Range Status   SARS Coronavirus 2 POSITIVE (A) NEGATIVE Final    Comment: RESULT CALLED TO, READ BACK BY AND VERIFIED WITH: RN H  HARDY AT 1304 10/02/2018 BY L BENFIELD (NOTE) If result is NEGATIVE SARS-CoV-2 target nucleic acids are NOT DETECTED. The SARS-CoV-2 RNA is generally detectable in upper and lower  respiratory specimens during the acute phase of infection. The lowest  concentration of SARS-CoV-2 viral copies this assay can detect is 250  copies / mL. A negative result does not preclude SARS-CoV-2 infection  and should not be used as the sole basis for treatment or other  patient management decisions.  A negative result may occur with  improper specimen collection / handling, submission of specimen other  than nasopharyngeal swab, presence of viral mutation(s) within the  areas targeted by this assay, and inadequate number of viral copies  (<250 copies / mL). A negative result must be combined with clinical  observations, patient history, and epidemiological information. If result is POSITIVE SARS-CoV-2 target nucleic acids are DETEC TED. The SARS-CoV-2 RNA is generally detectable in upper and lower  respiratory specimens during the acute phase of infection.  Positive  results are indicative of active infection with SARS-CoV-2.  Clinical  correlation with patient history and other diagnostic information is  necessary to determine patient infection status.  Positive results do  not rule out bacterial infection or co-infection with other viruses. If result is PRESUMPTIVE POSTIVE SARS-CoV-2 nucleic acids MAY BE PRESENT.   A presumptive positive result was obtained on the submitted specimen  and confirmed on repeat testing.  While 2019 novel coronavirus  (SARS-CoV-2) nucleic acids may be present in the submitted sample  additional confirmatory testing may be necessary for epidemiological  and / or clinical management purposes  to differentiate between  SARS-CoV-2 and other Sarbecovirus currently known to infect humans.  If clinically indicated additional testing with an alternate test  methodology ([id number]) is advised. The SARS-CoV-2 RNA is generally  detectable in upper and lower respiratory specimens during the acute  phase of infection. The expected result is Negative. Fact Sheet for Patients:  StrictlyIdeas.no Fact Sheet for Healthcare Providers: BankingDealers.co.za This test is not yet approved or cleared by the [place] FDA and has been authorized for detection and/or diagnosis of SARS-CoV-2 by FDA under an Emergency Use Authorization (EUA).  This EUA will remain in effect (meaning this test can be used) for the duration of the COVID-19 declaration under Section 564(b)(1) of the Act, 21 U.S.C. section 360bbb-3(b)(1), unless the authorization is terminated or revoked sooner. Performed at [facility], [place] [address] [place], [place] [address]   MRSA PCR Screening     Status: None   Collection Time: 10/12/18  9:05 PM   Specimen: Nasal Mucosa; Nasopharyngeal  Result Value Ref Range Status   MRSA  by PCR NEGATIVE NEGATIVE Final    Comment:        The GeneXpert MRSA Assay (FDA approved for NASAL specimens only), is one component of a comprehensive MRSA colonization surveillance program. It is not intended to diagnose MRSA infection nor to guide or monitor treatment for MRSA infections. Performed at [facility], [place] [address], [place], [place] [address]   Culture, bal-quantitative     Status: Abnormal (Preliminary result)   Collection Time: 10/17/18 11:37 AM   Specimen: Bronchoalveolar Lavage; Respiratory  Result Value Ref Range Status   Specimen Description   Final    BRONCHIAL ALVEOLAR LAVAGE Performed at [place] [address] [place], [place] [address]    Special Requests   Final    NONE Performed at [facility], [place] [address], [place], [place] [address]    Gram Stain   Final    FEW WBC PRESENT, PREDOMINANTLY MONONUCLEAR MODERATE GRAM  POSITIVE COCCI FEW GRAM NEGATIVE COCCI RARE GRAM NEGATIVE RODS    Culture (A)  Final    >=100,000 COLONIES/mL PROTEUS MIRABILIS SUSCEPTIBILITIES TO FOLLOW Performed at [facility], [place] [address], [place], [place] [address]    Report Status PENDING  Incomplete     Radiology Studies: US Renal  Result Date: 10/18/2018 CLINICAL DATA:  Acute kidney injury. EXAM: RENAL / URINARY TRACT ULTRASOUND COMPLETE COMPARISON:  None. FINDINGS: Right Kidney: Renal measurements: 13.1 x 6.0 x 6.5 cm = volume: 268 mL . Echogenicity within normal limits. No mass or hydronephrosis visualized. Left Kidney: Renal measurements: 15.3 x 6.7 x 6.0 cm = volume: 323 mL. Echogenicity within normal limits. No mass or hydronephrosis visualized. Bladder: Foley catheter in the bladder. IMPRESSION: Kidneys are slightly enlarged, as can be seen with acute nephritis. No sign of focal lesion or obstruction Electronically Signed   By: Nelson Chimes M.D.   On: 10/18/2018 14:22   Dg Chest Port 1 View  Result Date: 10/18/2018 CLINICAL DATA:  Central line placement. EXAM: PORTABLE CHEST 1 VIEW COMPARISON:  10/17/2018. FINDINGS: Endotracheal tube, NG tube, left IJ line stable position. Right IJ line placement with tip over superior vena cava. No pneumothorax. Cardiomegaly with bilateral pulmonary infiltrates/edema bilateral pleural effusions. Findings most consistent CHF. Findings have worsened from prior exam. IMPRESSION: 1. Interim placement right IJ line. Its tip is over the superior vena cava. No pneumothorax. Remaining lines and tubes in stable position. 2. Cardiomegaly with diffuse bilateral pulmonary infiltrates/edema bilateral pleural effusions. Findings most consistent with CHF. Electronically Signed   By: Marcello Moores  Register   On: 10/18/2018 12:33   Dg Chest Port 1 View  Result Date: 10/17/2018 CLINICAL DATA:  Collapse of left lung, status post bronchoscopy EXAM: PORTABLE CHEST - 1 VIEW COMPARISON:  Earlier film of the same  day FINDINGS: No pneumothorax. Significantly improved aeration of the previously opacified left hemithorax. There are mild interstitial and alveolar opacities in the left upper lung and residual patchy areas of consolidation/atelectasis around the left hilum and in the retrocardiac region. On the right, stable lower lung interstitial and alveolar opacities. Endotracheal tube, gastric tube, and left central venous catheter stable. Heart size and mediastinal contours are within normal limits. No definite effusion. Visualized bones unremarkable. IMPRESSION: 1. Improved left lung aeration with residual airspace opacities as above. 2. No pneumothorax. 3.  Support hardware stable in position. Electronically Signed   By: Lucrezia Europe M.D.   On: 10/17/2018 11:40   Dg Chest [facility]  Result Date: 10/17/2018 CLINICAL DATA:  Intubation.  Encounter for central line care. EXAM: PORTABLE CHEST 1 VIEW COMPARISON:  10/14/2018 FINDINGS: There is new complete opacification of the left hemithorax. Right lung demonstrates interstitial and mild hazy opacities. New left internal jugular central venous line has its tip in the mid to lower superior vena cava. No pneumothorax. Endotracheal tube and nasal/orogastric tube are stable in well positioned. IMPRESSION: 1. New left internal jugular central venous line tip projects in the mid to lower superior vena cava. No pneumothorax. 2. New complete opacification of left hemithorax. This is consistent with diffuse lung consolidation and/or atelectasis with a probable pleural fluid component. Electronically Signed   By: Lajean Manes M.D.   On: 10/17/2018 10:20    The patient is critically ill with multiple organ system failure and requires high complexity decision making for assessment and support, frequent evaluation and titration of therapies, advanced monitoring, review of radiographic studies and interpretation of complex data.    Critical Care Time devoted to patient care  services, exclusive of separately billable procedures, described in this note is 35 minutes.     Marzetta Board, MD, PhD [facility]  Contact via  [email or web address]  [place] P: [phone] F: [phone]   Family communication: William Hamburger [facility]) [phone] Joss Mcdill (Brother) [phone] Damarien Nyman (Sister) [phone] Dwana Curd [facility]) [phone] (English fluent)

## 2018-10-18 NOTE — Progress Notes (Signed)
Pt's BIS now reading at 0 without any changes from sedation. Dr. Vaughan Browner notified. No new orders at this time.

## 2018-10-18 NOTE — Progress Notes (Signed)
Reddened and warm to touch area noted to left chest. Left sternal border to left shoulder across chest - from nipple line to below clavicle. Borders marked with pen.

## 2018-10-19 DIAGNOSIS — N179 Acute kidney failure, unspecified: Secondary | ICD-10-CM | POA: Diagnosis not present

## 2018-10-19 DIAGNOSIS — U071 COVID-19: Secondary | ICD-10-CM | POA: Diagnosis not present

## 2018-10-19 DIAGNOSIS — J80 Acute respiratory distress syndrome: Secondary | ICD-10-CM | POA: Diagnosis not present

## 2018-10-19 DIAGNOSIS — J9601 Acute respiratory failure with hypoxia: Secondary | ICD-10-CM | POA: Diagnosis not present

## 2018-10-19 LAB — RENAL FUNCTION PANEL
Albumin: 1.9 g/dL — ABNORMAL LOW (ref 3.5–5.0)
Albumin: 1.7 g/dL — ABNORMAL LOW (ref 3.5–5.0)
Anion gap: 9 (ref 5–15)
Anion gap: 9 (ref 5–15)
BUN: 98 mg/dL — ABNORMAL HIGH (ref 6–20)
BUN: 82 mg/dL — ABNORMAL HIGH (ref 6–20)
CO2: 28 mmol/L (ref 22–32)
CO2: 26 mmol/L (ref 22–32)
Calcium: 8 mg/dL — ABNORMAL LOW (ref 8.9–10.3)
Calcium: 7.8 mg/dL — ABNORMAL LOW (ref 8.9–10.3)
Chloride: 106 mmol/L (ref 98–111)
Chloride: 104 mmol/L (ref 98–111)
Creatinine, Ser: 2.94 mg/dL — ABNORMAL HIGH (ref 0.61–1.24)
Creatinine, Ser: 2.24 mg/dL — ABNORMAL HIGH (ref 0.61–1.24)
GFR calc Af Amer: 26 mL/min — ABNORMAL LOW (ref >60)
GFR calc Af Amer: 36 mL/min — ABNORMAL LOW (ref >60)
GFR calc non Af Amer: 22 mL/min — ABNORMAL LOW (ref >60)
GFR calc non Af Amer: 31 mL/min — ABNORMAL LOW (ref >60)
Glucose, Bld: 193 mg/dL — ABNORMAL HIGH (ref 70–99)
Glucose, Bld: 209 mg/dL — ABNORMAL HIGH (ref 70–99)
Phosphorus: 4.1 mg/dL (ref 2.5–4.6)
Phosphorus: 2.6 mg/dL (ref 2.5–4.6)
Potassium: 5.4 mmol/L — ABNORMAL HIGH (ref 3.5–5.1)
Potassium: 5.2 mmol/L — ABNORMAL HIGH (ref 3.5–5.1)
Sodium: 143 mmol/L (ref 135–145)
Sodium: 139 mmol/L (ref 135–145)

## 2018-10-19 LAB — POCT I-STAT 7, (LYTES, BLD GAS, ICA,H+H)
Acid-Base Excess: 1 mmol/L (ref 0.0–2.0)
Bicarbonate: 29 mmol/L — ABNORMAL HIGH (ref 20.0–28.0)
Bicarbonate: 26.6 mmol/L (ref 20.0–28.0)
Bicarbonate: 26.3 mmol/L (ref 20.0–28.0)
Calcium, Ion: 1.18 mmol/L (ref 1.15–1.40)
Calcium, Ion: 1.18 mmol/L (ref 1.15–1.40)
Calcium, Ion: 1.14 mmol/L — ABNORMAL LOW (ref 1.15–1.40)
HCT: 34 % — ABNORMAL LOW (ref 39.0–52.0)
HCT: 33 % — ABNORMAL LOW (ref 39.0–52.0)
HCT: 35 % — ABNORMAL LOW (ref 39.0–52.0)
Hemoglobin: 11.6 g/dL — ABNORMAL LOW (ref 13.0–17.0)
Hemoglobin: 11.2 g/dL — ABNORMAL LOW (ref 13.0–17.0)
Hemoglobin: 11.9 g/dL — ABNORMAL LOW (ref 13.0–17.0)
O2 Saturation: 96 %
O2 Saturation: 84 %
O2 Saturation: 85 %
Patient temperature: 36.6
Patient temperature: 36.6
Patient temperature: 97.9
Potassium: 5.4 mmol/L — ABNORMAL HIGH (ref 3.5–5.1)
Potassium: 5.1 mmol/L (ref 3.5–5.1)
Potassium: 4.8 mmol/L (ref 3.5–5.1)
Sodium: 143 mmol/L (ref 135–145)
Sodium: 140 mmol/L (ref 135–145)
Sodium: 140 mmol/L (ref 135–145)
TCO2: 31 mmol/L (ref 22–32)
TCO2: 28 mmol/L (ref 22–32)
TCO2: 28 mmol/L (ref 22–32)
pCO2 arterial: 68.5 mmHg (ref 32.0–48.0)
pCO2 arterial: 46.4 mmHg (ref 32.0–48.0)
pCO2 arterial: 50 mmHg — ABNORMAL HIGH (ref 32.0–48.0)
pH, Arterial: 7.233 — ABNORMAL LOW (ref 7.350–7.450)
pH, Arterial: 7.365 (ref 7.350–7.450)
pH, Arterial: 7.327 — ABNORMAL LOW (ref 7.350–7.450)
pO2, Arterial: 95 mmHg (ref 83.0–108.0)
pO2, Arterial: 50 mmHg — ABNORMAL LOW (ref 83.0–108.0)
pO2, Arterial: 53 mmHg — ABNORMAL LOW (ref 83.0–108.0)

## 2018-10-19 LAB — GLUCOSE, CAPILLARY
Glucose-Capillary: 173 mg/dL — ABNORMAL HIGH (ref 70–99)
Glucose-Capillary: 176 mg/dL — ABNORMAL HIGH (ref 70–99)
Glucose-Capillary: 178 mg/dL — ABNORMAL HIGH (ref 70–99)
Glucose-Capillary: 181 mg/dL — ABNORMAL HIGH (ref 70–99)
Glucose-Capillary: 192 mg/dL — ABNORMAL HIGH (ref 70–99)
Glucose-Capillary: 205 mg/dL — ABNORMAL HIGH (ref 70–99)
Glucose-Capillary: 225 mg/dL — ABNORMAL HIGH (ref 70–99)

## 2018-10-19 LAB — APTT: aPTT: 61 seconds — ABNORMAL HIGH (ref 24–36)

## 2018-10-19 LAB — HEPARIN LEVEL (UNFRACTIONATED)
Heparin Unfractionated: 0.4 IU/mL (ref 0.30–0.70)
Heparin Unfractionated: 0.16 IU/mL — ABNORMAL LOW (ref 0.30–0.70)

## 2018-10-19 LAB — ACID FAST SMEAR (AFB, MYCOBACTERIA): Acid Fast Smear: NEGATIVE

## 2018-10-19 LAB — MAGNESIUM: Magnesium: 2.6 mg/dL — ABNORMAL HIGH (ref 1.7–2.4)

## 2018-10-19 MED ORDER — CISATRACURIUM BESYLATE (PF) 10 MG/5ML IV SOLN
0.1000 mg/kg | INTRAVENOUS | Status: DC | PRN
  Filled 2018-10-19: qty 4.9

## 2018-10-19 MED ORDER — VANCOMYCIN HCL IN DEXTROSE 1-5 GM/200ML-% IV SOLN
1000.0000 mg | INTRAVENOUS | Status: DC
  Administered 2018-10-19: 18:00:00 1000 mg via INTRAVENOUS
  Filled 2018-10-19: qty 200

## 2018-10-19 MED ORDER — HEPARIN (PORCINE) 25000 UT/250ML-% IV SOLN
1150.0000 [IU]/h | INTRAVENOUS | Status: DC
  Filled 2018-10-19: qty 250

## 2018-10-19 MED ORDER — VITAL 1.5 CAL PO LIQD
1000.0000 mL | ORAL | Status: DC
  Administered 2018-10-19 – 2018-10-24 (×5): 1000 mL
  Filled 2018-10-19 (×8): qty 1000

## 2018-10-19 MED ORDER — PRO-STAT SUGAR FREE PO LIQD
60.0000 mL | Freq: Three times a day (TID) | ORAL | Status: DC
  Administered 2018-10-19 – 2018-10-25 (×17): 60 mL
  Filled 2018-10-19 (×16): qty 60

## 2018-10-19 MED ORDER — PRISMASOL BGK 0/2.5 32-2.5 MEQ/L REPLACEMENT SOLN
Status: DC
  Administered 2018-10-19 – 2018-10-23 (×9): via INTRAVENOUS_CENTRAL
  Filled 2018-10-19 (×12): qty 5000

## 2018-10-19 MED ORDER — STERILE WATER FOR INJECTION IJ SOLN
INTRAMUSCULAR | Status: AC
  Administered 2018-10-20: 01:00:00
  Filled 2018-10-19: qty 10

## 2018-10-19 MED ORDER — VECURONIUM BROMIDE 10 MG IV SOLR
0.1000 mg/kg | INTRAVENOUS | Status: DC | PRN
  Administered 2018-10-19 – 2018-10-24 (×12): 9.8 mg via INTRAVENOUS
  Filled 2018-10-19 (×12): qty 10

## 2018-10-19 MED ORDER — FAMOTIDINE 40 MG/5ML PO SUSR
20.0000 mg | Freq: Every day | ORAL | Status: DC
  Administered 2018-10-19 – 2018-10-24 (×6): 20 mg
  Filled 2018-10-19 (×6): qty 2.5

## 2018-10-19 MED ORDER — SODIUM CHLORIDE 0.9% FLUSH
10.0000 mL | Freq: Two times a day (BID) | INTRAVENOUS | Status: DC
  Administered 2018-10-19 – 2018-10-25 (×8): 10 mL

## 2018-10-19 MED ORDER — SODIUM CHLORIDE 0.9% FLUSH: 10.0000 mL | INTRAVENOUS | Status: DC | PRN

## 2018-10-19 NOTE — Progress Notes (Signed)
Pt's head turned at this time to the right without complications.

## 2018-10-19 NOTE — Progress Notes (Signed)
ANTICOAGULATION CONSULT NOTE - Follow Up Consult  Pharmacy Consult for Heparin Indication: pulmonary embolus  No Known Allergies  Patient Measurements: Height: 5\' 3"  (160 cm) Weight: 224 lb 13.9 oz (102 kg) IBW/kg (Calculated) : 56.9 Heparin Dosing Weight: 79 kg  Vital Signs: Temp: 97 F (36.1 C) (09/22 0000) Temp Source: Esophageal (09/22 0000) BP: 106/63 (09/22 0000) Pulse Rate: 61 (09/22 0000)  Labs: Recent Labs    10/16/18 0445  10/16/18 1310 10/17/18 0350 10/18/18 0200 10/18/18 0443 10/18/18 0834 10/18/18 1209 10/18/18 1631 10/18/18 2205  HGB 11.7*   < >  --  11.9*  --  11.1* 11.6* 11.2*  --   --   HCT 38.3*   < >  --  38.6*  --  36.8* 34.0* 33.0*  --   --   PLT 193  --   --  232  --  171  --   --   --   --   HEPARINUNFRC 0.62  --  0.71*  --  <0.10*  --   --   --   --  0.29*  CREATININE 3.80*  --   --  4.40*  --  4.58*  --   --  4.45*  --    < > = values in this interval not displayed.    Estimated Creatinine Clearance: 18.7 mL/min (A) (by C-G formula based on SCr of 4.45 mg/dL (H)).   Assessment: Thomas Gill is a 63 y.o. male presenting on 9/14 with worsening O2 saturation, cough, and diarrhea. Patient was diagnosed with covid last week. On 9/17, patient decompensated requiring intubation. Last night patient had a cardiorespiratory arrest/code blue event that lasted approximately 9 minutes until achieving ROSC.  Patient was not any PTA anticoagulation but was started on IV heparin for acute bilateral DVT's. Heparin was paused on 9/19 given oral bleeding which has resolved. Per MD, okay to resume IV heparin at 1800 on 9/20 . H/H low stable, Plt wnl   Today, 10/19/18  HL 0.29, sub therapetuic  Hgb 11.9, plt 232, low but stable ( 9/21)   No line or bleeding issues   Pt started on heparin on 9/21   Goal of Therapy:  Heparin level 0.3-0.5 units/ml Monitor platelets by anticoagulation protocol: Yes   Plan:   Increase heparin infusion to 1000 units/hr.  Will avoid boluses given bleeding   Recheck HL in 8 hours  Daily CBC/HL  Continue to monitor for s/s of bleeding    Royetta Asal, PharmD, BCPS 10/19/2018 1:21 AM

## 2018-10-19 NOTE — Progress Notes (Signed)
Pt proned at this time.  2x Rt and 3x Rn present for procedure.  No complications to note. Head turned to left side.

## 2018-10-19 NOTE — Progress Notes (Signed)
Patient remains proned. RTx2. RNx1 assisted with head repositioning.  ETT remains secured. No complications.

## 2018-10-19 NOTE — Progress Notes (Signed)
[facility] NEPHROLOGY PROGRESS NOTE  Assessment/ Plan: Pt is a 63 y.o. yo male with a COVID-19 presented with shortness of breath, cough diarrhea.  He had cardiopulmonary arrest on 9/17 with ROSC 9 minutes.  He is intubated and now with worsening renal failure and hyperkalemia.  #Nonoliguric AKI likely ATN after cardiac arrest and COVID-19 infection. Started CRRT on 10/18/2018. I spoke with patient's sister [name] who is in [place].  I used [employer] interpreter # [phone] and discussed at length.  Verbal consent obtained for catheter placement and dialysis treatment.  Continue IV heparin for the anticoagulation.  UF goal 50-100 cc an hour.  Ultrasound kidneys with large kidneys without obstruction. -Left IJ temporary catheter placed by PCCM on 9/21.  #Hyperkalemia: Changed pre-and post filter to 0K bath and 4K bath and dialysate.  Also due to acidosis.  #COVID-19 pneumonia/ARDS/vent dependent respiratory failure: Very high FiO2 with poor oxygenation.  Per PCCM and primary team.  #Cardiac arrest on 9/17: 9 minutes ROSC.  #DVT: IV heparin, per pulmonary.  #Elevated LFTs, shock liver.  Subjective: Chart and lab results reviewed.  Discussed with Dr. Gloris Ham today.  Tolerating CRRT well.  No issue with catheter. Objective Vital signs in last 24 hours: Vitals:   10/19/18 0523 10/19/18 0600 10/19/18 0700 10/19/18 0800  BP: (!) 143/79 117/67 126/77 130/73  Pulse: 90 72 77 79  Resp: (!) 34 (!) 34 (!) 30 (!) 34  Temp:  97.7 F (36.5 C) 98.4 F (36.9 C) 99 F (37.2 C)  TempSrc:    Esophageal  SpO2: 100% 96% 98% 100%  Weight:      Height:       Weight change: -3.6 kg  Intake/Output Summary (Last 24 hours) at 10/19/2018 1029 Last data filed at 10/19/2018 0900 Gross per 24 hour  Intake 3398.87 ml  Output 3581 ml  Net -182.13 ml       Labs: Basic Metabolic Panel: Recent Labs  Lab 10/18/18 0443  10/18/18 1631 10/19/18 0430 10/19/18 0623  NA 144   < > 144 143 143  K  6.0*   < > 5.7* 5.4* 5.4*  CL 106  --  110 106  --   CO2 29  --  26 28  --   GLUCOSE 365*  --  304* 193*  --   BUN 144*  --  136* 98*  --   CREATININE 4.58*  --  4.45* 2.94*  --   CALCIUM 7.8*  --  7.7* 8.0*  --   PHOS 3.7  --  3.9 4.1  --    < > = values in this interval not displayed.   Liver Function Tests: Recent Labs  Lab 10/16/18 0445 10/17/18 0350 10/18/18 0443 10/18/18 1631 10/19/18 0430  AST 56* 38 43*  --   --   ALT 69* 59* 47*  --   --   ALKPHOS 93 100 92  --   --   BILITOT 0.3 0.2* 0.5  --   --   PROT 5.2* 5.7* 5.8*  --   --   ALBUMIN 1.8* 1.9* 1.7* 1.7* 1.9*   No results for input(s): LIPASE, AMYLASE in the last 168 hours. No results for input(s): AMMONIA in the last 168 hours. CBC: Recent Labs  Lab 10/14/18 2325  10/15/18 0440  10/16/18 0445  10/17/18 0350 10/18/18 0443 10/18/18 0834 10/18/18 1209 10/19/18 0623  WBC 19.7*  --  20.7*  --  11.1*  --  13.4* 9.3  --   --   --  NEUTROABS 12.7*  --   --   --   --   --   --   --   --   --   --   HGB 12.8*   < > 13.4   < > 11.7*   < > 11.9* 11.1* 11.6* 11.2* 11.6*  HCT 45.6   < > 45.3   < > 38.3*   < > 38.6* 36.8* 34.0* 33.0* 34.0*  MCV 109.4*  --  102.7*  --  100.5*  --  99.0 100.0  --   --   --   PLT 209  --  192  --  193  --  232 171  --   --   --    < > = values in this interval not displayed.   Cardiac Enzymes: No results for input(s): CKTOTAL, CKMB, CKMBINDEX, TROPONINI in the last 168 hours. CBG: Recent Labs  Lab 10/18/18 1609 10/18/18 2021 10/19/18 0011 10/19/18 0430 10/19/18 0747  GLUCAP 277* 141* 205* 173* 192*    Iron Studies: No results for input(s): IRON, TIBC, TRANSFERRIN, FERRITIN in the last 72 hours. Studies/Results: US Renal  Result Date: 10/18/2018 CLINICAL DATA:  Acute kidney injury. EXAM: RENAL / URINARY TRACT ULTRASOUND COMPLETE COMPARISON:  None. FINDINGS: Right Kidney: Renal measurements: 13.1 x 6.0 x 6.5 cm = volume: 268 mL . Echogenicity within normal limits. No mass or  hydronephrosis visualized. Left Kidney: Renal measurements: 15.3 x 6.7 x 6.0 cm = volume: 323 mL. Echogenicity within normal limits. No mass or hydronephrosis visualized. Bladder: Foley catheter in the bladder. IMPRESSION: Kidneys are slightly enlarged, as can be seen with acute nephritis. No sign of focal lesion or obstruction Electronically Signed   By: Nelson Chimes M.D.   On: 10/18/2018 14:22   Dg Chest Port 1 View  Result Date: 10/18/2018 CLINICAL DATA:  Central line placement. EXAM: PORTABLE CHEST 1 VIEW COMPARISON:  10/17/2018. FINDINGS: Endotracheal tube, NG tube, left IJ line stable position. Right IJ line placement with tip over superior vena cava. No pneumothorax. Cardiomegaly with bilateral pulmonary infiltrates/edema bilateral pleural effusions. Findings most consistent CHF. Findings have worsened from prior exam. IMPRESSION: 1. Interim placement right IJ line. Its tip is over the superior vena cava. No pneumothorax. Remaining lines and tubes in stable position. 2. Cardiomegaly with diffuse bilateral pulmonary infiltrates/edema bilateral pleural effusions. Findings most consistent with CHF. Electronically Signed   By: Marcello Moores  Register   On: 10/18/2018 12:33   Dg Chest Port 1 View  Result Date: 10/17/2018 CLINICAL DATA:  Collapse of left lung, status post bronchoscopy EXAM: PORTABLE CHEST - 1 VIEW COMPARISON:  Earlier film of the same day FINDINGS: No pneumothorax. Significantly improved aeration of the previously opacified left hemithorax. There are mild interstitial and alveolar opacities in the left upper lung and residual patchy areas of consolidation/atelectasis around the left hilum and in the retrocardiac region. On the right, stable lower lung interstitial and alveolar opacities. Endotracheal tube, gastric tube, and left central venous catheter stable. Heart size and mediastinal contours are within normal limits. No definite effusion. Visualized bones unremarkable. IMPRESSION: 1. Improved  left lung aeration with residual airspace opacities as above. 2. No pneumothorax. 3.  Support hardware stable in position. Electronically Signed   By: Lucrezia Europe M.D.   On: 10/17/2018 11:40    Medications: Infusions: . sodium chloride 10 mL/hr at 10/19/18 0900  . ceFEPime (MAXIPIME) IV Stopped (10/19/18 0138)  . cisatracurium (NIMBEX) infusion 2 mcg/kg/min (10/19/18 0900)  .  fentaNYL infusion INTRAVENOUS 200 mcg/hr (10/19/18 0900)  . heparin 10,000 units/ 20 mL infusion syringe 500 Units/hr (10/19/18 0900)  . heparin 1,000 Units/hr (10/19/18 0900)  . midazolam 4 mg/hr (10/19/18 0900)  . norepinephrine (LEVOPHED) Adult infusion Stopped (10/16/18 0111)  . prismasol BGK 0/2.5 300 mL/hr at 10/18/18 1715  . prismasol BGK 0/2.5    . prismasol BGK 4/2.5 1,500 mL/hr at 10/19/18 0750    Scheduled Medications: . artificial tears  1 application Both Eyes O3Z  . chlorhexidine gluconate (MEDLINE KIT)  15 mL Mouth Rinse BID  . Chlorhexidine Gluconate Cloth  6 each Topical Daily  . dexamethasone (DECADRON) injection  6 mg Intravenous Q12H  . famotidine  20 mg Per Tube BID  . feeding supplement (PRO-STAT SUGAR FREE 64)  60 mL Per Tube BID  . feeding supplement (VITAL HIGH PROTEIN)  1,000 mL Per Tube Q24H  . insulin aspart  0-20 Units Subcutaneous Q4H  . insulin aspart  3 Units Subcutaneous Q4H  . insulin detemir  20 Units Subcutaneous BID  . mouth rinse  15 mL Mouth Rinse 10 times per day  . multivitamin with minerals  1 tablet Per Tube Daily  . polyethylene glycol  17 g Per Tube BID  . sennosides  5 mL Per Tube BID  . sodium chloride flush  10-40 mL Intracatheter Q12H  . vancomycin variable dose per unstable renal function (pharmacist dosing)   Does not apply See admin instructions    have reviewed scheduled and prn medications.  Dron Prasad Bhandari 10/19/2018,10:29 AM  LOS: 8 days  Pager: [phone]

## 2018-10-19 NOTE — Progress Notes (Signed)
NAMEJavid Gill, MRN:  [id number], DOB:  [birth year], LOS: 8 ADMISSION DATE:  10/05/2018, CONSULTATION DATE:  9/15 REFERRING MD:  Ashok Cordia, CHIEF COMPLAINT:  Dyspnea   Brief History   63 y/o male with DM2 admitted with COVID Pneumonia causing severe acute respiratory failure with hypoxemia.  Intubated on 9/16   Past Medical History  Former smoker DM2 Obesity  [facility]   9/14 admission 9/17 intubated, proned.  Cardiac arrest later in day while he was in prone position.  Central line placed.  Started on Levophed drip 9/18- Paralyzed for vent dysynchrony 9/19- Started heparin for DVT 9/20-bronchoscopy for mucous plugging, left hemithorax opacification 9/21-starting CVVH, restart proning  Consults:  PCCM Nephrology  Procedures:  ETT 9/17>>> Femoral CVL 9/17 >> 9/20 Lt IJ CVL 9/20 >> Rt fem a line 9/21 >> Rt IL HD cath 9/21 >>  Significant Diagnostic Tests:  Lower extremity duplex 9/19> age indeterminate bilateral DVT  Micro Data:  9/14 SARS COV 2 positive BAL 9/20 >> proteus BAL 9/20 AFB, Fungus >   Antimicrobials/COVID Rx  9/14 Decadron >  9/14 Remdesivir >  9/18  Vancomycin 9/21> 9/22 Cefepime 9/21>  Interim history/subjective:  Remains in prone position overnight Remains on CVVHD  Objective   Blood pressure 115/69, pulse (!) 50, temperature 97.9 F (36.6 C), temperature source Esophageal, resp. rate 11, height _0  (1.6 m), weight 98.4 kg, SpO2 96 %. CVP:  [10 mmHg-21 mmHg] 13 mmHg  Vent Mode: PRVC FiO2 (%):  [70 %-100 %] 70 % Set Rate:  [34 bmp-7034 bmp] 7034 bmp Vt Set:  [390 mL] 390 mL PEEP:  [12 cmH20] 12 cmH20 Plateau Pressure:  [23 cmH20-33 cmH20] 24 cmH20   Intake/Output Summary (Last 24 hours) at 10/19/2018 1719 Last data filed at 10/19/2018 1700 Gross per 24 hour  Intake 2597.88 ml  Output 4432 ml  Net -1834.12 ml   Filed Weights   10/17/18 0450 10/18/18 0600 10/19/18 0500  Weight: 99.2 kg 102 kg 98.4 kg     Examination:  General:  In bed on vent HENT: NCAT ETT in place PULM: CTA B, vent supported breathing CV: RRR, no mgr GI: BS+, soft, nontender MSK: normal bulk and tone Neuro: sedated on vent   Resolved Hospital Problem list     Assessment & Plan:  ARDS due to COVID 19 pneumonia: Continue mechanical ventilation per ARDS protocol Target TVol 6-8cc/kgIBW Target Plateau Pressure < 30cm H20 Target driving pressure less than 15 cm of water Target PaO2 55-65: titrate PEEP/FiO2 per protocol As long as PaO2 to FiO2 ratio is less than 1:150 position in prone position for 16 hours a day Check CVP daily if CVL in place Target CVP less than 4, diurese as necessary Ventilator associated pneumonia prevention protocol 9/22 plan: check ABG, adjust vent per protocol, remove volume with CVVHD, prone again tonight  AKI, oliguric CVVHD per renal Monitor BMET Volume remove as able  HCAP: proteus Narrow to cefepime, monitor sensitivities  Cardiac arrest, PE? Tele  Lower extremity DVT Heparin infusion  Need for sedation for mechanical ventilation RASS target -4 to -5 Stop nimbex infusion Change to prn vecuronium  Shock liver Monitor LFT  Hyperglycemia: Levemir/SSI per [facility]  Best practice:  Diet: tube feeding Pain/Anxiety/Delirium protocol (if indicated): as above VAP protocol (if indicated): yes DVT prophylaxis: heparin infusion GI prophylaxis: famotidine Glucose control: SSI Mobility: bed rest Code Status: full Family Communication: I called his nephew in [place] and updated him today Disposition: remain in  ICU  Labs   CBC: Recent Labs  Lab 10/14/18 2325  10/15/18 0440  10/16/18 0445  10/17/18 0350 10/18/18 0443 10/18/18 0834 10/18/18 1209 10/19/18 0623 10/19/18 1640  WBC 19.7*  --  20.7*  --  11.1*  --  13.4* 9.3  --   --   --   --   NEUTROABS 12.7*  --   --   --   --   --   --   --   --   --   --   --   HGB 12.8*   < > 13.4   < > 11.7*   < > 11.9* 11.1* 11.6*  11.2* 11.6* 11.2*  HCT 45.6   < > 45.3   < > 38.3*   < > 38.6* 36.8* 34.0* 33.0* 34.0* 33.0*  MCV 109.4*  --  102.7*  --  100.5*  --  99.0 100.0  --   --   --   --   PLT 209  --  192  --  193  --  232 171  --   --   --   --    < > = values in this interval not displayed.    Basic Metabolic Panel: Recent Labs  Lab 10/16/18 0445  10/16/18 1650 10/17/18 0350 10/18/18 0443  10/18/18 1209 10/18/18 1631 10/19/18 0430 10/19/18 0623 10/19/18 1640  NA 145   < >  --  143 144   < > 145 144 143 143 140  K 5.3*   < >  --  5.6* 6.0*   < > 5.8* 5.7* 5.4* 5.4* 5.1  CL 104  --   --  104 106  --   --  110 106  --   --   CO2 29  --   --  29 29  --   --  26 28  --   --   GLUCOSE 236*  --   --  267* 365*  --   --  304* 193*  --   --   BUN 86*  --   --  108* 144*  --   --  136* 98*  --   --   CREATININE 3.80*  --   --  4.40* 4.58*  --   --  4.45* 2.94*  --   --   CALCIUM 7.2*  --   --  7.9* 7.8*  --   --  7.7* 8.0*  --   --   MG 2.5*  --  2.5* 2.5* 2.5*  --   --   --  2.6*  --   --   PHOS 5.2*  --  5.2* 4.6 3.7  --   --  3.9 4.1  --   --    < > = values in this interval not displayed.   GFR: Estimated Creatinine Clearance: 27.8 mL/min (A) (by C-G formula based on SCr of 2.94 mg/dL (H)). Recent Labs  Lab 10/15/18 0440 10/16/18 0445 10/17/18 0350 10/18/18 0443  WBC 20.7* 11.1* 13.4* 9.3  LATICACIDVEN 3.4*  --   --   --     Liver Function Tests: Recent Labs  Lab 10/14/18 0435 10/15/18 0440 10/16/18 0445 10/17/18 0350 10/18/18 0443 10/18/18 1631 10/19/18 0430  AST 60* 139* 56* 38 43*  --   --   ALT 64* 101* 69* 59* 47*  --   --   ALKPHOS 108 122 93 100 92  --   --  BILITOT 0.6 0.8 0.3 0.2* 0.5  --   --   PROT 6.5 5.7* 5.2* 5.7* 5.8*  --   --   ALBUMIN 2.5* 2.1* 1.8* 1.9* 1.7* 1.7* 1.9*   No results for input(s): LIPASE, AMYLASE in the last 168 hours. No results for input(s): AMMONIA in the last 168 hours.  ABG    Component Value Date/Time   PHART 7.365 10/19/2018 1640    PCO2ART 46.4 10/19/2018 1640   PO2ART 50.0 (L) 10/19/2018 1640   HCO3 26.6 10/19/2018 1640   TCO2 28 10/19/2018 1640   ACIDBASEDEF 1.0 10/15/2018 1550   O2SAT 84.0 10/19/2018 1640     Coagulation Profile: No results for input(s): INR, PROTIME in the last 168 hours.  Cardiac Enzymes: No results for input(s): CKTOTAL, CKMB, CKMBINDEX, TROPONINI in the last 168 hours.  HbA1C: Hgb A1c MFr Bld  Date/Time Value Ref Range Status  09/29/2018 02:30 PM 9.1 (H) 4.8 - 5.6 % Final    Comment:    (NOTE)         Prediabetes: 5.7 - 6.4         Diabetes: >6.4         Glycemic control for adults with diabetes: <7.0     CBG: Recent Labs  Lab 10/19/18 0011 10/19/18 0430 10/19/18 0747 10/19/18 1219 10/19/18 1614  GLUCAP 205* 173* 192* 225* 176*     Critical care time: 40 minutes    Roselie Awkward, MD [facility] Pager: [phone] Cell: [phone] If no response, call [phone]

## 2018-10-19 NOTE — Progress Notes (Signed)
PROGRESS NOTE  Thomas Gill BWL:[id number] DOB: 13-Mar-1955 DOA: 10/01/2018 PCP: Patient, No Pcp Per   LOS: 8 days   Brief Narrative / Interim history: Thomas Gill is an 64 y.o. male diagnosed with COVID-19 last week test on 09/29/2018 was positive, presents to the ED with progressive shortness of breath, cough and diarrhea.  When EMS arrived at his house he was satting in the 60s on room air.  Patient was admitted to the ICU and intubated within 24 hours of arrival.  Significant events: 9/14 admitted 9/15 intubated 9/17 cardiopulmonary arrest, asystole, ROSC in 9 minutes per chart 9/17 femoral line 9/18 DVT found, started on heparin 9/20 DC femoral line, insert left IJ 9/20 bronchoscopy for mucous plugging 9/21 hemodialysis catheter placement, start CRRT  Subjective/interval events: -Remains sedated and on the vent  Assessment & Plan: Active Problems:   COVID-19 virus infection   Acute respiratory failure with hypoxia (HCC)   AKI (acute kidney injury) ([place])   Collapse of left lung  Principal Problem ARDS / Acute hypoxic respiratory failure due to Covid 19 pneumonia -Currently on 100% FiO2 with 12 of PEEP -Respiratory status remains tenuous, fluid overload plays a role.  On CRRT, still remains significantly fluid overloaded, +11 L  Vent Mode: PRVC FiO2 (%):  [70 %-100 %] 70 % Set Rate:  [34 bmp-7034 bmp] 7034 bmp Vt Set:  [390 mL] 390 mL PEEP:  [12 cmH20] 12 cmH20 Plateau Pressure:  [23 cmH20-33 cmH20] 24 cmH20 -Completed Remdesivir on 9/18 -Continue Decadron  COVID-19 Labs  Recent Labs    10/18/18 0443  CRP 31.1*    Lab Results  Component Value Date   SARSCOV2NAA POSITIVE (A) 10/20/2018   Active Problems Cardiac arrest/asystole -On 9/17, ROSC in 9 minutes, epi x3, 2 A of bicarb, short run of A. fib with RVR then normal sinus rhythm -Cardiac arrest possibly caused by PE, as lower extremity Dopplers showed DVTs bilaterally  Hemoptisis -Possibly related to  PE/as patient is on anticoagulation, also significant mucus plugging as above.  Continue to closely monitor -No further significant hemoptysis, continue heparin  Acute DVT -Lower extremity ultrasound obtained on 10/15/2018 showed age-indeterminate DVTs bilaterally.  Have to assume these are acute possibly causing a PE which led to his cardiac arrest.  Unable to obtain a CT angiogram due to renal failure -On heparin  Acute kidney injury -This is following cardiac arrest.  -Has decent urine output of 2 L over the last 24 hours however he is up to 12 L today.  He looks fluid overloaded.  BUN and creatinine are continuing to increase.  CVP is around 12 -On CRRT, remove fluid as able  Hyperkalemia -Due to renal failure, status post Lokelma x 3 total.  Now on CRRT  Elevated LFTs, shock liver -Shock liver following cardiac arrest.  LFTs continue to improve  DM2 with hyperglycemia -continue insulin, hold home agents, CBGs as below.  Continue current regimen every 4 hours  CBG (last 3)  Recent Labs    10/19/18 0430 10/19/18 0747 10/19/18 1219  GLUCAP 173* 192* 225*    Scheduled Meds: . artificial tears  1 application Both Eyes G8T  . chlorhexidine gluconate (MEDLINE KIT)  15 mL Mouth Rinse BID  . Chlorhexidine Gluconate Cloth  6 each Topical Daily  . dexamethasone (DECADRON) injection  6 mg Intravenous Q12H  . famotidine  20 mg Per Tube QHS  . feeding supplement (PRO-STAT SUGAR FREE 64)  60 mL Per Tube BID  . feeding supplement (VITAL  HIGH PROTEIN)  1,000 mL Per Tube Q24H  . insulin aspart  0-20 Units Subcutaneous Q4H  . insulin aspart  3 Units Subcutaneous Q4H  . insulin detemir  20 Units Subcutaneous BID  . mouth rinse  15 mL Mouth Rinse 10 times per day  . multivitamin with minerals  1 tablet Per Tube Daily  . polyethylene glycol  17 g Per Tube BID  . sennosides  5 mL Per Tube BID  . sodium chloride flush  10-40 mL Intracatheter Q12H  . vancomycin variable dose per unstable  renal function (pharmacist dosing)   Does not apply See admin instructions   Continuous Infusions: . sodium chloride 10 mL/hr at 10/19/18 1400  . ceFEPime (MAXIPIME) IV Stopped (10/19/18 1334)  . fentaNYL infusion INTRAVENOUS 200 mcg/hr (10/19/18 1400)  . heparin 1,000 Units/hr (10/19/18 1400)  . midazolam 4 mg/hr (10/19/18 1400)  . norepinephrine (LEVOPHED) Adult infusion Stopped (10/16/18 0111)  . prismasol BGK 0/2.5 300 mL/hr at 10/18/18 1715  . prismasol BGK 0/2.5 400 mL/hr at 10/19/18 1119  . prismasol BGK 4/2.5 1,500 mL/hr at 10/19/18 1127  . vancomycin     PRN Meds:.  DVT prophylaxis: Lovenox Code Status: Full code Family Communication: Family discussion per PCCM Disposition Plan: remain in ICU  Consultants:   PCCM  Antimicrobials:  None    Objective: Vitals:   10/19/18 0800 10/19/18 0920 10/19/18 1140 10/19/18 1200  BP: 130/73 130/73  112/72  Pulse: 79 90  (!) 57  Resp: (!) 34 (!) 25  11  Temp: 99 F (37.2 C) 98.1 F (36.7 C)  98.1 F (36.7 C)  TempSrc: Esophageal   Esophageal  SpO2: 100% 100% 97% 98%  Weight:      Height:        Intake/Output Summary (Last 24 hours) at 10/19/2018 1420 Last data filed at 10/19/2018 1400 Gross per 24 hour  Intake 3122.36 ml  Output 4130 ml  Net -1007.64 ml   Filed Weights   10/17/18 0450 10/18/18 0600 10/19/18 0500  Weight: 99.2 kg 102 kg 98.4 kg    Examination:  Constitutional: Sedated, on the vent, in prone position ENMT: ETT in place Neck: normal, supple Respiratory: Coarse breath sounds bilaterally, no wheezing, breathing with the vent Cardiovascular: Regular rate and rhythm, no murmurs.  Upper and lower extremities swelling, anasarca Abdomen: Positive bowel sounds Musculoskeletal: No clubbing or cyanosis Skin: No rashes seen Neurologic: Sedated  Data Reviewed: I have independently reviewed following labs and imaging studies   CBC: Recent Labs  Lab 10/14/18 2325  10/15/18 0440  10/16/18 0445   10/17/18 0350 10/18/18 0443 10/18/18 0834 10/18/18 1209 10/19/18 0623  WBC 19.7*  --  20.7*  --  11.1*  --  13.4* 9.3  --   --   --   NEUTROABS 12.7*  --   --   --   --   --   --   --   --   --   --   HGB 12.8*   < > 13.4   < > 11.7*   < > 11.9* 11.1* 11.6* 11.2* 11.6*  HCT 45.6   < > 45.3   < > 38.3*   < > 38.6* 36.8* 34.0* 33.0* 34.0*  MCV 109.4*  --  102.7*  --  100.5*  --  99.0 100.0  --   --   --   PLT 209  --  192  --  193  --  232 171  --   --   --    < > =  values in this interval not displayed.   Basic Metabolic Panel: Recent Labs  Lab 10/16/18 0445  10/16/18 1650 10/17/18 0350 10/18/18 0443 10/18/18 0834 10/18/18 1209 10/18/18 1631 10/19/18 0430 10/19/18 0623  NA 145   < >  --  143 144 143 145 144 143 143  K 5.3*   < >  --  5.6* 6.0* 5.8* 5.8* 5.7* 5.4* 5.4*  CL 104  --   --  104 106  --   --  110 106  --   CO2 29  --   --  29 29  --   --  26 28  --   GLUCOSE 236*  --   --  267* 365*  --   --  304* 193*  --   BUN 86*  --   --  108* 144*  --   --  136* 98*  --   CREATININE 3.80*  --   --  4.40* 4.58*  --   --  4.45* 2.94*  --   CALCIUM 7.2*  --   --  7.9* 7.8*  --   --  7.7* 8.0*  --   MG 2.5*  --  2.5* 2.5* 2.5*  --   --   --  2.6*  --   PHOS 5.2*  --  5.2* 4.6 3.7  --   --  3.9 4.1  --    < > = values in this interval not displayed.   GFR: Estimated Creatinine Clearance: 27.8 mL/min (A) (by C-G formula based on SCr of 2.94 mg/dL (H)). Liver Function Tests: Recent Labs  Lab 10/14/18 0435 10/15/18 0440 10/16/18 0445 10/17/18 0350 10/18/18 0443 10/18/18 1631 10/19/18 0430  AST 60* 139* 56* 38 43*  --   --   ALT 64* 101* 69* 59* 47*  --   --   ALKPHOS 108 122 93 100 92  --   --   BILITOT 0.6 0.8 0.3 0.2* 0.5  --   --   PROT 6.5 5.7* 5.2* 5.7* 5.8*  --   --   ALBUMIN 2.5* 2.1* 1.8* 1.9* 1.7* 1.7* 1.9*   No results for input(s): LIPASE, AMYLASE in the last 168 hours. No results for input(s): AMMONIA in the last 168 hours. Coagulation Profile: No results  for input(s): INR, PROTIME in the last 168 hours. Cardiac Enzymes: No results for input(s): CKTOTAL, CKMB, CKMBINDEX, TROPONINI in the last 168 hours. BNP (last 3 results) No results for input(s): PROBNP in the last 8760 hours. HbA1C: No results for input(s): HGBA1C in the last 72 hours. CBG: Recent Labs  Lab 10/18/18 2021 10/19/18 0011 10/19/18 0430 10/19/18 0747 10/19/18 1219  GLUCAP 141* 205* 173* 192* 225*   Lipid Profile: No results for input(s): CHOL, HDL, LDLCALC, TRIG, CHOLHDL, LDLDIRECT in the last 72 hours. Thyroid Function Tests: No results for input(s): TSH, T4TOTAL, FREET4, T3FREE, THYROIDAB in the last 72 hours. Anemia Panel: No results for input(s): VITAMINB12, FOLATE, FERRITIN, TIBC, IRON, RETICCTPCT in the last 72 hours. Urine analysis:    Component Value Date/Time   COLORURINE YELLOW 10/17/2018 0215   APPEARANCEUR HAZY (A) 10/17/2018 0215   LABSPEC 1.013 10/17/2018 0215   PHURINE 5.0 10/17/2018 0215   GLUCOSEU 150 (A) 10/17/2018 0215   HGBUR SMALL (A) 10/17/2018 0215   HGBUR negative 12/22/2007 0826   BILIRUBINUR NEGATIVE 10/17/2018 0215   BILIRUBINUR negative 12/12/2014 1241   KETONESUR NEGATIVE 10/17/2018 0215   PROTEINUR NEGATIVE 10/17/2018 0215   UROBILINOGEN 0.2  12/12/2014 1241   UROBILINOGEN 0.2 12/22/2007 0826   NITRITE NEGATIVE 10/17/2018 0215   LEUKOCYTESUR MODERATE (A) 10/17/2018 0215   Sepsis Labs: Invalid input(s): PROCALCITONIN, LACTICIDVEN  Recent Results (from the past 240 hour(s))  SARS Coronavirus 2 [facility] order, Performed in [facility] hospital lab) Nasopharyngeal Nasopharyngeal Swab     Status: Abnormal   Collection Time: 10/04/2018 11:30 AM   Specimen: Nasopharyngeal Swab  Result Value Ref Range Status   SARS Coronavirus 2 POSITIVE (A) NEGATIVE Final    Comment: RESULT CALLED TO, READ BACK BY AND VERIFIED WITH: RN H HARDY AT 1304 10/02/2018 BY L BENFIELD (NOTE) If result is NEGATIVE SARS-CoV-2 target nucleic acids are NOT  DETECTED. The SARS-CoV-2 RNA is generally detectable in upper and lower  respiratory specimens during the acute phase of infection. The lowest  concentration of SARS-CoV-2 viral copies this assay can detect is 250  copies / mL. A negative result does not preclude SARS-CoV-2 infection  and should not be used as the sole basis for treatment or other  patient management decisions.  A negative result may occur with  improper specimen collection / handling, submission of specimen other  than nasopharyngeal swab, presence of viral mutation(s) within the  areas targeted by this assay, and inadequate number of viral copies  (<250 copies / mL). A negative result must be combined with clinical  observations, patient history, and epidemiological information. If result is POSITIVE SARS-CoV-2 target nucleic acids are DETEC TED. The SARS-CoV-2 RNA is generally detectable in upper and lower  respiratory specimens during the acute phase of infection.  Positive  results are indicative of active infection with SARS-CoV-2.  Clinical  correlation with patient history and other diagnostic information is  necessary to determine patient infection status.  Positive results do  not rule out bacterial infection or co-infection with other viruses. If result is PRESUMPTIVE POSTIVE SARS-CoV-2 nucleic acids MAY BE PRESENT.   A presumptive positive result was obtained on the submitted specimen  and confirmed on repeat testing.  While 2019 novel coronavirus  (SARS-CoV-2) nucleic acids may be present in the submitted sample  additional confirmatory testing may be necessary for epidemiological  and / or clinical management purposes  to differentiate between  SARS-CoV-2 and other Sarbecovirus currently known to infect humans.  If clinically indicated additional testing with an alternate test  methodology (LAB7 453) is advised. The SARS-CoV-2 RNA is generally  detectable in upper and lower respiratory specimens during  the acute  phase of infection. The expected result is Negative. Fact Sheet for Patients:  StrictlyIdeas.no Fact Sheet for Healthcare Providers: BankingDealers.co.za This test is not yet approved or cleared by the [place] FDA and has been authorized for detection and/or diagnosis of SARS-CoV-2 by FDA under an Emergency Use Authorization (EUA).  This EUA will remain in effect (meaning this test can be used) for the duration of the COVID-19 declaration under Section 564(b)(1) of the Act, 21 U.S.C. section 360bbb-3(b)(1), unless the authorization is terminated or revoked sooner. Performed at [facility], [place] [address] [place], [place] [address]   MRSA PCR Screening     Status: None   Collection Time: 10/12/18  9:05 PM   Specimen: Nasal Mucosa; Nasopharyngeal  Result Value Ref Range Status   MRSA by PCR NEGATIVE NEGATIVE Final    Comment:        The GeneXpert MRSA Assay (FDA approved for NASAL specimens only), is one component of a comprehensive MRSA colonization surveillance program. It is  not intended to diagnose MRSA infection nor to guide or monitor treatment for MRSA infections. Performed at [facility], [place] [address], [place], [place] [address]   Culture, bal-quantitative     Status: Abnormal (Preliminary result)   Collection Time: 10/17/18 11:37 AM   Specimen: Bronchoalveolar Lavage; Respiratory  Result Value Ref Range Status   Specimen Description   Final    BRONCHIAL ALVEOLAR LAVAGE Performed at [place] [address] [place], [place] [address]    Special Requests   Final    NONE Performed at [facility], [place] [address] [place], [place] [address]    Gram Stain   Final    FEW WBC PRESENT, PREDOMINANTLY MONONUCLEAR MODERATE GRAM POSITIVE COCCI FEW GRAM NEGATIVE COCCI RARE GRAM NEGATIVE RODS    Culture (A)  Final    >=100,000 COLONIES/mL  PROTEUS MIRABILIS SUSCEPTIBILITIES TO FOLLOW Performed at [facility], [place] [address], [place], [place] [address]    Report Status PENDING  Incomplete  MRSA PCR Screening     Status: None   Collection Time: 10/18/18 11:48 AM   Specimen: Nasal Mucosa; Nasopharyngeal  Result Value Ref Range Status   MRSA by PCR NEGATIVE NEGATIVE Final    Comment:        The GeneXpert MRSA Assay (FDA approved for NASAL specimens only), is one component of a comprehensive MRSA colonization surveillance program. It is not intended to diagnose MRSA infection nor to guide or monitor treatment for MRSA infections. Performed at [facility], [place] [address] [place], [place] [address]      Radiology Studies: US Renal  Result Date: 10/18/2018 CLINICAL DATA:  Acute kidney injury. EXAM: RENAL / URINARY TRACT ULTRASOUND COMPLETE COMPARISON:  None. FINDINGS: Right Kidney: Renal measurements: 13.1 x 6.0 x 6.5 cm = volume: 268 mL . Echogenicity within normal limits. No mass or hydronephrosis visualized. Left Kidney: Renal measurements: 15.3 x 6.7 x 6.0 cm = volume: 323 mL. Echogenicity within normal limits. No mass or hydronephrosis visualized. Bladder: Foley catheter in the bladder. IMPRESSION: Kidneys are slightly enlarged, as can be seen with acute nephritis. No sign of focal lesion or obstruction Electronically Signed   By: Nelson Chimes M.D.   On: 10/18/2018 14:22   Dg Chest Port 1 View  Result Date: 10/18/2018 CLINICAL DATA:  Central line placement. EXAM: PORTABLE CHEST 1 VIEW COMPARISON:  10/17/2018. FINDINGS: Endotracheal tube, NG tube, left IJ line stable position. Right IJ line placement with tip over superior vena cava. No pneumothorax. Cardiomegaly with bilateral pulmonary infiltrates/edema bilateral pleural effusions. Findings most consistent CHF. Findings have worsened from prior exam. IMPRESSION: 1. Interim placement right IJ line. Its tip is over the superior vena cava. No  pneumothorax. Remaining lines and tubes in stable position. 2. Cardiomegaly with diffuse bilateral pulmonary infiltrates/edema bilateral pleural effusions. Findings most consistent with CHF. Electronically Signed   By: Marcello Moores  Register   On: 10/18/2018 12:33   The patient is critically ill with multiple organ system failure and requires high complexity decision making for assessment and support, frequent evaluation and titration of therapies, advanced monitoring, review of radiographic studies and interpretation of complex data.    Critical Care Time devoted to patient care services, exclusive of separately billable procedures, described in this note is 35 minutes.     Marzetta Board, MD, PhD [facility]  Contact via  [email or web address]  [facility] Office Info P: [phone] F: [phone]   Family communication: William Hamburger (  Friend) [phone] Trayshawn Durkin (Brother) [phone] Aran Menning (Sister) [phone] Dwana Curd [facility]) [phone] (English fluent)

## 2018-10-19 NOTE — Progress Notes (Signed)
Inpatient Diabetes Program Recommendations  AACE/ADA: New Consensus Statement on Inpatient Glycemic Control (2015)  Target Ranges:  Prepandial:   less than 140 mg/dL      Peak postprandial:   less than 180 mg/dL (1-2 hours)      Critically ill patients:  140 - 180 mg/dL   Lab Results  Component Value Date   GLUCAP 192 (H) 10/19/2018   HGBA1C 9.1 (H) 10-14-18    Review of Glycemic Control Results for EMBRY, HUSS (MRN [id number]) as of 10/19/2018 11:42  Ref. Range 10/18/2018 20:21 10/19/2018 00:11 10/19/2018 04:30 10/19/2018 07:47  Glucose-Capillary Latest Ref Range: 70 - 99 mg/dL 141 (H) 205 (H) 173 (H) 192 (H)   Diabetes history: Type 2 DM Current orders for Inpatient glycemic control: Levemir 20 units BID, Novolog 0-20 units TID, Novolog 3 units Q4H.  Decadron 6 mg QD  Inpatient Diabetes Program Recommendations:    Consider increasing Levemir to 24 units BID.   Thanks, Bronson Curb, MSN, RNC-OB Diabetes Coordinator [phone] (8a-5p)

## 2018-10-19 NOTE — Progress Notes (Signed)
[facility] Progress Note Patient Name: Thomas Gill DOB: Aug 31, 1955 MRN: [id number]   Date of Service  10/19/2018  HPI/Events of Note  Request to change Nimbex IV PRN to Vecuronium IV PRN.   eICU Interventions  Will order: 1. D/C Nimbex. 2. Vecuronium 0.1 mg/kg IV Q 1 hour PRN ventilator desychrony or TOF goal.      Intervention Category Major Interventions: Other:  Lysle Dingwall 10/19/2018, 8:51 PM

## 2018-10-19 NOTE — Progress Notes (Signed)
Initial Nutrition Assessment  DOCUMENTATION CODES:   Obesity unspecified  INTERVENTION:   D/C Vital High Protein  Initiate Vital 1.5 @ 40 ml/hr 60 ml Prostat TID MVI  Provides: 2040 kcal, 154 grams protein, and 733 ml free water.    NUTRITION DIAGNOSIS:   Increased nutrient needs related to (COVID-19 PNA) as evidenced by estimated needs. Ongoing.   GOAL:   Patient will meet greater than or equal to 90% of their needs Progressing  MONITOR:   TF tolerance  REASON FOR ASSESSMENT:   Consult Enteral/tube feeding initiation and management  ASSESSMENT:   Pt with PMH of obesity and DM admitted 9/14 for COVID-19 PNA.   Plan for cortrak placement 9/23  9/16 Pt became hypoxic and was intubated; proned  9/17 cardiopulmonary arrest 9/20 bronch for mucus plugging 9/21 CRRT started d/t AKI with fluid overload; restart proning   Patient is currently intubated on ventilator support MV: 13.6 L/min Temp (24hrs), Avg:97.4 F (36.3 C), Min:95.2 F (35.1 C), Max:99 F (37.2 C)  Medications reviewed and include: decadron, SSI, levemir 20 units BID, 3 units novolog every 4 hours, MVI, miralax, senokot  Nimbex  Fentanyl Versed  Labs reviewed: K+ 3.4 (H), BUN: 98 (H), Magnesium 2.6 (H), PO4: WNL - all labs prior to CVVHD  CBG's: 173-192-225   UOP: 2177 ml  Pt positive 12 L since admission   TF: Vital High Protien @ 40 ml/hr 60 ml Prostat BID MVI daily Provides: 1360 kcal, 144 grams protein, and 802 ml free water.   NUTRITION - FOCUSED PHYSICAL EXAM:  Deferred   Diet Order:   Diet Order            Diet NPO time specified  Diet effective now              EDUCATION NEEDS:   No education needs have been identified at this time  Skin:  Skin Assessment: Reviewed RN Assessment  Last BM:  9/21 x 3 small  Height:   Ht Readings from Last 1 Encounters:  10/16/18 5\' 3"  (1.6 m)    Weight:   Wt Readings from Last 1 Encounters:  10/19/18 98.4 kg    Ideal  Body Weight:  56.3 kg  BMI:  Body mass index is 38.43 kg/m.  Estimated Nutritional Needs:   Kcal:  2000  Protein:  140-175 grams  Fluid:  > 1.5 L/day  Maylon Peppers RD, LDN, CNSC [phone] Pager [phone] After Hours Pager

## 2018-10-19 NOTE — Progress Notes (Signed)
ANTICOAGULATION CONSULT NOTE - Follow Up Consult  Pharmacy Consult for Heparin Indication: pulmonary embolus  No Known Allergies  Patient Measurements: Height: 5\' 3"  (160 cm) Weight: 216 lb 14.9 oz (98.4 kg) IBW/kg (Calculated) : 56.9 Heparin Dosing Weight: 79 kg  Vital Signs: Temp: 97.9 F (36.6 C) (09/22 1600) Temp Source: Esophageal (09/22 1600) BP: 115/69 (09/22 1600) Pulse Rate: 50 (09/22 1600)  Labs: Recent Labs    10/17/18 0350  10/18/18 0443  10/18/18 1209 10/18/18 1631 10/18/18 2205 10/19/18 0430 10/19/18 0623 10/19/18 0930 10/19/18 1608 10/19/18 1640  HGB 11.9*  --  11.1*   < > 11.2*  --   --   --  11.6*  --   --  11.2*  HCT 38.6*  --  36.8*   < > 33.0*  --   --   --  34.0*  --   --  33.0*  PLT 232  --  171  --   --   --   --   --   --   --   --   --   APTT  --   --   --   --   --   --   --  61*  --   --   --   --   HEPARINUNFRC  --    < >  --   --   --   --  0.29*  --   --  0.40 0.16*  --   CREATININE 4.40*  --  4.58*  --   --  4.45*  --  2.94*  --   --   --   --    < > = values in this interval not displayed.    Estimated Creatinine Clearance: 27.8 mL/min (A) (by C-G formula based on SCr of 2.94 mg/dL (H)).   Assessment: Thomas Gill is a 63 y.o. male presenting on 9/14 with worsening O2 saturation, cough, and diarrhea. Patient was diagnosed with covid last week. On 9/17, patient decompensated requiring intubation. On 9/18 patient had a cardiorespiratory arrest/code blue event that lasted approximately 9 minutes until achieving ROSC.  Patient was not any PTA anticoagulation but was started on IV heparin for acute bilateral DVT's. Heparin was paused on 9/19 given oral bleeding which has resolved. Per MD, okay to resume IV heparin at 1800 on 9/20 . H/H low stable, Plt wnl   Heparin level came back to subtherapeutic this PM. Rn stated that it has been run fine. His CRRT heparin has been stopped. We will increase his peripheral heparin this PM.   Goal of  Therapy:  Heparin level 0.3-0.5 units/ml Monitor platelets by anticoagulation protocol: Yes   Plan:   Increase heparin infusion to 1150 units/hr. Will avoid boluses given bleeding   Recheck HL in 6 hours  Daily CBC/HL  Continue to monitor for s/s of bleeding   Onnie Boer, PharmD, BCIDP, AAHIVP, CPP Infectious Disease Pharmacist 10/19/2018 5:46 PM

## 2018-10-19 NOTE — Progress Notes (Signed)
Pt's head turned at this time without event with 1RT and 2RN present.

## 2018-10-19 NOTE — Progress Notes (Signed)
ANTICOAGULATION CONSULT NOTE - Follow Up Consult  Pharmacy Consult for Heparin Indication: pulmonary embolus  No Known Allergies  Patient Measurements: Height: 5\' 3"  (160 cm) Weight: 216 lb 14.9 oz (98.4 kg) IBW/kg (Calculated) : 56.9 Heparin Dosing Weight: 79 kg  Vital Signs: Temp: 99 F (37.2 C) (09/22 0800) Temp Source: Esophageal (09/22 0800) BP: 130/73 (09/22 0800) Pulse Rate: 79 (09/22 0800)  Labs: Recent Labs    10/17/18 0350 10/18/18 0200 10/18/18 0443 10/18/18 0834 10/18/18 1209 10/18/18 1631 10/18/18 2205 10/19/18 0430 10/19/18 0623 10/19/18 0930  HGB 11.9*  --  11.1* 11.6* 11.2*  --   --   --  11.6*  --   HCT 38.6*  --  36.8* 34.0* 33.0*  --   --   --  34.0*  --   PLT 232  --  171  --   --   --   --   --   --   --   APTT  --   --   --   --   --   --   --  61*  --   --   HEPARINUNFRC  --  <0.10*  --   --   --   --  0.29*  --   --  0.40  CREATININE 4.40*  --  4.58*  --   --  4.45*  --  2.94*  --   --     Estimated Creatinine Clearance: 27.8 mL/min (A) (by C-G formula based on SCr of 2.94 mg/dL (H)).   Assessment: Thomas Gill is a 63 y.o. male presenting on 9/14 with worsening O2 saturation, cough, and diarrhea. Patient was diagnosed with covid last week. On 9/17, patient decompensated requiring intubation. On 9/18 patient had a cardiorespiratory arrest/code blue event that lasted approximately 9 minutes until achieving ROSC.  Patient was not any PTA anticoagulation but was started on IV heparin for acute bilateral DVT's. Heparin was paused on 9/19 given oral bleeding which has resolved. Per MD, okay to resume IV heparin at 1800 on 9/20 . H/H low stable, Plt wnl   Today, 10/19/18  HL 0.40, therapetuic  Hgb 11.6, plt 171, low but stable ( 9/21)   No line or bleeding issues   Pt restarted on heparin on 9/21  Goal of Therapy:  Heparin level 0.3-0.5 units/ml Monitor platelets by anticoagulation protocol: Yes   Plan:   Continue heparin infusion to  1000 units/hr. Will avoid boluses given bleeding   Recheck HL in 6 hours  Daily CBC/HL  Continue to monitor for s/s of bleeding    Acey Lav, PharmD  PGY1 Acute Care Pharmacy Resident 10/19/2018 11:45 AM

## 2018-10-19 NOTE — Progress Notes (Signed)
Pt's niece, Silvano Bilis, called and given update on Mr Ducre's condition. All questions answered, and Ms Sheffield Slider the staff at [facility] for all they're doing for her uncle.

## 2018-10-19 NOTE — Progress Notes (Signed)
Proned patient with arms rotated and and head turned to the right. Tolerated well.

## 2018-10-20 DIAGNOSIS — N179 Acute kidney failure, unspecified: Secondary | ICD-10-CM | POA: Diagnosis not present

## 2018-10-20 DIAGNOSIS — J9601 Acute respiratory failure with hypoxia: Secondary | ICD-10-CM | POA: Diagnosis not present

## 2018-10-20 DIAGNOSIS — U071 COVID-19: Secondary | ICD-10-CM | POA: Diagnosis not present

## 2018-10-20 DIAGNOSIS — I82403 Acute embolism and thrombosis of unspecified deep veins of lower extremity, bilateral: Secondary | ICD-10-CM

## 2018-10-20 LAB — RENAL FUNCTION PANEL
Albumin: 1.8 g/dL — ABNORMAL LOW (ref 3.5–5.0)
Albumin: 1.9 g/dL — ABNORMAL LOW (ref 3.5–5.0)
Anion gap: 9 (ref 5–15)
Anion gap: 8 (ref 5–15)
BUN: 75 mg/dL — ABNORMAL HIGH (ref 6–20)
BUN: 72 mg/dL — ABNORMAL HIGH (ref 6–20)
CO2: 25 mmol/L (ref 22–32)
CO2: 24 mmol/L (ref 22–32)
Calcium: 8 mg/dL — ABNORMAL LOW (ref 8.9–10.3)
Calcium: 7.8 mg/dL — ABNORMAL LOW (ref 8.9–10.3)
Chloride: 103 mmol/L (ref 98–111)
Chloride: 102 mmol/L (ref 98–111)
Creatinine, Ser: 1.96 mg/dL — ABNORMAL HIGH (ref 0.61–1.24)
Creatinine, Ser: 2.15 mg/dL — ABNORMAL HIGH (ref 0.61–1.24)
GFR calc Af Amer: 42 mL/min — ABNORMAL LOW (ref >60)
GFR calc Af Amer: 37 mL/min — ABNORMAL LOW (ref >60)
GFR calc non Af Amer: 36 mL/min — ABNORMAL LOW (ref >60)
GFR calc non Af Amer: 32 mL/min — ABNORMAL LOW (ref >60)
Glucose, Bld: 312 mg/dL — ABNORMAL HIGH (ref 70–99)
Glucose, Bld: 259 mg/dL — ABNORMAL HIGH (ref 70–99)
Phosphorus: 3.7 mg/dL (ref 2.5–4.6)
Phosphorus: 4 mg/dL (ref 2.5–4.6)
Potassium: 5.2 mmol/L — ABNORMAL HIGH (ref 3.5–5.1)
Potassium: 5.3 mmol/L — ABNORMAL HIGH (ref 3.5–5.1)
Sodium: 137 mmol/L (ref 135–145)
Sodium: 134 mmol/L — ABNORMAL LOW (ref 135–145)

## 2018-10-20 LAB — POCT I-STAT 7, (LYTES, BLD GAS, ICA,H+H)
Acid-base deficit: 3 mmol/L — ABNORMAL HIGH (ref 0.0–2.0)
Acid-base deficit: 1 mmol/L (ref 0.0–2.0)
Bicarbonate: 26.6 mmol/L (ref 20.0–28.0)
Bicarbonate: 27.5 mmol/L (ref 20.0–28.0)
Calcium, Ion: 1.13 mmol/L — ABNORMAL LOW (ref 1.15–1.40)
Calcium, Ion: 1.16 mmol/L (ref 1.15–1.40)
HCT: 59 % — ABNORMAL HIGH (ref 39.0–52.0)
HCT: 37 % — ABNORMAL LOW (ref 39.0–52.0)
Hemoglobin: 20.1 g/dL — ABNORMAL HIGH (ref 13.0–17.0)
Hemoglobin: 12.6 g/dL — ABNORMAL LOW (ref 13.0–17.0)
O2 Saturation: 87 %
O2 Saturation: 66 %
Patient temperature: 36.8
Patient temperature: 37.9
Potassium: 4.9 mmol/L (ref 3.5–5.1)
Potassium: 5.5 mmol/L — ABNORMAL HIGH (ref 3.5–5.1)
Sodium: 139 mmol/L (ref 135–145)
Sodium: 136 mmol/L (ref 135–145)
TCO2: 29 mmol/L (ref 22–32)
TCO2: 29 mmol/L (ref 22–32)
pCO2 arterial: 64.3 mmHg — ABNORMAL HIGH (ref 32.0–48.0)
pCO2 arterial: 65.4 mmHg (ref 32.0–48.0)
pH, Arterial: 7.224 — ABNORMAL LOW (ref 7.350–7.450)
pH, Arterial: 7.238 — ABNORMAL LOW (ref 7.350–7.450)
pO2, Arterial: 64 mmHg — ABNORMAL LOW (ref 83.0–108.0)
pO2, Arterial: 44 mmHg — ABNORMAL LOW (ref 83.0–108.0)

## 2018-10-20 LAB — CBC
HCT: 37.4 % — ABNORMAL LOW (ref 39.0–52.0)
Hemoglobin: 11.3 g/dL — ABNORMAL LOW (ref 13.0–17.0)
MCH: 30.4 pg (ref 26.0–34.0)
MCHC: 30.2 g/dL (ref 30.0–36.0)
MCV: 100.5 fL — ABNORMAL HIGH (ref 80.0–100.0)
Platelets: 176 10*3/uL (ref 150–400)
RBC: 3.72 MIL/uL — ABNORMAL LOW (ref 4.22–5.81)
RDW: 14.7 % (ref 11.5–15.5)
WBC: 12.5 10*3/uL — ABNORMAL HIGH (ref 4.0–10.5)
nRBC: 1.5 % — ABNORMAL HIGH (ref 0.0–0.2)

## 2018-10-20 LAB — MAGNESIUM: Magnesium: 2.5 mg/dL — ABNORMAL HIGH (ref 1.7–2.4)

## 2018-10-20 LAB — GLUCOSE, CAPILLARY
Glucose-Capillary: 291 mg/dL — ABNORMAL HIGH (ref 70–99)
Glucose-Capillary: 268 mg/dL — ABNORMAL HIGH (ref 70–99)
Glucose-Capillary: 286 mg/dL — ABNORMAL HIGH (ref 70–99)
Glucose-Capillary: 305 mg/dL — ABNORMAL HIGH (ref 70–99)
Glucose-Capillary: 224 mg/dL — ABNORMAL HIGH (ref 70–99)
Glucose-Capillary: 266 mg/dL — ABNORMAL HIGH (ref 70–99)

## 2018-10-20 LAB — APTT: aPTT: 48 seconds — ABNORMAL HIGH (ref 24–36)

## 2018-10-20 LAB — HEPARIN LEVEL (UNFRACTIONATED)
Heparin Unfractionated: 0.17 IU/mL — ABNORMAL LOW (ref 0.30–0.70)
Heparin Unfractionated: 0.25 IU/mL — ABNORMAL LOW (ref 0.30–0.70)
Heparin Unfractionated: 0.26 IU/mL — ABNORMAL LOW (ref 0.30–0.70)

## 2018-10-20 MED ORDER — HEPARIN (PORCINE) 25000 UT/250ML-% IV SOLN
1300.0000 [IU]/h | INTRAVENOUS | Status: DC
  Administered 2018-10-20: 1300 [IU]/h via INTRAVENOUS

## 2018-10-20 MED ORDER — INSULIN DETEMIR 100 UNIT/ML ~~LOC~~ SOLN
25.0000 [IU] | Freq: Two times a day (BID) | SUBCUTANEOUS | Status: DC
  Administered 2018-10-20 – 2018-10-21 (×2): 25 [IU] via SUBCUTANEOUS
  Filled 2018-10-20 (×3): qty 0.25

## 2018-10-20 MED ORDER — HEPARIN (PORCINE) 25000 UT/250ML-% IV SOLN
1750.0000 [IU]/h | INTRAVENOUS | Status: DC
  Administered 2018-10-20: 1450 [IU]/h via INTRAVENOUS
  Administered 2018-10-21: 1500 [IU]/h via INTRAVENOUS
  Administered 2018-10-22 – 2018-10-23 (×3): 1450 [IU]/h via INTRAVENOUS
  Administered 2018-10-24: 1550 [IU]/h via INTRAVENOUS
  Administered 2018-10-25: 1600 [IU]/h via INTRAVENOUS
  Filled 2018-10-20 (×7): qty 250

## 2018-10-20 NOTE — Progress Notes (Signed)
[facility] NEPHROLOGY PROGRESS NOTE  Assessment/ Plan: Pt is a 63 y.o. yo male with a COVID-19 presented with shortness of breath, cough diarrhea.  He had cardiopulmonary arrest on 9/17 with ROSC 9 minutes.  He is intubated and now with worsening renal failure and hyperkalemia.  #Nonoliguric AKI likely ATN after cardiac arrest and COVID-19 infection. Started CRRT on 10/18/2018. I spoke with patient's sister [name] who is in [place].  I used [employer] interpreter # [phone] and discussed at length.  Verbal consent obtained for catheter placement and dialysis treatment.  Continue IV heparin for the anticoagulation.  Patient had UF of around 4.3 L overnight however is still positive by more than 10 L since admission.  I will increase the UF goal 250 cc an hour. Ultrasound kidneys with large kidneys without obstruction. -Left IJ temporary catheter placed by PCCM on 9/21.  #Hyperkalemia: Continue pre-and post filter to 0K bath and 4K bath and dialysate.  Also due to acidosis.  #COVID-19 pneumonia/ARDS/vent dependent respiratory failure: Very high FiO2 with poor oxygenation.  Per PCCM and primary team.  #Cardiac arrest on 9/17: 9 minutes ROSC.  #DVT: IV heparin, per pulmonary.  #Elevated LFTs, shock liver.  Subjective: Chart and lab results reviewed.  Discussed with Dr. Gloris Ham today.  Tolerating CRRT well.  No issue with catheter. Objective Vital signs in last 24 hours: Vitals:   10/20/18 0600 10/20/18 0700 10/20/18 0800 10/20/18 0900  BP: 95/66 98/67 110/78 109/64  Pulse: 67 (!) 52 (!) 48 (!) 47  Resp:      Temp: (!) 97 F (36.1 C) (!) 96.8 F (36 C) (!) 96.8 F (36 C) (!) 96.8 F (36 C)  TempSrc:   Core Core  SpO2: (!) 85% 97% 93% 93%  Weight:      Height:       Weight change: -3.145 kg  Intake/Output Summary (Last 24 hours) at 10/20/2018 1055 Last data filed at 10/20/2018 1000 Gross per 24 hour  Intake 2869.85 ml  Output 5055 ml  Net -2185.15 ml        Labs: Basic Metabolic Panel: Recent Labs  Lab 10/19/18 0430  10/19/18 1608 10/19/18 1640 10/19/18 1955 10/20/18 0456  NA 143   < > 139 140 140 137  139  K 5.4*   < > 5.2* 5.1 4.8 5.2*  4.9  CL 106  --  104  --   --  103  CO2 28  --  26  --   --  25  GLUCOSE 193*  --  209*  --   --  312*  BUN 98*  --  82*  --   --  75*  CREATININE 2.94*  --  2.24*  --   --  1.96*  CALCIUM 8.0*  --  7.8*  --   --  8.0*  PHOS 4.1  --  2.6  --   --  3.7   < > = values in this interval not displayed.   Liver Function Tests: Recent Labs  Lab 10/16/18 0445 10/17/18 0350 10/18/18 0443  10/19/18 0430 10/19/18 1608 10/20/18 0456  AST 56* 38 43*  --   --   --   --   ALT 69* 59* 47*  --   --   --   --   ALKPHOS 93 100 92  --   --   --   --   BILITOT 0.3 0.2* 0.5  --   --   --   --  PROT 5.2* 5.7* 5.8*  --   --   --   --   ALBUMIN 1.8* 1.9* 1.7*   < > 1.9* 1.7* 1.8*   < > = values in this interval not displayed.   No results for input(s): LIPASE, AMYLASE in the last 168 hours. No results for input(s): AMMONIA in the last 168 hours. CBC: Recent Labs  Lab 10/14/18 2325  10/15/18 0440  10/16/18 0445  10/17/18 0350 10/18/18 0443  10/19/18 1640 10/19/18 1955 10/20/18 0456  WBC 19.7*  --  20.7*  --  11.1*  --  13.4* 9.3  --   --   --  12.5*  NEUTROABS 12.7*  --   --   --   --   --   --   --   --   --   --   --   HGB 12.8*   < > 13.4   < > 11.7*   < > 11.9* 11.1*   < > 11.2* 11.9* 11.3*  20.1*  HCT 45.6   < > 45.3   < > 38.3*   < > 38.6* 36.8*   < > 33.0* 35.0* 37.4*  59.0*  MCV 109.4*  --  102.7*  --  100.5*  --  99.0 100.0  --   --   --  100.5*  PLT 209  --  192  --  193  --  232 171  --   --   --  176   < > = values in this interval not displayed.   Cardiac Enzymes: No results for input(s): CKTOTAL, CKMB, CKMBINDEX, TROPONINI in the last 168 hours. CBG: Recent Labs  Lab 10/19/18 1614 10/19/18 1940 10/19/18 2322 10/20/18 0348 10/20/18 0744  GLUCAP 176* 181* 178* 291*  268*    Iron Studies: No results for input(s): IRON, TIBC, TRANSFERRIN, FERRITIN in the last 72 hours. Studies/Results: US Renal  Result Date: 10/18/2018 CLINICAL DATA:  Acute kidney injury. EXAM: RENAL / URINARY TRACT ULTRASOUND COMPLETE COMPARISON:  None. FINDINGS: Right Kidney: Renal measurements: 13.1 x 6.0 x 6.5 cm = volume: 268 mL . Echogenicity within normal limits. No mass or hydronephrosis visualized. Left Kidney: Renal measurements: 15.3 x 6.7 x 6.0 cm = volume: 323 mL. Echogenicity within normal limits. No mass or hydronephrosis visualized. Bladder: Foley catheter in the bladder. IMPRESSION: Kidneys are slightly enlarged, as can be seen with acute nephritis. No sign of focal lesion or obstruction Electronically Signed   By: Nelson Chimes M.D.   On: 10/18/2018 14:22   Dg Chest Port 1 View  Result Date: 10/18/2018 CLINICAL DATA:  Central line placement. EXAM: PORTABLE CHEST 1 VIEW COMPARISON:  10/17/2018. FINDINGS: Endotracheal tube, NG tube, left IJ line stable position. Right IJ line placement with tip over superior vena cava. No pneumothorax. Cardiomegaly with bilateral pulmonary infiltrates/edema bilateral pleural effusions. Findings most consistent CHF. Findings have worsened from prior exam. IMPRESSION: 1. Interim placement right IJ line. Its tip is over the superior vena cava. No pneumothorax. Remaining lines and tubes in stable position. 2. Cardiomegaly with diffuse bilateral pulmonary infiltrates/edema bilateral pleural effusions. Findings most consistent with CHF. Electronically Signed   By: Marcello Moores  Register   On: 10/18/2018 12:33    Medications: Infusions: . sodium chloride 10 mL/hr at 10/20/18 1000  . ceFEPime (MAXIPIME) IV Stopped (10/20/18 0057)  . feeding supplement (VITAL 1.5 CAL) 1,000 mL (10/19/18 1730)  . fentaNYL infusion INTRAVENOUS 200 mcg/hr (10/20/18 1000)  . heparin 1,300 Units/hr (10/20/18  1000)  . midazolam 4 mg/hr (10/20/18 1000)  . norepinephrine (LEVOPHED)  Adult infusion Stopped (10/16/18 0111)  . prismasol BGK 0/2.5 300 mL/hr at 10/20/18 0539  . prismasol BGK 0/2.5 400 mL/hr at 10/20/18 0443  . prismasol BGK 4/2.5 1,500 mL/hr at 10/20/18 0916  . vancomycin Stopped (10/19/18 1834)    Scheduled Medications: . artificial tears  1 application Both Eyes G5Q  . chlorhexidine gluconate (MEDLINE KIT)  15 mL Mouth Rinse BID  . Chlorhexidine Gluconate Cloth  6 each Topical Daily  . dexamethasone (DECADRON) injection  6 mg Intravenous Q12H  . famotidine  20 mg Per Tube QHS  . feeding supplement (PRO-STAT SUGAR FREE 64)  60 mL Per Tube TID  . insulin aspart  0-20 Units Subcutaneous Q4H  . insulin aspart  3 Units Subcutaneous Q4H  . insulin detemir  20 Units Subcutaneous BID  . mouth rinse  15 mL Mouth Rinse 10 times per day  . multivitamin with minerals  1 tablet Per Tube Daily  . polyethylene glycol  17 g Per Tube BID  . sennosides  5 mL Per Tube BID  . sodium chloride flush  10-40 mL Intracatheter Q12H  . vancomycin variable dose per unstable renal function (pharmacist dosing)   Does not apply See admin instructions    have reviewed scheduled and prn medications.  Dron Prasad Bhandari 10/20/2018,10:55 AM  LOS: 9 days  Pager: [phone]

## 2018-10-20 NOTE — Plan of Care (Signed)
Pt is intubated and sedated, unable to participate in POC.

## 2018-10-20 NOTE — Progress Notes (Signed)
ANTICOAGULATION CONSULT NOTE - Follow Up Consult  Pharmacy Consult for Heparin Indication: Suspected PE, Bilateral DVTs, CRRT anticoagulation  No Known Allergies  Patient Measurements: Height: 5\' 3"  (160 cm) Weight: 210 lb (95.3 kg) IBW/kg (Calculated) : 56.9 Heparin Dosing Weight: 79.5 kg  Vital Signs: Temp: 99.7 F (37.6 C) (09/23 2200) Temp Source: Core (09/23 2000) BP: 88/62 (09/23 2200) Pulse Rate: 61 (09/23 2200)  Labs: Recent Labs    10/18/18 0443  10/19/18 0430  10/19/18 1608  10/19/18 1955 10/20/18 0000 10/20/18 0456 10/20/18 1010 10/20/18 1548 10/20/18 1736 10/20/18 2015  HGB 11.1*   < >  --    < >  --    < > 11.9*  --  11.3*  20.1*  --   --  12.6*  --   HCT 36.8*   < >  --    < >  --    < > 35.0*  --  37.4*  59.0*  --   --  37.0*  --   PLT 171  --   --   --   --   --   --   --  176  --   --   --   --   APTT  --   --  61*  --   --   --   --   --  48*  --   --   --   --   HEPARINUNFRC  --    < >  --    < > 0.16*  --   --  0.17*  --  0.25*  --   --  0.26*  CREATININE 4.58*   < > 2.94*  --  2.24*  --   --   --  1.96*  --  2.15*  --   --    < > = values in this interval not displayed.    Estimated Creatinine Clearance: 37.4 mL/min (A) (by C-G formula based on SCr of 2.15 mg/dL (H)).   Medications:  Infusions:  . sodium chloride 10 mL/hr at 10/20/18 1900  . ceFEPime (MAXIPIME) IV Stopped (10/20/18 1320)  . feeding supplement (VITAL 1.5 CAL) 1,000 mL (10/19/18 1730)  . fentaNYL infusion INTRAVENOUS 300 mcg/hr (10/20/18 1955)  . heparin 1,450 Units/hr (10/20/18 1900)  . midazolam 6 mg/hr (10/20/18 1900)  . norepinephrine (LEVOPHED) Adult infusion Stopped (10/16/18 0111)  . prismasol BGK 0/2.5 300 mL/hr at 10/20/18 0539  . prismasol BGK 0/2.5 400 mL/hr at 10/20/18 1928  . prismasol BGK 4/2.5 1,500 mL/hr at 10/20/18 2126    Assessment: Thomas Gill is a 63 y.o. male presented on 9/14 with worsening O2 saturation, cough, diarrhea, COVID-19 positive.  On  9/17, patient decompensated requiring intubation. On 9/18 patient had acardiorespiratory arrest/code blueevent that lasted approximately 9 minutes until achieving ROSC.  9/18 Vascular 10/18 positive for bilateral DVTs.  Pharmacy is consulted to dose heparin for DVT and suspected PE.  Heparin was paused on 9/19 given oral bleeding which has resolved. Per MD, okay to resume IV heparin at 1800 on 9/20.  Today, 10/20/2018:  Heparin level remains slightly low at 0.26 on heparin at 1450 units/hr  CBC stable:  Hgb 12.6, Plt 176  Per RN no line or bleeding issues with pt's heparin.   Continues on CRRT.  Heparin in CRRT circuit was stopped on 9/22 when systemic heparin reached therapeutic levels.  Despite several sub-therapeutic systemic heparin levels, the RN confirms no CRRT clotting or increased filter  pressures.    I would expect heparin level to continue to slowly increase to goal. Will just slightly increase heparin gtt rate.    Goal of Therapy:  Heparin level 0.3-0.5 units/ml Monitor platelets by anticoagulation protocol: Yes   Plan:  Increase heparin gtt slightly to 1,500 units/hr Monitor daily heparin level, CBC, s/s of bleed   Elenor Quinones, PharmD, BCPS, BCIDP Clinical Pharmacist 10/20/2018 10:08 PM

## 2018-10-20 NOTE — Progress Notes (Signed)
Patient remains proned. Head repositoned. ETT remains secured. No complications. RTx2, RNx2 @ bedside.

## 2018-10-20 NOTE — Procedures (Signed)
Hemodialysis Catheter Insertion Procedure Note [facility] Gill [id number] 11-03-1955  Procedure: Insertion of Central Venous Catheter Indications: Hemodialysis  Procedure Details Consent: Unable to obtain consent because of emergent medical necessity. Time Out: Verified patient identification, verified procedure, site/side was marked, verified correct patient position, special equipment/implants available, medications/allergies/relevent history reviewed, required imaging and test results available.  Performed  Maximum sterile technique was used including antiseptics, cap, gloves, gown, hand hygiene, mask and sheet. Skin prep: Chlorhexidine; local anesthetic administered A antimicrobial bonded/coated triple lumen catheter was placed in the left femoral vein due to patient being a dialysis patient using the Seldinger technique.  Ultrasound was used to verify the patency of the vein and for real time needle guidance.  Evaluation Blood flow good Complications: No apparent complications Patient did tolerate procedure well. Chest X-ray ordered to verify placement.  CXR: not indicated.  Thomas Gill 10/20/2018, 4:04 PM

## 2018-10-20 NOTE — Progress Notes (Signed)
[facility] Evening rounds  Severe hypoxemia, paO2 44 Increase PEEP 14, FiO2 100% Continue volume removal  Roselie Awkward, MD [place] [facility] Pager: [phone] Cell: [phone] If no response, call [phone] ]

## 2018-10-20 NOTE — Progress Notes (Signed)
Patient is prone and head turned to the right.  Noticeable drainage from mouth and edema around eyes.Arm positition also rotated.

## 2018-10-20 NOTE — Progress Notes (Signed)
PROGRESS NOTE  Thomas Gill VHQ:[id number] DOB: [birth year] DOA: 10/14/2018 PCP: Patient, No Pcp Per   LOS: 9 days   Brief Narrative / Interim history: Thomas Gill is an 63 y.o. male diagnosed with COVID-19 last week test on 10/06/2018 was positive, presents to the ED with progressive shortness of breath, cough and diarrhea.  When EMS arrived at his house he was satting in the 60s on room air.  Patient was admitted to the ICU and intubated within 24 hours of arrival. Hospital course complicated by cardiopulmonary arrest on 9/17 followed by AKI now requiring CRRT   Significant events: 9/14 admitted 9/15 intubated 9/17 cardiopulmonary arrest, asystole, ROSC in 9 minutes per chart 9/17 femoral line 9/18 DVT found, started on heparin 9/20 DC femoral line, insert left IJ 9/20 bronchoscopy for mucous plugging 9/21 hemodialysis catheter placement, start CRRT  Subjective/interval events: -sedated, on vent   Assessment & Plan: Active Problems:   COVID-19 virus infection   Acute respiratory failure with hypoxia (HCC)   AKI (acute kidney injury) ([place])   Collapse of left lung  Principal Problem ARDS / Acute hypoxic respiratory failure due to Covid 19 pneumonia -mechanical ventilation per ARDS protocol -target TVol 6-8cc/kgIBW, target Plateau Pressure < 30cm H20, target driving pressure less than 15 cm of water and target PaO2 55-65: titrate PEEP/FiO2 per protocol -if PaO2 to FiO2 ratio is less than 1:150 position in prone position for 16 hours a day -daily CVP if CVL in place, target CVP less than 4, diurese as necessary -VAP prevention protocol -Respiratory status remains tenuous, fluid overload plays a role.  On CRRT, still remains significantly fluid overloaded, +9.8L  Vent Mode: PRVC FiO2 (%):  [70 %-100 %] 80 % Set Rate:  [34 bmp-35 bmp] 35 bmp Vt Set:  [390 mL-450 mL] 450 mL PEEP:  [12 cmH20] 12 cmH20 Plateau Pressure:  [23 UXL24-40 cmH20] 37 cmH20 -Completed Remdesivir on 9/18  -Continue Decadron  COVID-19 Labs  Recent Labs    10/18/18 0443  CRP 31.1*    Lab Results  Component Value Date   SARSCOV2NAA POSITIVE (A) 10/18/2018   Active Problems Cardiac arrest/asystole -On 9/17, ROSC in 9 minutes, epi x3, 2 A of bicarb, short run of A. fib with RVR then normal sinus rhythm -Cardiac arrest possibly caused by PE, as lower extremity Dopplers showed DVTs bilaterally  Hemoptisis -Possibly related to PE/as patient is on anticoagulation, also significant mucus plugging as above.  Continue to closely monitor -No further significant hemoptysis, continue heparin  Acute DVT -Lower extremity ultrasound obtained on 10/15/2018 showed age-indeterminate DVTs bilaterally.  Have to assume these are acute possibly causing a PE which led to his cardiac arrest.  Unable to obtain a CT angiogram due to renal failure -on heparin   Acute kidney injury -This is following cardiac arrest.  -still significant fluid overloaded -On CRRT, remove fluid as able. D/w Dr. Carolin Sicks with nephrology today   Hyperkalemia -Due to renal failure, status post Lokelma x 3 total.  -now on CRRT   Elevated LFTs, shock liver -Shock liver following cardiac arrest.  LFTs improved  DM2 with hyperglycemia -continue insulin, hold home agents, CBGs as below.  Continue current regimen every 4 hours   CBG (last 3)  Recent Labs    10/20/18 0348 10/20/18 0744 10/20/18 1158  GLUCAP 291* 268* 286*    Scheduled Meds: . artificial tears  1 application Both Eyes N0U  . chlorhexidine gluconate (MEDLINE KIT)  15 mL Mouth Rinse BID  . Chlorhexidine  Gluconate Cloth  6 each Topical Daily  . dexamethasone (DECADRON) injection  6 mg Intravenous Q12H  . famotidine  20 mg Per Tube QHS  . feeding supplement (PRO-STAT SUGAR FREE 64)  60 mL Per Tube TID  . insulin aspart  0-20 Units Subcutaneous Q4H  . insulin aspart  3 Units Subcutaneous Q4H  . insulin detemir  20 Units Subcutaneous BID  . mouth rinse  15  mL Mouth Rinse 10 times per day  . multivitamin with minerals  1 tablet Per Tube Daily  . polyethylene glycol  17 g Per Tube BID  . sennosides  5 mL Per Tube BID  . sodium chloride flush  10-40 mL Intracatheter Q12H   Continuous Infusions: . sodium chloride Stopped (10/20/18 1250)  . ceFEPime (MAXIPIME) IV 200 mL/hr at 10/20/18 1300  . feeding supplement (VITAL 1.5 CAL) 1,000 mL (10/19/18 1730)  . fentaNYL infusion INTRAVENOUS 200 mcg/hr (10/20/18 1300)  . heparin 1,450 Units/hr (10/20/18 1300)  . midazolam 4 mg/hr (10/20/18 1300)  . norepinephrine (LEVOPHED) Adult infusion Stopped (10/16/18 0111)  . prismasol BGK 0/2.5 300 mL/hr at 10/20/18 0539  . prismasol BGK 0/2.5 400 mL/hr at 10/20/18 0443  . prismasol BGK 4/2.5 1,500 mL/hr at 10/20/18 1243   PRN Meds:.  DVT prophylaxis: Lovenox Code Status: Full code Family Communication: Family discussion per PCCM Disposition Plan: remain in ICU  Consultants:   PCCM  Antimicrobials:  None    Objective: Vitals:   10/20/18 1140 10/20/18 1144 10/20/18 1200 10/20/18 1300  BP: 110/69  104/68   Pulse: (!) 55  (!) 57   Resp: (!) 35     Temp:   (!) 97.3 F (36.3 C)   TempSrc:  Core Core Core  SpO2: (!) 87%  (!) 88%   Weight:      Height:        Intake/Output Summary (Last 24 hours) at 10/20/2018 1351 Last data filed at 10/20/2018 1300 Gross per 24 hour  Intake 2958.93 ml  Output 5127 ml  Net -2168.07 ml   Filed Weights   10/18/18 0600 10/19/18 0500 10/20/18 0500  Weight: 102 kg 98.4 kg 95.3 kg    Examination:  Constitutional: sedated, on vent  ENMT: ETT in place Neck: normal, supple Respiratory: coarse bilaterally, diminished at the bases, no crackles, no wheezing  Cardiovascular: RRR, no murmurs. Mild anasarca Abdomen: soft, non distended, positive bowel sounds  Musculoskeletal: no clubbing / cyanosis  Skin: no rashes seen Neurologic: sedated  Data Reviewed: I have independently reviewed following labs and imaging  studies   CBC: Recent Labs  Lab 10/14/18 2325  10/15/18 0440  10/16/18 0445  10/17/18 0350 10/18/18 0443  10/18/18 1209 10/19/18 0623 10/19/18 1640 10/19/18 1955 10/20/18 0456  WBC 19.7*  --  20.7*  --  11.1*  --  13.4* 9.3  --   --   --   --   --  12.5*  NEUTROABS 12.7*  --   --   --   --   --   --   --   --   --   --   --   --   --   HGB 12.8*   < > 13.4   < > 11.7*   < > 11.9* 11.1*   < > 11.2* 11.6* 11.2* 11.9* 11.3*  20.1*  HCT 45.6   < > 45.3   < > 38.3*   < > 38.6* 36.8*   < > 33.0* 34.0* 33.0*  35.0* 37.4*  59.0*  MCV 109.4*  --  102.7*  --  100.5*  --  99.0 100.0  --   --   --   --   --  100.5*  PLT 209  --  192  --  193  --  232 171  --   --   --   --   --  176   < > = values in this interval not displayed.   Basic Metabolic Panel: Recent Labs  Lab 10/16/18 1650 10/17/18 0350 10/18/18 0443  10/18/18 1631 10/19/18 0430 10/19/18 0623 10/19/18 1608 10/19/18 1640 10/19/18 1955 10/20/18 0456  NA  --  143 144   < > 144 143 143 139 140 140 137  139  K  --  5.6* 6.0*   < > 5.7* 5.4* 5.4* 5.2* 5.1 4.8 5.2*  4.9  CL  --  104 106  --  110 106  --  104  --   --  103  CO2  --  29 29  --  26 28  --  26  --   --  25  GLUCOSE  --  267* 365*  --  304* 193*  --  209*  --   --  312*  BUN  --  108* 144*  --  136* 98*  --  82*  --   --  75*  CREATININE  --  4.40* 4.58*  --  4.45* 2.94*  --  2.24*  --   --  1.96*  CALCIUM  --  7.9* 7.8*  --  7.7* 8.0*  --  7.8*  --   --  8.0*  MG 2.5* 2.5* 2.5*  --   --  2.6*  --   --   --   --  2.5*  PHOS 5.2* 4.6 3.7  --  3.9 4.1  --  2.6  --   --  3.7   < > = values in this interval not displayed.   GFR: Estimated Creatinine Clearance: 41 mL/min (A) (by C-G formula based on SCr of 1.96 mg/dL (H)). Liver Function Tests: Recent Labs  Lab 10/14/18 0435 10/15/18 0440 10/16/18 0445 10/17/18 0350 10/18/18 0443 10/18/18 1631 10/19/18 0430 10/19/18 1608 10/20/18 0456  AST 60* 139* 56* 38 43*  --   --   --   --   ALT 64* 101* 69* 59*  47*  --   --   --   --   ALKPHOS 108 122 93 100 92  --   --   --   --   BILITOT 0.6 0.8 0.3 0.2* 0.5  --   --   --   --   PROT 6.5 5.7* 5.2* 5.7* 5.8*  --   --   --   --   ALBUMIN 2.5* 2.1* 1.8* 1.9* 1.7* 1.7* 1.9* 1.7* 1.8*   No results for input(s): LIPASE, AMYLASE in the last 168 hours. No results for input(s): AMMONIA in the last 168 hours. Coagulation Profile: No results for input(s): INR, PROTIME in the last 168 hours. Cardiac Enzymes: No results for input(s): CKTOTAL, CKMB, CKMBINDEX, TROPONINI in the last 168 hours. BNP (last 3 results) No results for input(s): PROBNP in the last 8760 hours. HbA1C: No results for input(s): HGBA1C in the last 72 hours. CBG: Recent Labs  Lab 10/19/18 1940 10/19/18 2322 10/20/18 0348 10/20/18 0744 10/20/18 1158  GLUCAP 181* 178* 291* 268* 286*   Lipid Profile: No results for  input(s): CHOL, HDL, LDLCALC, TRIG, CHOLHDL, LDLDIRECT in the last 72 hours. Thyroid Function Tests: No results for input(s): TSH, T4TOTAL, FREET4, T3FREE, THYROIDAB in the last 72 hours. Anemia Panel: No results for input(s): VITAMINB12, FOLATE, FERRITIN, TIBC, IRON, RETICCTPCT in the last 72 hours. Urine analysis:    Component Value Date/Time   COLORURINE YELLOW 10/17/2018 0215   APPEARANCEUR HAZY (A) 10/17/2018 0215   LABSPEC 1.013 10/17/2018 0215   PHURINE 5.0 10/17/2018 0215   GLUCOSEU 150 (A) 10/17/2018 0215   HGBUR SMALL (A) 10/17/2018 0215   HGBUR negative 12/22/2007 0826   BILIRUBINUR NEGATIVE 10/17/2018 0215   BILIRUBINUR negative 12/12/2014 1241   KETONESUR NEGATIVE 10/17/2018 0215   PROTEINUR NEGATIVE 10/17/2018 0215   UROBILINOGEN 0.2 12/12/2014 1241   UROBILINOGEN 0.2 12/22/2007 0826   NITRITE NEGATIVE 10/17/2018 0215   LEUKOCYTESUR MODERATE (A) 10/17/2018 0215   Sepsis Labs: Invalid input(s): PROCALCITONIN, LACTICIDVEN  Recent Results (from the past 240 hour(s))  SARS Coronavirus 2 [facility] order, Performed in [facility] hospital lab)  Nasopharyngeal Nasopharyngeal Swab     Status: Abnormal   Collection Time: 10/01/2018 11:30 AM   Specimen: Nasopharyngeal Swab  Result Value Ref Range Status   SARS Coronavirus 2 POSITIVE (A) NEGATIVE Final    Comment: RESULT CALLED TO, READ BACK BY AND VERIFIED WITH: RN H HARDY AT 1304 09/29/2018 BY L BENFIELD (NOTE) If result is NEGATIVE SARS-CoV-2 target nucleic acids are NOT DETECTED. The SARS-CoV-2 RNA is generally detectable in upper and lower  respiratory specimens during the acute phase of infection. The lowest  concentration of SARS-CoV-2 viral copies this assay can detect is 250  copies / mL. A negative result does not preclude SARS-CoV-2 infection  and should not be used as the sole basis for treatment or other  patient management decisions.  A negative result may occur with  improper specimen collection / handling, submission of specimen other  than nasopharyngeal swab, presence of viral mutation(s) within the  areas targeted by this assay, and inadequate number of viral copies  (<250 copies / mL). A negative result must be combined with clinical  observations, patient history, and epidemiological information. If result is POSITIVE SARS-CoV-2 target nucleic acids are DETEC TED. The SARS-CoV-2 RNA is generally detectable in upper and lower  respiratory specimens during the acute phase of infection.  Positive  results are indicative of active infection with SARS-CoV-2.  Clinical  correlation with patient history and other diagnostic information is  necessary to determine patient infection status.  Positive results do  not rule out bacterial infection or co-infection with other viruses. If result is PRESUMPTIVE POSTIVE SARS-CoV-2 nucleic acids MAY BE PRESENT.   A presumptive positive result was obtained on the submitted specimen  and confirmed on repeat testing.  While 2019 novel coronavirus  (SARS-CoV-2) nucleic acids may be present in the submitted sample  additional  confirmatory testing may be necessary for epidemiological  and / or clinical management purposes  to differentiate between  SARS-CoV-2 and other Sarbecovirus currently known to infect humans.  If clinically indicated additional testing with an alternate test  methodology (LAB7 [id number]) is advised. The SARS-CoV-2 RNA is generally  detectable in upper and lower respiratory specimens during the acute  phase of infection. The expected result is Negative. Fact Sheet for Patients:  StrictlyIdeas.no Fact Sheet for Healthcare Providers: BankingDealers.co.za This test is not yet approved or cleared by the [place] FDA and has been authorized for detection and/or diagnosis of SARS-CoV-2 by FDA under an  Emergency Use Authorization (EUA).  This EUA will remain in effect (meaning this test can be used) for the duration of the COVID-19 declaration under Section 564(b)(1) of the Act, 21 U.S.C. section 360bbb-3(b)(1), unless the authorization is terminated or revoked sooner. Performed at [facility], [place] [address], [place], [place] [address]   MRSA PCR Screening     Status: None   Collection Time: 10/12/18  9:05 PM   Specimen: Nasal Mucosa; Nasopharyngeal  Result Value Ref Range Status   MRSA by PCR NEGATIVE NEGATIVE Final    Comment:        The GeneXpert MRSA Assay (FDA approved for NASAL specimens only), is one component of a comprehensive MRSA colonization surveillance program. It is not intended to diagnose MRSA infection nor to guide or monitor treatment for MRSA infections. Performed at [facility], [place] [address], [place], [place] [address]   Culture, bal-quantitative     Status: Abnormal (Preliminary result)   Collection Time: 10/17/18 11:37 AM   Specimen: Bronchoalveolar Lavage; Respiratory  Result Value Ref Range Status   Specimen Description   Final    BRONCHIAL ALVEOLAR LAVAGE Performed at [facility] [address] [place], [place] [address]    Special Requests   Final    NONE Performed at [facility], [place] [address] [place], [place] [address]    Gram Stain   Final    FEW WBC PRESENT, PREDOMINANTLY MONONUCLEAR MODERATE GRAM POSITIVE COCCI FEW GRAM NEGATIVE COCCI RARE GRAM NEGATIVE RODS    Culture (A)  Final    >=100,000 COLONIES/mL PROTEUS MIRABILIS CULTURE REINCUBATED FOR BETTER GROWTH Performed at [facility], [place] [address], [place], [place] [address]    Report Status PENDING  Incomplete   Organism ID, Bacteria PROTEUS MIRABILIS (A)  Final      Susceptibility   Proteus mirabilis - MIC*    AMPICILLIN <=2 SENSITIVE Sensitive     CEFAZOLIN <=4 SENSITIVE Sensitive     CEFEPIME <=1 SENSITIVE Sensitive     CEFTAZIDIME <=1 SENSITIVE Sensitive     CEFTRIAXONE <=1 SENSITIVE Sensitive     CIPROFLOXACIN <=0.25 SENSITIVE Sensitive     GENTAMICIN <=1 SENSITIVE Sensitive     IMIPENEM 1 SENSITIVE Sensitive     TRIMETH/SULFA <=20 SENSITIVE Sensitive     AMPICILLIN/SULBACTAM <=2 SENSITIVE Sensitive     PIP/TAZO <=4 SENSITIVE Sensitive     * >=100,000 COLONIES/mL PROTEUS MIRABILIS  Acid Fast Smear (AFB)     Status: None   Collection Time: 10/17/18 11:38 AM   Specimen: Bronchial Alveolar Lavage; Respiratory  Result Value Ref Range Status   AFB Specimen Processing Concentration  Final   Acid Fast Smear Negative  Final    Comment: (NOTE) Performed At: [facility] [place], [place] [id number] Rush Farmer MD GY:[id number]    Source (AFB) BRONCHIAL ALVEOLAR LAVAGE  Final    Comment: Performed at [facility] [address] [place], [place] [address]  MRSA PCR Screening     Status: None   Collection Time: 10/18/18 11:48 AM   Specimen: Nasal Mucosa; Nasopharyngeal  Result Value Ref Range Status   MRSA by PCR NEGATIVE NEGATIVE Final    Comment:        The GeneXpert MRSA Assay (FDA  approved for NASAL specimens only), is one component of a comprehensive MRSA colonization surveillance program. It is not intended to diagnose MRSA infection nor to guide or monitor  treatment for MRSA infections. Performed at [facility], [place] [address], [place], [place] [address]      Radiology Studies: US Renal  Result Date: 10/18/2018 CLINICAL DATA:  Acute kidney injury. EXAM: RENAL / URINARY TRACT ULTRASOUND COMPLETE COMPARISON:  None. FINDINGS: Right Kidney: Renal measurements: 13.1 x 6.0 x 6.5 cm = volume: 268 mL . Echogenicity within normal limits. No mass or hydronephrosis visualized. Left Kidney: Renal measurements: 15.3 x 6.7 x 6.0 cm = volume: 323 mL. Echogenicity within normal limits. No mass or hydronephrosis visualized. Bladder: Foley catheter in the bladder. IMPRESSION: Kidneys are slightly enlarged, as can be seen with acute nephritis. No sign of focal lesion or obstruction Electronically Signed   By: Nelson Chimes M.D.   On: 10/18/2018 14:22    The patient is critically ill with multiple organ system failure and requires high complexity decision making for assessment and support, frequent evaluation and titration of therapies, advanced monitoring, review of radiographic studies and interpretation of complex data.    Critical Care Time devoted to patient care services, exclusive of separately billable procedures, described in this note is 35 minutes.    Marzetta Board, MD, PhD [facility]  Contact via  [email or web address]  [place] P: [phone] F: [phone]   Family communication: William Hamburger [facility]) [phone] Auburn Hert (Brother) [phone] Keysean Savino (Sister) [phone] Dwana Curd [facility]) [phone] (English fluent)

## 2018-10-20 NOTE — Progress Notes (Signed)
Removed tape and adhesive dressings to face and ET tube.  Resecured with [place] and tube moved to left of mouth.  No skin breakdown noted on cheeks, lips, mouth or chin.

## 2018-10-20 NOTE — Progress Notes (Signed)
Head turned while patient proned to left side.  Tolerated well. Drainage from mouth still noted.

## 2018-10-20 NOTE — Procedures (Addendum)
Cortrak  Person Inserting Tube:  Thomas Gill, RD Tube Type:  Cortrak - 43 inches Tube Location:  Left nare Initial Placement:  Postpyloric Secured by: Bridle Technique Used to Measure Tube Placement:  Documented cm marking at nare/ corner of mouth Cortrak Secured At:  84 cm    Cortrak Tube Team Note:  Consult received to place a Cortrak feeding tube.  Pt just turned supine. OG tube remains in place and taped to his ETT. RT not available to remove at this time.   No x-ray is required. RN may begin using tube.   If the tube becomes dislodged please keep the tube and contact the Cortrak team at [email or web address] (password [email or web address]) for replacement.  If after hours and replacement cannot be delayed, place a NG tube and confirm placement with an abdominal x-ray.    [place], [place], [place] Pager [phone] After Hours Pager

## 2018-10-20 NOTE — Progress Notes (Signed)
Patient remained proned. Head repositioned. RN x2, RT x2 at bedside. ETT remains secured.

## 2018-10-20 NOTE — Progress Notes (Signed)
Patient remains proned.  Head repositioned. ETT secured. RTx2, RNx2 at bedside. No complications.

## 2018-10-20 NOTE — Progress Notes (Addendum)
ANTICOAGULATION CONSULT NOTE - Follow Up Consult  Pharmacy Consult for Heparin Indication: Suspected PE, Bilateral DVTs, CRRT anticoagulation  No Known Allergies  Patient Measurements: Height: 5\' 3"  (160 cm) Weight: 210 lb (95.3 kg) IBW/kg (Calculated) : 56.9 Heparin Dosing Weight: 79.5 kg  Vital Signs: Temp: 97.3 F (36.3 C) (09/23 1200) Temp Source: Core (09/23 1300) BP: 104/68 (09/23 1200) Pulse Rate: 57 (09/23 1200)  Labs: Recent Labs    10/18/18 0443  10/19/18 0430  10/19/18 1608 10/19/18 1640 10/19/18 1955 10/20/18 0000 10/20/18 0456 10/20/18 1010  HGB 11.1*   < >  --    < >  --  11.2* 11.9*  --  11.3*  20.1*  --   HCT 36.8*   < >  --    < >  --  33.0* 35.0*  --  37.4*  59.0*  --   PLT 171  --   --   --   --   --   --   --  176  --   APTT  --   --  61*  --   --   --   --   --  48*  --   HEPARINUNFRC  --    < >  --    < > 0.16*  --   --  0.17*  --  0.25*  CREATININE 4.58*   < > 2.94*  --  2.24*  --   --   --  1.96*  --    < > = values in this interval not displayed.    Estimated Creatinine Clearance: 41 mL/min (A) (by C-G formula based on SCr of 1.96 mg/dL (H)).   Medications:  Infusions:  . sodium chloride 10 mL/hr at 10/20/18 1400  . ceFEPime (MAXIPIME) IV Stopped (10/20/18 1320)  . feeding supplement (VITAL 1.5 CAL) 1,000 mL (10/19/18 1730)  . fentaNYL infusion INTRAVENOUS 200 mcg/hr (10/20/18 1400)  . heparin 1,450 Units/hr (10/20/18 1400)  . midazolam 4 mg/hr (10/20/18 1400)  . norepinephrine (LEVOPHED) Adult infusion Stopped (10/16/18 0111)  . prismasol BGK 0/2.5 300 mL/hr at 10/20/18 0539  . prismasol BGK 0/2.5 400 mL/hr at 10/20/18 0443  . prismasol BGK 4/2.5 1,500 mL/hr at 10/20/18 1243    Assessment: Thomas Gill is a 63 y.o. male presented on 9/14 with worsening O2 saturation, cough, diarrhea, COVID-19 positive.  On 9/17, patient decompensated requiring intubation. On 9/18 patient had acardiorespiratory arrest/code blueevent that lasted  approximately 9 minutes until achieving ROSC.  9/18 Vascular 10/18 positive for bilateral DVTs.  Pharmacy is consulted to dose heparin for DVT and suspected PE.  Heparin was paused on 9/19 given oral bleeding which has resolved. Per MD, okay to resume IV heparin at 1800 on 9/20.  Today, 10/20/2018:  Heparin level low but increased to 0.25 on heparin at 1300 units/hr  CBC stable:  Hgb 11.3, Plt 176  Per RN no line or bleeding issues with pt's heparin.   Continues on CRRT.  Heparin in CRRT circuit was stopped on 9/22 when systemic heparin reached therapeutic levels.  Despite several sub-therapeutic systemic heparin levels, the RN confirms no CRRT clotting or increased filter pressures.       Goal of Therapy:  Heparin level 0.3-0.5 units/ml Monitor platelets by anticoagulation protocol: Yes   Plan:   No heparin bolus d/t recent bleeding.  Increase to heparin IV infusion at 1450 units/hr  Heparin level 8 hours after rate change  Daily heparin level and CBC  Continue  to monitor for s/s of bleeding or clotting.   Gretta Arab PharmD, BCPS Clinical pharmacist phone 7am- 5pm: [phone] 10/20/2018 1:31 PM

## 2018-10-20 NOTE — Progress Notes (Signed)
ANTICOAGULATION CONSULT NOTE - Follow Up Consult  Pharmacy Consult for Heparin Indication: pulmonary embolus  No Known Allergies  Patient Measurements: Height: 5\' 3"  (160 cm) Weight: 216 lb 14.9 oz (98.4 kg) IBW/kg (Calculated) : 56.9 Heparin Dosing Weight: 79 kg  Vital Signs: Temp: 98.4 F (36.9 C) (09/23 0100) Temp Source: Esophageal (09/22 2000) BP: 121/71 (09/23 0100) Pulse Rate: 63 (09/23 0100)  Labs: Recent Labs    10/17/18 0350  10/18/18 0443  10/18/18 1631  10/19/18 0430 10/19/18 0623 10/19/18 0930 10/19/18 1608 10/19/18 1640 10/19/18 1955 10/20/18 0000  HGB 11.9*  --  11.1*   < >  --   --   --  11.6*  --   --  11.2* 11.9*  --   HCT 38.6*  --  36.8*   < >  --   --   --  34.0*  --   --  33.0* 35.0*  --   PLT 232  --  171  --   --   --   --   --   --   --   --   --   --   APTT  --   --   --   --   --   --  61*  --   --   --   --   --   --   HEPARINUNFRC  --    < >  --   --   --    < >  --   --  0.40 0.16*  --   --  0.17*  CREATININE 4.40*  --  4.58*  --  4.45*  --  2.94*  --   --  2.24*  --   --   --    < > = values in this interval not displayed.    Estimated Creatinine Clearance: 36.5 mL/min (A) (by C-G formula based on SCr of 2.24 mg/dL (H)).   Assessment: Thomas Gill is a 63 y.o. male presenting on 9/14 with worsening O2 saturation, cough, and diarrhea. Patient was diagnosed with covid last week. On 9/17, patient decompensated requiring intubation. On 9/18 patient had a cardiorespiratory arrest/code blue event that lasted approximately 9 minutes until achieving ROSC.  Patient was not any PTA anticoagulation but was started on IV heparin for acute bilateral DVT's. Heparin was paused on 9/19 given oral bleeding which has resolved. Per MD, okay to resume IV heparin at 1800 on 9/20 . H/H low stable, Plt wnl   Heparin level this PM is subtherapeutic at 0.17 . Per RN no line or bleeding issues with pt's heparin.  His CRRT heparin has been stopped.    Goal of  Therapy:  Heparin level 0.3-0.5 units/ml Monitor platelets by anticoagulation protocol: Yes   Plan:   Increase heparin infusion to 1300 units/hr. Will avoid boluses given bleeding   Recheck HL in 8 hours  Daily CBC/HL  Continue to monitor for s/s of bleeding     Royetta Asal, PharmD, BCPS 10/20/2018 2:06 AM

## 2018-10-20 NOTE — Progress Notes (Signed)
Pt's head turned to left at this time. No complications noted.  RNx2, RTx1

## 2018-10-20 NOTE — Progress Notes (Signed)
Inpatient Diabetes Program Recommendations  AACE/ADA: New Consensus Statement on Inpatient Glycemic Control (2015)  Target Ranges:  Prepandial:   less than 140 mg/dL      Peak postprandial:   less than 180 mg/dL (1-2 hours)      Critically ill patients:  140 - 180 mg/dL   Lab Results  Component Value Date   GLUCAP 286 (H) 10/20/2018   HGBA1C 9.1 (H) 11/09/2018    Review of Glycemic Control Results for Thomas Gill, Thomas Gill (MRN [id number]) as of 10/20/2018 15:53  Ref. Range 10/19/2018 23:22 10/20/2018 03:48 10/20/2018 07:44 10/20/2018 11:58  Glucose-Capillary Latest Ref Range: 70 - 99 mg/dL 178 (H) 291 (H) 268 (H) 286 (H)   Diabetes history: Type 2 DM Current orders for Inpatient glycemic control: Levemir 20 units BID, Novolog 0-20 units Q4H, Novolog 3 units Q4H.  Decadron 6 mg QD  Inpatient Diabetes Program Recommendations:    Consider increasing Levemir to 24 units BID.  Text page sent.  Thanks,  Bronson Curb, MSN, RNC-OB Diabetes Coordinator [phone] (8a-5p)

## 2018-10-20 NOTE — Progress Notes (Signed)
NAMEAdael Gill, MRN:  [id number], DOB:  1955/12/26, LOS: 9 ADMISSION DATE:  10/16/2018, CONSULTATION DATE:  9/15 REFERRING MD:  Ashok Cordia, CHIEF COMPLAINT:  Dyspnea   Brief History   63 y/o male with DM2 admitted with COVID Pneumonia causing severe acute respiratory failure with hypoxemia.  Intubated on 9/16  Past Medical History  Former smoker DM2 Obesity  [facility] Events   9/14 admission 9/17 intubated, proned.  Cardiac arrest later in day while he was in prone position.  Central line placed.  Started on Levophed drip 9/18- Paralyzed for vent dysynchrony 9/19- Started heparin for DVT 9/20-bronchoscopy for mucous plugging, left hemithorax opacification 9/21-starting CVVH, restart proning  Consults:  PCCM Nephrology  Procedures:  ETT 9/17>>> Femoral CVL 9/17 >> 9/20 Lt IJ CVL 9/20 >> Rt fem a line 9/21 >> Rt IL HD cath 9/21 >>  Significant Diagnostic Tests:  Lower extremity duplex 9/19> age indeterminate bilateral DVT  Micro Data:  9/14 SARS COV 2 positive BAL 9/20 >> proteus BAL 9/20 AFB, Fungus >   Antimicrobials/COVID Rx  9/14 Decadron >  9/14 Remdesivir >  9/18  Vancomycin 9/21> 9/22 Cefepime 9/21>  Interim history/subjective:   Remains in prone position Remains on CVVHD   Objective   Blood pressure 98/67, pulse (!) 52, temperature (!) 96.8 F (36 C), resp. rate (!) 34, height _0  (1.6 m), weight 95.3 kg, SpO2 97 %. CVP:  [13 mmHg-35 mmHg] 13 mmHg  Vent Mode: PRVC FiO2 (%):  [70 %-100 %] 80 % Set Rate:  [34 bmp-7034 bmp] 35 bmp Vt Set:  [390 mL-450 mL] 450 mL PEEP:  [12 cmH20] 12 cmH20 Plateau Pressure:  [23 cmH20-25 cmH20] 23 cmH20   Intake/Output Summary (Last 24 hours) at 10/20/2018 0759 Last data filed at 10/20/2018 0700 Gross per 24 hour  Intake 2927.56 ml  Output 4897 ml  Net -1969.44 ml   Filed Weights   10/18/18 0600 10/19/18 0500 10/20/18 0500  Weight: 102 kg 98.4 kg 95.3 kg    Examination:  General:  In bed on  vent HENT: NCAT ETT in place PULM: CTA B, vent supported breathing CV: RRR, no mgr GI: BS+, soft, nontender MSK: normal bulk and tone Neuro: sedated on vent   Resolved Hospital Problem list     Assessment & Plan:  ARDS due to COVID 19 pneumonia: no significant change in  Continue mechanical ventilation per ARDS protocol Target TVol 6-8cc/kgIBW Target Plateau Pressure < 30cm H20 Target driving pressure less than 15 cm of water Target PaO2 55-65: titrate PEEP/FiO2 per protocol As long as PaO2 to FiO2 ratio is less than 1:150 position in prone position for 16 hours a day Check CVP daily if CVL in place Target CVP less than 4, diurese as necessary Ventilator associated pneumonia prevention protocol 9/23 plan: continue prone positioning today and volume removal per CVVHD  AKI, oliguric CVVHD per renal Monitor BMET and UOP Replace electrolytes as needed  HCAP: proteus Cefepime  Cardiac arrest, PE? Tele  Lower extremity DVT Heparin infusion  Need for sedation for mechanical ventilation RASS target -4 to -5 Stop nimbex infusion Continue prn vecuronium  Shock liver Monitor LFT  Hyperglycemia: Levemir/SSI per [facility]  Best practice:  Diet: tube feeding Pain/Anxiety/Delirium protocol (if indicated): as above VAP protocol (if indicated): yes DVT prophylaxis: heparin infusion GI prophylaxis: famotidine Glucose control: SSI Mobility: bed rest Code Status: full Family Communication: I called Dwana Curd in [place] [phone]) for an update today, questions answered Disposition: remain  in ICU  Labs   CBC: Recent Labs  Lab 10/14/18 2325  10/15/18 0440  10/16/18 0445  10/17/18 0350 10/18/18 0443  10/18/18 1209 10/19/18 0623 10/19/18 1640 10/19/18 1955 10/20/18 0456  WBC 19.7*  --  20.7*  --  11.1*  --  13.4* 9.3  --   --   --   --   --  12.5*  NEUTROABS 12.7*  --   --   --   --   --   --   --   --   --   --   --   --   --   HGB 12.8*   < > 13.4   < >  11.7*   < > 11.9* 11.1*   < > 11.2* 11.6* 11.2* 11.9* 11.3*  20.1*  HCT 45.6   < > 45.3   < > 38.3*   < > 38.6* 36.8*   < > 33.0* 34.0* 33.0* 35.0* 37.4*  59.0*  MCV 109.4*  --  102.7*  --  100.5*  --  99.0 100.0  --   --   --   --   --  100.5*  PLT 209  --  192  --  193  --  232 171  --   --   --   --   --  176   < > = values in this interval not displayed.    Basic Metabolic Panel: Recent Labs  Lab 10/16/18 1650 10/17/18 0350 10/18/18 0443  10/18/18 1631 10/19/18 0430 10/19/18 0623 10/19/18 1608 10/19/18 1640 10/19/18 1955 10/20/18 0456  NA  --  143 144   < > 144 143 143 139 140 140 137  139  K  --  5.6* 6.0*   < > 5.7* 5.4* 5.4* 5.2* 5.1 4.8 5.2*  4.9  CL  --  104 106  --  110 106  --  104  --   --  103  CO2  --  29 29  --  26 28  --  26  --   --  25  GLUCOSE  --  267* 365*  --  304* 193*  --  209*  --   --  312*  BUN  --  108* 144*  --  136* 98*  --  82*  --   --  75*  CREATININE  --  4.40* 4.58*  --  4.45* 2.94*  --  2.24*  --   --  1.96*  CALCIUM  --  7.9* 7.8*  --  7.7* 8.0*  --  7.8*  --   --  8.0*  MG 2.5* 2.5* 2.5*  --   --  2.6*  --   --   --   --  2.5*  PHOS 5.2* 4.6 3.7  --  3.9 4.1  --  2.6  --   --  3.7   < > = values in this interval not displayed.   GFR: Estimated Creatinine Clearance: 41 mL/min (A) (by C-G formula based on SCr of 1.96 mg/dL (H)). Recent Labs  Lab 10/15/18 0440 10/16/18 0445 10/17/18 0350 10/18/18 0443 10/20/18 0456  WBC 20.7* 11.1* 13.4* 9.3 12.5*  LATICACIDVEN 3.4*  --   --   --   --     Liver Function Tests: Recent Labs  Lab 10/14/18 0435 10/15/18 0440 10/16/18 0445 10/17/18 0350 10/18/18 0443 10/18/18 1631 10/19/18 0430 10/19/18 1608 10/20/18 0456  AST 60* 139* 56* 38  43*  --   --   --   --   ALT 64* 101* 69* 59* 47*  --   --   --   --   ALKPHOS 108 122 93 100 92  --   --   --   --   BILITOT 0.6 0.8 0.3 0.2* 0.5  --   --   --   --   PROT 6.5 5.7* 5.2* 5.7* 5.8*  --   --   --   --   ALBUMIN 2.5* 2.1* 1.8* 1.9* 1.7*  1.7* 1.9* 1.7* 1.8*   No results for input(s): LIPASE, AMYLASE in the last 168 hours. No results for input(s): AMMONIA in the last 168 hours.  ABG    Component Value Date/Time   PHART 7.224 (L) 10/20/2018 0456   PCO2ART 64.3 (H) 10/20/2018 0456   PO2ART 64.0 (L) 10/20/2018 0456   HCO3 26.6 10/20/2018 0456   TCO2 29 10/20/2018 0456   ACIDBASEDEF 3.0 (H) 10/20/2018 0456   O2SAT 87.0 10/20/2018 0456     Coagulation Profile: No results for input(s): INR, PROTIME in the last 168 hours.  Cardiac Enzymes: No results for input(s): CKTOTAL, CKMB, CKMBINDEX, TROPONINI in the last 168 hours.  HbA1C: Hgb A1c MFr Bld  Date/Time Value Ref Range Status  10/21/2018 02:30 PM 9.1 (H) 4.8 - 5.6 % Final    Comment:    (NOTE)         Prediabetes: 5.7 - 6.4         Diabetes: >6.4         Glycemic control for adults with diabetes: <7.0     CBG: Recent Labs  Lab 10/19/18 1614 10/19/18 1940 10/19/18 2322 10/20/18 0348 10/20/18 0744  GLUCAP 176* 181* 178* 291* 268*     Critical care time: 35 minutes    Roselie Awkward, MD [place] [facility] Pager: [phone] Cell: [phone] If no response, call [phone]

## 2018-10-21 ENCOUNTER — Inpatient Hospital Stay (HOSPITAL_COMMUNITY): Payer: Medicaid Other

## 2018-10-21 DIAGNOSIS — N179 Acute kidney failure, unspecified: Secondary | ICD-10-CM | POA: Diagnosis not present

## 2018-10-21 DIAGNOSIS — I469 Cardiac arrest, cause unspecified: Secondary | ICD-10-CM

## 2018-10-21 DIAGNOSIS — U071 COVID-19: Secondary | ICD-10-CM | POA: Diagnosis not present

## 2018-10-21 DIAGNOSIS — J9811 Atelectasis: Secondary | ICD-10-CM | POA: Diagnosis not present

## 2018-10-21 DIAGNOSIS — J9601 Acute respiratory failure with hypoxia: Secondary | ICD-10-CM | POA: Diagnosis not present

## 2018-10-21 LAB — RENAL FUNCTION PANEL
Albumin: 1.8 g/dL — ABNORMAL LOW (ref 3.5–5.0)
Albumin: 1.8 g/dL — ABNORMAL LOW (ref 3.5–5.0)
Anion gap: 12 (ref 5–15)
Anion gap: 14 (ref 5–15)
BUN: 80 mg/dL — ABNORMAL HIGH (ref 6–20)
BUN: 71 mg/dL — ABNORMAL HIGH (ref 6–20)
CO2: 23 mmol/L (ref 22–32)
CO2: 21 mmol/L — ABNORMAL LOW (ref 22–32)
Calcium: 7.8 mg/dL — ABNORMAL LOW (ref 8.9–10.3)
Calcium: 8 mg/dL — ABNORMAL LOW (ref 8.9–10.3)
Chloride: 101 mmol/L (ref 98–111)
Chloride: 101 mmol/L (ref 98–111)
Creatinine, Ser: 2.28 mg/dL — ABNORMAL HIGH (ref 0.61–1.24)
Creatinine, Ser: 2.08 mg/dL — ABNORMAL HIGH (ref 0.61–1.24)
GFR calc Af Amer: 35 mL/min — ABNORMAL LOW (ref >60)
GFR calc Af Amer: 39 mL/min — ABNORMAL LOW (ref >60)
GFR calc non Af Amer: 30 mL/min — ABNORMAL LOW (ref >60)
GFR calc non Af Amer: 34 mL/min — ABNORMAL LOW (ref >60)
Glucose, Bld: 184 mg/dL — ABNORMAL HIGH (ref 70–99)
Glucose, Bld: 296 mg/dL — ABNORMAL HIGH (ref 70–99)
Phosphorus: 4.3 mg/dL (ref 2.5–4.6)
Phosphorus: 6.2 mg/dL — ABNORMAL HIGH (ref 2.5–4.6)
Potassium: 5.5 mmol/L — ABNORMAL HIGH (ref 3.5–5.1)
Potassium: 5.5 mmol/L — ABNORMAL HIGH (ref 3.5–5.1)
Sodium: 136 mmol/L (ref 135–145)
Sodium: 136 mmol/L (ref 135–145)

## 2018-10-21 LAB — GLUCOSE, CAPILLARY
Glucose-Capillary: 146 mg/dL — ABNORMAL HIGH (ref 70–99)
Glucose-Capillary: 182 mg/dL — ABNORMAL HIGH (ref 70–99)
Glucose-Capillary: 258 mg/dL — ABNORMAL HIGH (ref 70–99)
Glucose-Capillary: 274 mg/dL — ABNORMAL HIGH (ref 70–99)
Glucose-Capillary: 236 mg/dL — ABNORMAL HIGH (ref 70–99)

## 2018-10-21 LAB — CBC
HCT: 37.7 % — ABNORMAL LOW (ref 39.0–52.0)
Hemoglobin: 11.5 g/dL — ABNORMAL LOW (ref 13.0–17.0)
MCH: 30.6 pg (ref 26.0–34.0)
MCHC: 30.5 g/dL (ref 30.0–36.0)
MCV: 100.3 fL — ABNORMAL HIGH (ref 80.0–100.0)
Platelets: 177 10*3/uL (ref 150–400)
RBC: 3.76 MIL/uL — ABNORMAL LOW (ref 4.22–5.81)
RDW: 14.9 % (ref 11.5–15.5)
WBC: 16.1 10*3/uL — ABNORMAL HIGH (ref 4.0–10.5)
nRBC: 4.5 % — ABNORMAL HIGH (ref 0.0–0.2)

## 2018-10-21 LAB — POCT I-STAT 7, (LYTES, BLD GAS, ICA,H+H)
Acid-base deficit: 3 mmol/L — ABNORMAL HIGH (ref 0.0–2.0)
Acid-base deficit: 3 mmol/L — ABNORMAL HIGH (ref 0.0–2.0)
Bicarbonate: 25 mmol/L (ref 20.0–28.0)
Bicarbonate: 22.3 mmol/L (ref 20.0–28.0)
Calcium, Ion: 1.18 mmol/L (ref 1.15–1.40)
Calcium, Ion: 1.14 mmol/L — ABNORMAL LOW (ref 1.15–1.40)
HCT: 38 % — ABNORMAL LOW (ref 39.0–52.0)
HCT: 37 % — ABNORMAL LOW (ref 39.0–52.0)
Hemoglobin: 12.9 g/dL — ABNORMAL LOW (ref 13.0–17.0)
Hemoglobin: 12.6 g/dL — ABNORMAL LOW (ref 13.0–17.0)
O2 Saturation: 87 %
O2 Saturation: 94 %
Patient temperature: 36.8
Potassium: 5.3 mmol/L — ABNORMAL HIGH (ref 3.5–5.1)
Potassium: 4.7 mmol/L (ref 3.5–5.1)
Sodium: 136 mmol/L (ref 135–145)
Sodium: 131 mmol/L — ABNORMAL LOW (ref 135–145)
TCO2: 27 mmol/L (ref 22–32)
TCO2: 23 mmol/L (ref 22–32)
pCO2 arterial: 53.6 mmHg — ABNORMAL HIGH (ref 32.0–48.0)
pCO2 arterial: 40.7 mmHg (ref 32.0–48.0)
pH, Arterial: 7.275 — ABNORMAL LOW (ref 7.350–7.450)
pH, Arterial: 7.347 — ABNORMAL LOW (ref 7.350–7.450)
pO2, Arterial: 60 mmHg — ABNORMAL LOW (ref 83.0–108.0)
pO2, Arterial: 75 mmHg — ABNORMAL LOW (ref 83.0–108.0)

## 2018-10-21 LAB — MAGNESIUM: Magnesium: 2.9 mg/dL — ABNORMAL HIGH (ref 1.7–2.4)

## 2018-10-21 LAB — APTT: aPTT: 68 seconds — ABNORMAL HIGH (ref 24–36)

## 2018-10-21 LAB — HEPARIN LEVEL (UNFRACTIONATED)
Heparin Unfractionated: 0.5 IU/mL (ref 0.30–0.70)
Heparin Unfractionated: 0.58 IU/mL (ref 0.30–0.70)

## 2018-10-21 MED ORDER — IPRATROPIUM-ALBUTEROL 0.5-2.5 (3) MG/3ML IN SOLN: 3.0000 mL | Freq: Four times a day (QID) | RESPIRATORY_TRACT | Status: DC

## 2018-10-21 MED ORDER — INSULIN DETEMIR 100 UNIT/ML ~~LOC~~ SOLN
30.0000 [IU] | Freq: Two times a day (BID) | SUBCUTANEOUS | Status: DC
  Administered 2018-10-21 – 2018-10-22 (×2): 30 [IU] via SUBCUTANEOUS
  Filled 2018-10-21 (×3): qty 0.3

## 2018-10-21 MED ORDER — IPRATROPIUM-ALBUTEROL 0.5-2.5 (3) MG/3ML IN SOLN: 3.0000 mL | Freq: Four times a day (QID) | RESPIRATORY_TRACT | Status: DC | PRN

## 2018-10-21 MED ORDER — OXYCODONE HCL 5 MG PO TABS
5.0000 mg | ORAL_TABLET | Freq: Four times a day (QID) | ORAL | Status: DC
  Administered 2018-10-21 – 2018-10-25 (×17): 5 mg
  Filled 2018-10-21 (×17): qty 1

## 2018-10-21 MED ORDER — ARFORMOTEROL TARTRATE 15 MCG/2ML IN NEBU
15.0000 ug | INHALATION_SOLUTION | Freq: Two times a day (BID) | RESPIRATORY_TRACT | Status: DC
  Filled 2018-10-21: qty 2

## 2018-10-21 MED ORDER — BUDESONIDE 0.5 MG/2ML IN SUSP: 0.5000 mg | Freq: Two times a day (BID) | RESPIRATORY_TRACT | Status: DC

## 2018-10-21 MED FILL — Medication: Qty: 1 | Status: AC

## 2018-10-21 NOTE — Progress Notes (Signed)
Spoke with patient's brother via interpreter on the phone. All questions answered and family updated.

## 2018-10-21 NOTE — Progress Notes (Signed)
Pt unproned at this time with no complications. ETT was secured by a commercial tube holder after turning supine and remains secure and in proper position

## 2018-10-21 NOTE — Progress Notes (Signed)
[place] TEAM 1 - Stepdown/ICU TEAM  Natalie Leclaire  ZOX:[id number] DOB: 1955-05-01 DOA: 10/21/2018 PCP: Patient, No Pcp Per    Brief Narrative:  62 y.o.malediagnosed with COVID-19 10/20/2018 who presented to the ED with progressive shortness of breath, cough and diarrhea. When EMS arrived at his house he was satting in the 60s on room air.  Patient was admitted to the ICU and intubated within 24 hours of arrival. Hospital course complicated by cardiopulmonary arrest on 9/17 followed by AKI now requiring CRRT.  Significant Events: 9/14 admitted 9/15 intubated 9/17 cardiopulmonary arrest, asystole, ROSC in 9 minutes per chart 9/17 femoral line 9/18 DVT found, started on heparin 9/20 DC femoral line, insert left IJ 9/20 bronchoscopy for mucous plugging 9/21 hemodialysis catheter placement, start CRRT 9/24 spontaneous PTX - s/p R chest tube   COVID-19 specific Treatment: Remdesivir 9/14 > 9/18 Decadron 9/14 >  Subjective: Sedated. Appears comfortable on vent in prone position.   Assessment & Plan:  COVID Pneumonia - ARDS - Acute hypoxic respiratory failure Vent management per PCCM -has completed the course of Remdesivir -steroids continue  Acute R tension PTX Noted on rounds today - now s/p chest tube -follow-up chest x-ray confirms correction of pneumothorax  Cardiac arrest - asystole 9/17 ROSC in 9 mins after epi x3 - ?due to PE  Hemoptysis Felt to be due to PE - no evidence of this today  Acute DVT Noted on venous doppler 9/18 - on heparin anticoag  Acute kidney failure  Felt to be ATN due to cardiac arrest - cont CRRT - Nephrology following   Hyperkalemia  Corrected with CRRT  Transaminitis  Likely due to COVID + shock liver - follow  DM2 CBG trending upward -adjust treatment and follow  DVT prophylaxis: IV heparin Code Status: FULL CODE Family Communication:  Disposition Plan:   Consultants:  PCCM  Antimicrobials:  Cefepime 9/21 >  Objective: Blood  pressure 123/70, pulse 73, temperature 98.1 F (36.7 C), resp. rate (!) 35, height _0  (1.6 m), weight 93.2 kg, SpO2 (!) 86 %.  Intake/Output Summary (Last 24 hours) at 10/21/2018 1034 Last data filed at 10/21/2018 1000 Gross per 24 hour  Intake 2903.5 ml  Output 5177 ml  Net -2273.5 ml   Filed Weights   10/19/18 0500 10/20/18 0500 10/21/18 0500  Weight: 98.4 kg 95.3 kg 93.2 kg    Examination: General: Sedated on ventilator Lungs: Very distant breath sounds bilateral fields with very faint diffuse wheezing Cardiovascular: Regular rate and rhythm without murmur gallop or rub normal S1 and S2 Abdomen: Nontender, nondistended, soft, bowel sounds positive, no rebound, no ascites, no appreciable mass Extremities: No significant cyanosis, clubbing, or edema bilateral lower extremities  CBC: Recent Labs  Lab 10/14/18 2325  10/18/18 0443  10/20/18 0456 10/20/18 1736 10/21/18 0441 10/21/18 0530  WBC 19.7*   < > 9.3  --  12.5*  --   --  16.1*  NEUTROABS 12.7*  --   --   --   --   --   --   --   HGB 12.8*   < > 11.1*   < > 11.3*  20.1* 12.6* 12.9* 11.5*  HCT 45.6   < > 36.8*   < > 37.4*  59.0* 37.0* 38.0* 37.7*  MCV 109.4*   < > 100.0  --  100.5*  --   --  100.3*  PLT 209   < > 171  --  176  --   --  177   < > =  values in this interval not displayed.   Basic Metabolic Panel: Recent Labs  Lab 10/19/18 0430  10/20/18 0456 10/20/18 1548 10/20/18 1736 10/21/18 0441 10/21/18 0530  NA 143   < > 137  139 134* 136 136 136  K 5.4*   < > 5.2*  4.9 5.3* 5.5* 5.3* 5.5*  CL 106   < > 103 102  --   --  101  CO2 28   < > 25 24  --   --  23  GLUCOSE 193*   < > 312* 259*  --   --  184*  BUN 98*   < > 75* 72*  --   --  80*  CREATININE 2.94*   < > 1.96* 2.15*  --   --  2.28*  CALCIUM 8.0*   < > 8.0* 7.8*  --   --  7.8*  MG 2.6*  --  2.5*  --   --   --  2.9*  PHOS 4.1   < > 3.7 4.0  --   --  4.3   < > = values in this interval not displayed.   GFR: Estimated Creatinine Clearance:  34.8 mL/min (A) (by C-G formula based on SCr of 2.28 mg/dL (H)).  Liver Function Tests: Recent Labs  Lab 10/15/18 0440 10/16/18 0445 10/17/18 0350 10/18/18 0443  10/19/18 1608 10/20/18 0456 10/20/18 1548 10/21/18 0530  AST 139* 56* 38 43*  --   --   --   --   --   ALT 101* 69* 59* 47*  --   --   --   --   --   ALKPHOS 122 93 100 92  --   --   --   --   --   BILITOT 0.8 0.3 0.2* 0.5  --   --   --   --   --   PROT 5.7* 5.2* 5.7* 5.8*  --   --   --   --   --   ALBUMIN 2.1* 1.8* 1.9* 1.7*   < > 1.7* 1.8* 1.9* 1.8*   < > = values in this interval not displayed.    HbA1C: Hgb A1c MFr Bld  Date/Time Value Ref Range Status  10/21/2018 02:30 PM 9.1 (H) 4.8 - 5.6 % Final    Comment:    (NOTE)         Prediabetes: 5.7 - 6.4         Diabetes: >6.4         Glycemic control for adults with diabetes: <7.0     CBG: Recent Labs  Lab 10/20/18 1626 10/20/18 2021 10/20/18 2312 10/21/18 0330 10/21/18 0737  GLUCAP 305* 224* 266* 146* 182*    Recent Results (from the past 240 hour(s))  SARS Coronavirus 2 [facility] order, Performed in [facility] hospital lab) Nasopharyngeal Nasopharyngeal Swab     Status: Abnormal   Collection Time: 10/23/2018 11:30 AM   Specimen: Nasopharyngeal Swab  Result Value Ref Range Status   SARS Coronavirus 2 POSITIVE (A) NEGATIVE Final    Comment: RESULT CALLED TO, READ BACK BY AND VERIFIED WITH: RN H HARDY AT 1304 09/29/2018 BY L BENFIELD (NOTE) If result is NEGATIVE SARS-CoV-2 target nucleic acids are NOT DETECTED. The SARS-CoV-2 RNA is generally detectable in upper and lower  respiratory specimens during the acute phase of infection. The lowest  concentration of SARS-CoV-2 viral copies this assay can detect is 250  copies / mL. A negative result does  not preclude SARS-CoV-2 infection  and should not be used as the sole basis for treatment or other  patient management decisions.  A negative result may occur with  improper specimen collection /  handling, submission of specimen other  than nasopharyngeal swab, presence of viral mutation(s) within the  areas targeted by this assay, and inadequate number of viral copies  (<250 copies / mL). A negative result must be combined with clinical  observations, patient history, and epidemiological information. If result is POSITIVE SARS-CoV-2 target nucleic acids are DETEC TED. The SARS-CoV-2 RNA is generally detectable in upper and lower  respiratory specimens during the acute phase of infection.  Positive  results are indicative of active infection with SARS-CoV-2.  Clinical  correlation with patient history and other diagnostic information is  necessary to determine patient infection status.  Positive results do  not rule out bacterial infection or co-infection with other viruses. If result is PRESUMPTIVE POSTIVE SARS-CoV-2 nucleic acids MAY BE PRESENT.   A presumptive positive result was obtained on the submitted specimen  and confirmed on repeat testing.  While 2019 novel coronavirus  (SARS-CoV-2) nucleic acids may be present in the submitted sample  additional confirmatory testing may be necessary for epidemiological  and / or clinical management purposes  to differentiate between  SARS-CoV-2 and other Sarbecovirus currently known to infect humans.  If clinically indicated additional testing with an alternate test  methodology (LAB7 453) is advised. The SARS-CoV-2 RNA is generally  detectable in upper and lower respiratory specimens during the acute  phase of infection. The expected result is Negative. Fact Sheet for Patients:  StrictlyIdeas.no Fact Sheet for Healthcare Providers: BankingDealers.co.za This test is not yet approved or cleared by the [place] FDA and has been authorized for detection and/or diagnosis of SARS-CoV-2 by FDA under an Emergency Use Authorization (EUA).  This EUA will remain in effect (meaning this  test can be used) for the duration of the COVID-19 declaration under Section 564(b)(1) of the Act, 21 U.S.C. section 360bbb-3(b)(1), unless the authorization is terminated or revoked sooner. Performed at [facility], [place] [address] [place], [place] [address]   MRSA PCR Screening     Status: None   Collection Time: 10/12/18  9:05 PM   Specimen: Nasal Mucosa; Nasopharyngeal  Result Value Ref Range Status   MRSA by PCR NEGATIVE NEGATIVE Final    Comment:        The GeneXpert MRSA Assay (FDA approved for NASAL specimens only), is one component of a comprehensive MRSA colonization surveillance program. It is not intended to diagnose MRSA infection nor to guide or monitor treatment for MRSA infections. Performed at [facility], [place] [address] [place], [place] [address]   Culture, bal-quantitative     Status: Abnormal (Preliminary result)   Collection Time: 10/17/18 11:37 AM   Specimen: Bronchoalveolar Lavage; Respiratory  Result Value Ref Range Status   Specimen Description   Final    BRONCHIAL ALVEOLAR LAVAGE Performed at [facility] [address] [place], [place] [address]    Special Requests   Final    NONE Performed at [facility], [place] [address] [place], [place] [address]    Gram Stain   Final    FEW WBC PRESENT, PREDOMINANTLY MONONUCLEAR MODERATE GRAM POSITIVE COCCI FEW GRAM NEGATIVE COCCI RARE GRAM NEGATIVE RODS    Culture (A)  Final    >=100,000 COLONIES/mL PROTEUS MIRABILIS CULTURE REINCUBATED FOR BETTER GROWTH Performed at [facility], [place] [address] [place], [place] [address]    Report Status PENDING  Incomplete   Organism ID, Bacteria PROTEUS MIRABILIS (A)  Final      Susceptibility   Proteus mirabilis - MIC*    AMPICILLIN <=2 SENSITIVE Sensitive     CEFAZOLIN <=4 SENSITIVE Sensitive     CEFEPIME <=1 SENSITIVE Sensitive     CEFTAZIDIME <=1 SENSITIVE Sensitive     CEFTRIAXONE  <=1 SENSITIVE Sensitive     CIPROFLOXACIN <=0.25 SENSITIVE Sensitive     GENTAMICIN <=1 SENSITIVE Sensitive     IMIPENEM 1 SENSITIVE Sensitive     TRIMETH/SULFA <=20 SENSITIVE Sensitive     AMPICILLIN/SULBACTAM <=2 SENSITIVE Sensitive     PIP/TAZO <=4 SENSITIVE Sensitive     * >=100,000 COLONIES/mL PROTEUS MIRABILIS  Acid Fast Smear (AFB)     Status: None   Collection Time: 10/17/18 11:38 AM   Specimen: Bronchial Alveolar Lavage; Respiratory  Result Value Ref Range Status   AFB Specimen Processing Concentration  Final   Acid Fast Smear Negative  Final    Comment: (NOTE) Performed At: [facility] [place], [place] [id number] Rush Farmer MD TD:[id number]    Source (AFB) BRONCHIAL ALVEOLAR LAVAGE  Final    Comment: Performed at [facility] [address] [place], [place] [address]  MRSA PCR Screening     Status: None   Collection Time: 10/18/18 11:48 AM   Specimen: Nasal Mucosa; Nasopharyngeal  Result Value Ref Range Status   MRSA by PCR NEGATIVE NEGATIVE Final    Comment:        The GeneXpert MRSA Assay (FDA approved for NASAL specimens only), is one component of a comprehensive MRSA colonization surveillance program. It is not intended to diagnose MRSA infection nor to guide or monitor treatment for MRSA infections. Performed at [facility], [place] [address], [place], [place] [address]      Scheduled Meds: . artificial tears  1 application Both Eyes Q8G  . chlorhexidine gluconate (MEDLINE KIT)  15 mL Mouth Rinse BID  . Chlorhexidine Gluconate Cloth  6 each Topical Daily  . dexamethasone (DECADRON) injection  6 mg Intravenous Q12H  . famotidine  20 mg Per Tube QHS  . feeding supplement (PRO-STAT SUGAR FREE 64)  60 mL Per Tube TID  . insulin aspart  0-20 Units Subcutaneous Q4H  . insulin aspart  3 Units Subcutaneous Q4H  . insulin detemir  25 Units Subcutaneous BID  . mouth rinse  15 mL Mouth Rinse 10  times per day  . multivitamin with minerals  1 tablet Per Tube Daily  . polyethylene glycol  17 g Per Tube BID  . sennosides  5 mL Per Tube BID  . sodium chloride flush  10-40 mL Intracatheter Q12H   Continuous Infusions: . sodium chloride 10 mL/hr at 10/21/18 1000  . ceFEPime (MAXIPIME) IV 2 g (10/21/18 0039)  . feeding supplement (VITAL 1.5 CAL) 1,000 mL (10/19/18 1730)  . fentaNYL infusion INTRAVENOUS 300 mcg/hr (10/21/18 1000)  . heparin 1,500 Units/hr (10/21/18 1000)  . midazolam 6 mg/hr (10/21/18 1000)  . norepinephrine (LEVOPHED) Adult infusion Stopped (10/16/18 0111)  . prismasol BGK 0/2.5 300 mL/hr at 10/21/18 0041  . prismasol BGK 0/2.5 400 mL/hr at 10/21/18 0852  . prismasol BGK 4/2.5 1,500 mL/hr at 10/21/18 5003     LOS: 10 days   Cherene Altes, MD [facility] Office  [phone] Pager - Text Page per Shea Evans  If 7PM-7AM, please contact night-coverage per Amion 10/21/2018, 10:34 AM

## 2018-10-21 NOTE — Procedures (Signed)
PCCM Video Bronchoscopy Procedure Note  The patient was informed of the risks (including but not limited to bleeding, infection, respiratory failure, lung injury, tooth/oral injury) and benefits of the procedure and gave consent, see chart.  Indication: Acute respiratory failure with hypoxemia, mucus plugging  Post Procedure Diagnosis: same, RLL and RUL mucus plugging  Location: [facility] ICU  Condition pre procedure: critically ill, on vent  Medications for procedure: versed in fusion, fentanyl infusion  Procedure description: The bronchoscope was introduced through the endotracheal tube and passed to the bilateral lungs to the level of the subsegmental bronchi throughout the tracheobronchial tree. Trachea normal, carina sharp, left lung anatomy and mucosa normal.  Thick mucus noted at the orifice of the right mainstem, occluding the RUL, some in the posterior segment of the RLL.  No masses or other inflammatory change in right.  ETT in good position.   Procedures performed: none  Specimens sent: none  Condition post procedure: critically ill, on vent  EBL: none  Complications: none  Roselie Awkward, MD [place] PCCM Pager: [phone] Cell: [phone] If no response, call [phone]

## 2018-10-21 NOTE — Progress Notes (Signed)
RNx4, RTx2 at bedside. Placed patient in prone position.  ETT secured 25 cm @lip . No complications.

## 2018-10-21 NOTE — Progress Notes (Signed)
Patient remains proned. Head repositioned, arms rotated. ETT secure. No complications. Patient stable. RNx2, RTx2 at bedside

## 2018-10-21 NOTE — Progress Notes (Signed)
ANTICOAGULATION CONSULT NOTE - Follow Up Consult  Pharmacy Consult for Heparin Indication: Suspected PE, Bilateral DVTs, CRRT anticoagulation  No Known Allergies  Patient Measurements: Height: 5\' 3"  (160 cm) Weight: 205 lb 7.5 oz (93.2 kg) IBW/kg (Calculated) : 56.9 Heparin Dosing Weight: 79.5 kg  Vital Signs: Temp: 97.5 F (36.4 C) (09/24 1300) Temp Source: Rectal (09/24 1200) BP: 131/72 (09/24 1300) Pulse Rate: 70 (09/24 1300)  Labs: Recent Labs    10/19/18 0430  10/20/18 0456 10/20/18 1010 10/20/18 1548 10/20/18 1736 10/20/18 2015 10/21/18 0441 10/21/18 0530 10/21/18 0956  HGB  --    < > 11.3*  20.1*  --   --  12.6*  --  12.9* 11.5*  --   HCT  --    < > 37.4*  59.0*  --   --  37.0*  --  38.0* 37.7*  --   PLT  --   --  176  --   --   --   --   --  177  --   APTT 61*  --  48*  --   --   --   --   --  68*  --   HEPARINUNFRC  --    < >  --  0.25*  --   --  0.26*  --   --  0.50  CREATININE 2.94*   < > 1.96*  --  2.15*  --   --   --  2.28*  --    < > = values in this interval not displayed.    Estimated Creatinine Clearance: 34.8 mL/min (A) (by C-G formula based on SCr of 2.28 mg/dL (H)).   Medications:  Infusions:  . sodium chloride 10 mL/hr at 10/21/18 1300  . ceFEPime (MAXIPIME) IV 2 g (10/21/18 1341)  . feeding supplement (VITAL 1.5 CAL) 1,000 mL (10/19/18 1730)  . fentaNYL infusion INTRAVENOUS 300 mcg/hr (10/21/18 1300)  . heparin 1,500 Units/hr (10/21/18 1300)  . midazolam 6 mg/hr (10/21/18 1300)  . norepinephrine (LEVOPHED) Adult infusion Stopped (10/16/18 0111)  . prismasol BGK 0/2.5 300 mL/hr at 10/21/18 0041  . prismasol BGK 0/2.5 400 mL/hr at 10/21/18 0852  . prismasol BGK 4/2.5 1,500 mL/hr at 10/21/18 1211    Assessment: Thomas Gill is a 63 y.o. male presented on 9/14 with worsening O2 saturation, cough, diarrhea, COVID-19 positive.  On 9/17, patient decompensated requiring intubation. On 9/18 patient had acardiorespiratory arrest/code blueevent  that lasted approximately 9 minutes until achieving ROSC.  9/18 Vascular US positive for bilateral DVTs.  Pharmacy is consulted to dose heparin for DVT and suspected PE.  Heparin was paused on 9/19 given oral bleeding which has resolved. Per MD, okay to resume IV heparin at 1800 on 9/20.  Today, 10/21/2018:  Heparin level is therapuetic at 0.50 on heparin at 1500 units/hr  CBC stable:  Hgb 11.5, Plt 177  Per RN no line or bleeding issues with pt's heparin.   Continues on CRRT.  Heparin in CRRT circuit was stopped on 9/22 when systemic heparin reached therapeutic levels.  Despite several sub-therapeutic systemic heparin levels, the RN confirms no CRRT clotting or increased filter pressures.    Goal of Therapy:  Heparin level 0.3-0.5 units/ml Monitor platelets by anticoagulation protocol: Yes   Plan:  Continue heparin gtt at 1,500 units/hr Obtian 6hr confirmatory heparin level Monitor daily heparin level, CBC, s/s of bleed   Acey Lav, PharmD  PGY1 Acute Care Pharmacy Resident 10/21/2018 1:44 PM

## 2018-10-21 NOTE — Progress Notes (Signed)
Pt proned at this time. RT and RN moved pt's head to Right with no complications. ETT remains secure and in proper position.

## 2018-10-21 NOTE — Procedures (Signed)
Chest Tube Insertion Procedure Note  Indications:  Clinically significant Pneumothorax  Pre-operative Diagnosis: Pneumothorax  Post-operative Diagnosis: Pneumothorax  Procedure Details  Informed consent was obtained for the procedure, including sedation.  Risks of lung perforation, hemorrhage, arrhythmia, and adverse drug reaction were discussed.   After sterile skin prep, using standard technique, a 14 French tube was placed in the right lateral 8th rib space.  Findings: 15 ml of serosanguinous fluid obtained  Estimated Blood Loss:  Minimal         Specimens:  None              Complications:  None; patient tolerated the procedure well.         Disposition: ICU - intubated and critically ill.         Condition: stable  Attending Attestation: I performed the procedure.  Roselie Awkward, MD [place] [facility] Pager: [phone] Cell: [phone] If no response, call [phone]

## 2018-10-21 NOTE — Progress Notes (Signed)
[facility]  I called his nephew Roderic Palau in [place], was able to communicate about the chest tube but then the connection was lost.  Will continue to update.  Roselie Awkward, MD [place] [facility] Pager: [phone] Cell: [phone] If no response, call [phone]

## 2018-10-21 NOTE — Progress Notes (Signed)
Pharmacy Antibiotic Note  Thomas Gill is a 63 y.o. male admitted on 10/26/2018 with sepsis 2/2 HCAP. WBC  16.1 (steroids/shock), febrile (Tmax 100.4), lactate acid elevated at 3.4, CRP elevated to 31.1. Patient was started on CRRT on 9/21.  Pharmacy has been consulted for cefepime dosing.  Plan: Continue Cefepime 2gm q12h    - Monitor CRRT plans/renal function   - Culture susceptibilities   Height: 5\' 3"  (160 cm) Weight: 205 lb 7.5 oz (93.2 kg) IBW/kg (Calculated) : 56.9  Temp (24hrs), Avg:98.9 F (37.2 C), Min:96.8 F (36 C), Max:100.4 F (38 C)  Recent Labs  Lab 10/15/18 0440 10/16/18 0445 10/17/18 0350 10/18/18 0443  10/19/18 0430 10/19/18 1608 10/20/18 0456 10/20/18 1548 10/21/18 0530  WBC 20.7* 11.1* 13.4* 9.3  --   --   --  12.5*  --  16.1*  CREATININE 2.72* 3.80* 4.40* 4.58*   < > 2.94* 2.24* 1.96* 2.15* 2.28*  LATICACIDVEN 3.4*  --   --   --   --   --   --   --   --   --    < > = values in this interval not displayed.    Estimated Creatinine Clearance: 34.8 mL/min (A) (by C-G formula based on SCr of 2.28 mg/dL (H)).    No Known Allergies  Antimicrobials this admission: 9/14 Remdesivir >> 9/18 9/21 Vancomycin >>9/23 9/21 Cefepime >>  Microbiology results: 9/20 Bronchial: > 100K Proteus Mirabilis, >100K Staphylococcus aureus 9/15 MRSA PCR: Neg; 9/21:  9/14 Covid: positive   Thank you for allowing pharmacy to be a part of this patient's care.  Acey Lav, PharmD  PGY1 Acute Care Pharmacy Resident 10/21/2018 3:10 PM

## 2018-10-21 NOTE — Progress Notes (Signed)
  Patient remains proned. Head repositioned. No complications. RTx2, RNx2 at bedside.

## 2018-10-21 NOTE — Progress Notes (Signed)
[facility]PHROLOG[facility] PROGRESS NOTE  Assessment/ Plan: Pt is a 63 y.o. yo male with a COVID-19 presented with shortness of breath, cough diarrhea.  He had cardiopulmonary arrest on 9/17 with ROSC 9 minutes.  He is intubated and now with worsening renal failure and hyperkalemia.  #Nonoliguric AKI likely ATN after cardiac arrest and COVID-19 infection. Started CRRT on 10/18/2018. I spoke with patient's sister Thomas Gill who is in [place].  I used [employer] interpreter # [phone] and discussed at length.  Verbal consent obtained for catheter placement and dialysis treatment.   -Tolerating CRRT well. Patient is on 0K bath in prefilter and post filter.  Potassium remains elevated therefore I will increase the rate of of both prefilter and post filter to 500 cc.  Change UF 200 cc an hour.  Continue IV heparin for anticoagulation.  The filter was changed yesterday.  I have discussed with the bedside nurse. Ultrasound kidneys with large kidneys without obstruction. -Left IJ temporary catheter placed by PCCM on 9/21.  #Hyperkalemia: Changed CRRT prescription.  Also due to acidosis.  Monitor lab  #COVID-19 pneumonia/ARDS/vent dependent respiratory failure: Very high FiO2 with poor oxygenation.  Chest tube placed for pneumothorax by PCCM.  #Cardiac arrest on 9/17: 9 minutes ROSC.  #DVT: IV heparin, per pulmonary.  #Elevated LFTs, shock liver.  Subjective: Chart and lab results reviewed.  Discussed with patient's primary nurse.  Tolerating CRRT well.  No issue with catheter. Objective Vital signs in last 24 hours: Vitals:   10/21/18 0930 10/21/18 1000 10/21/18 1030 10/21/18 1100  BP: 110/70 123/70 133/69 131/74  Pulse: (!) 59 73 93 80  Resp:      Temp: 98.2 F (36.8 C) 98.1 F (36.7 C) 97.9 F (36.6 C) 97.7 F (36.5 C)  TempSrc:      SpO2: 92% (!) 86% 98% 99%  Weight:      Height:       Weight change: -2.055 kg  Intake/Output Summary (Last 24 hours) at 10/21/2018 1156 Last data  filed at 10/21/2018 1100 Gross per 24 hour  Intake 2802.79 ml  Output 5223 ml  Net -2420.21 ml       Labs: Basic Metabolic Panel: Recent Labs  Lab 10/20/18 0456 10/20/18 1548 10/20/18 1736 10/21/18 0441 10/21/18 0530  NA 137  139 134* 136 136 136  K 5.2*  4.9 5.3* 5.5* 5.3* 5.5*  CL 103 102  --   --  101  CO2 25 24  --   --  23  GLUCOSE 312* 259*  --   --  184*  BUN 75* 72*  --   --  80*  CREATININE 1.96* 2.15*  --   --  2.28*  CALCIUM 8.0* 7.8*  --   --  7.8*  PHOS 3.7 4.0  --   --  4.3   Liver Function Tests: Recent Labs  Lab 10/16/18 0445 10/17/18 0350 10/18/18 0443  10/20/18 0456 10/20/18 1548 10/21/18 0530  AST 56* 38 43*  --   --   --   --   ALT 69* 59* 47*  --   --   --   --   ALKPHOS 93 100 92  --   --   --   --   BILITOT 0.3 0.2* 0.5  --   --   --   --   PROT 5.2* 5.7* 5.8*  --   --   --   --   ALBUMIN 1.8* 1.9* 1.7*   < >  1.8* 1.9* 1.8*   < > = values in this interval not displayed.   No results for input(s): LIPASE, AMYLASE in the last 168 hours. No results for input(s): AMMONIA in the last 168 hours. CBC: Recent Labs  Lab 10/14/18 2325  10/16/18 0445  10/17/18 0350 10/18/18 0443  10/20/18 0456 10/20/18 1736 10/21/18 0441 10/21/18 0530  WBC 19.7*   < > 11.1*  --  13.4* 9.3  --  12.5*  --   --  16.1*  NEUTROABS 12.7*  --   --   --   --   --   --   --   --   --   --   HGB 12.8*   < > 11.7*   < > 11.9* 11.1*   < > 11.3*  20.1* 12.6* 12.9* 11.5*  HCT 45.6   < > 38.3*   < > 38.6* 36.8*   < > 37.4*  59.0* 37.0* 38.0* 37.7*  MCV 109.4*   < > 100.5*  --  99.0 100.0  --  100.5*  --   --  100.3*  PLT 209   < > 193  --  232 171  --  176  --   --  177   < > = values in this interval not displayed.   Cardiac Enzymes: No results for input(s): CKTOTAL, CKMB, CKMBINDEX, TROPONINI in the last 168 hours. CBG: Recent Labs  Lab 10/20/18 1626 10/20/18 2021 10/20/18 2312 10/21/18 0330 10/21/18 0737  GLUCAP 305* 224* 266* 146* 182*    Iron  Studies: No results for input(s): IRON, TIBC, TRANSFERRIN, FERRITIN in the last 72 hours. Studies/Results: Dg Chest Port 1 View  Result Date: 10/21/2018 CLINICAL DATA:  Right pneumothorax. Status post chest tube placement. EXAM: PORTABLE CHEST 1 VIEW 10:29 a.m. COMPARISON:  10/21/2018 at 9:41 a.m. FINDINGS: Pigtail catheter has been inserted in the right hemithorax. Almost complete resolution of the right pneumothorax with a tiny residual apical component. New small amount of subcutaneous edema in the right lateral chest wall. Endotracheal tube 4.2 cm above the carina. Central catheter tip is in the superior vena cava at the level of the azygos vein, unchanged. Heart size and vascularity are normal. There is some residual haziness in the right lung with slight atelectasis at the right lung base. The left lung is clear. Feeding tube tip is below the diaphragm. IMPRESSION: 1. Almost complete resolution of the right pneumothorax after placement of pigtail catheter. 2. New small amount of subcutaneous edema in the right lateral chest wall. 3. Slight atelectasis at the right lung base. Electronically Signed   By: Lorriane Shire M.D.   On: 10/21/2018 10:49   Dg Chest Port 1 View  Result Date: 10/21/2018 CLINICAL DATA:  Respiratory failure EXAM: PORTABLE CHEST 1 VIEW COMPARISON:  10/18/2018 FINDINGS: There is a new, large right pneumothorax, approximately 75% volume, with total atelectasis of the right lung. There is depression of the right costophrenic recess and leftward shift of the mediastinum, features concerning for tension. There is unchanged diffuse interstitial opacity of the left lung. Right neck vascular catheter has been removed. Endotracheal tube, left neck vascular catheter, and partially imaged enteric feeding tube are unchanged. Cardiomegaly. IMPRESSION: There is a new, large right pneumothorax, approximately 75% volume, with total atelectasis of the right lung. There is depression of the right  costophrenic recess and leftward shift of the mediastinum, features concerning for tension. These results were called by telephone at the time of interpretation on  10/21/2018 at 10:07 am to Jenkins Rouge, RN, on behalf of Dr. [name], who verbally acknowledged these results. Electronically Signed   By: Eddie Candle M.D.   On: 10/21/2018 10:20    Medications: Infusions: . sodium chloride 10 mL/hr at 10/21/18 1100  . ceFEPime (MAXIPIME) IV 2 g (10/21/18 0039)  . feeding supplement (VITAL 1.5 CAL) 1,000 mL (10/19/18 1730)  . fentaNYL infusion INTRAVENOUS 300 mcg/hr (10/21/18 1100)  . heparin 1,500 Units/hr (10/21/18 1100)  . midazolam 6 mg/hr (10/21/18 1100)  . norepinephrine (LEVOPHED) Adult infusion Stopped (10/16/18 0111)  . prismasol BGK 0/2.5 300 mL/hr at 10/21/18 0041  . prismasol BGK 0/2.5 400 mL/hr at 10/21/18 0852  . prismasol BGK 4/2.5 1,500 mL/hr at 10/21/18 9937    Scheduled Medications: . artificial tears  1 application Both Eyes J6R  . chlorhexidine gluconate (MEDLINE KIT)  15 mL Mouth Rinse BID  . Chlorhexidine Gluconate Cloth  6 each Topical Daily  . dexamethasone (DECADRON) injection  6 mg Intravenous Q12H  . famotidine  20 mg Per Tube QHS  . feeding supplement (PRO-STAT SUGAR FREE 64)  60 mL Per Tube TID  . insulin aspart  0-20 Units Subcutaneous Q4H  . insulin aspart  3 Units Subcutaneous Q4H  . insulin detemir  25 Units Subcutaneous BID  . mouth rinse  15 mL Mouth Rinse 10 times per day  . multivitamin with minerals  1 tablet Per Tube Daily  . oxyCODONE  5 mg Per Tube Q6H  . polyethylene glycol  17 g Per Tube BID  . sennosides  5 mL Per Tube BID  . sodium chloride flush  10-40 mL Intracatheter Q12H    have reviewed scheduled and prn medications.  Thomas Gill 10/21/2018,11:56 AM  LOS: 10 days  Pager: [phone]

## 2018-10-21 NOTE — Progress Notes (Signed)
NAMERaymir Gill, MRN:  [id number], DOB:  10/22/1955, LOS: 86 ADMISSION DATE:  10/23/2018, CONSULTATION DATE:  9/15 REFERRING MD:  Ashok Cordia, CHIEF COMPLAINT:  Dyspnea   Brief History   63 y/o male with DM2 admitted with COVID Pneumonia causing severe acute respiratory failure with hypoxemia.  Intubated on 9/16  Past Medical History  Former smoker DM2 Obesity  [facility] Events   9/14 admission 9/17 intubated, proned.  Cardiac arrest later in day while he was in prone position.  Central line placed.  Started on Levophed drip 9/18- Paralyzed for vent dysynchrony 9/19- Started heparin for DVT 9/20-bronchoscopy for mucous plugging, left hemithorax opacification 9/21-starting CVVH, restart proning 9/24 R pneumothorax  Consults:  PCCM Nephrology  Procedures:  ETT 9/17>>> Femoral CVL 9/17 >> 9/20 Lt IJ CVL 9/20 >> Rt fem a line 9/21 >> Rt IL HD cath 9/21 >>  Significant Diagnostic Tests:  Lower extremity duplex 9/19> age indeterminate bilateral DVT  Micro Data:  9/14 SARS COV 2 positive BAL 9/20 >> proteus BAL 9/20 AFB, Fungus >   Antimicrobials/COVID Rx  9/14 Decadron >  9/14 Remdesivir >  9/18  Vancomycin 9/21> 9/22 Cefepime 9/21>  Interim history/subjective:   Turned back to prone position Severe hypoxemia yesterday   Objective   Blood pressure 123/70, pulse 73, temperature 98.1 F (36.7 C), resp. rate (!) 35, height _0  (1.6 m), weight 93.2 kg, SpO2 (!) 86 %. CVP:  [10 mmHg-23 mmHg] 17 mmHg  Vent Mode: PRVC FiO2 (%):  [80 %-100 %] 100 % Set Rate:  [35 bmp] 35 bmp Vt Set:  [450 mL] 450 mL PEEP:  [12 cmH20-14 cmH20] 14 cmH20 Plateau Pressure:  [29 cmH20-40 cmH20] 40 cmH20   Intake/Output Summary (Last 24 hours) at 10/21/2018 1033 Last data filed at 10/21/2018 1000 Gross per 24 hour  Intake 2903.5 ml  Output 5177 ml  Net -2273.5 ml   Filed Weights   10/19/18 0500 10/20/18 0500 10/21/18 0500  Weight: 98.4 kg 95.3 kg 93.2 kg     Examination:  General:  In bed on vent HENT: NCAT ETT in place PULM: wheeze, poor air movement bilaterally, vent supported breathing CV: RRR, no mgr GI: BS+, soft, nontender MSK: normal bulk and tone Neuro: sedated on vent   9/24 CXR images reviewed: large R pneumothorax  Resolved Hospital Problem list     Assessment & Plan:  ARDS due to COVID 19 pneumonia: no significant change in hypoxemia 9/24 new right pneumothorax Continue mechanical ventilation per ARDS protocol Target TVol 6-8cc/kgIBW Target Plateau Pressure < 30cm H20 Target driving pressure less than 15 cm of water Target PaO2 55-65: titrate PEEP/FiO2 per protocol As long as PaO2 to FiO2 ratio is less than 1:150 position in prone position for 16 hours a day Check CVP daily if CVL in place Target CVP less than 4, diurese as necessary Ventilator associated pneumonia prevention protocol 9/24: place chest tube on right, repeat CXR, may need bronchoscopy on R if lung not coming up, continue ARDS protocol  AKI, oliguric CVVHD per renal Monitor BMET and UOP Replace electrolytes as needed   HCAP: proteus Continue cefepime  Cardiac arrest, PE? Tele  Lower extremity DVT Heparin infusion  Need for sedation for mechanical ventilation RASS target -4 to -5 Intermittent vecuronium protocol  Shock liver Monitor LFT  Hyperglycemia: Levemir/SSI per [facility]  Best practice:  Diet: tube feeding Pain/Anxiety/Delirium protocol (if indicated): as above VAP protocol (if indicated): yes DVT prophylaxis: heparin infusion GI prophylaxis: famotidine  Glucose control: SSI Mobility: bed rest Code Status: full Family Communication: I will call Dwana Curd in [place] [phone]) for an update today Disposition: remain in ICU  Labs   CBC: Recent Labs  Lab 10/14/18 2325  10/16/18 0445  10/17/18 0350 10/18/18 0443  10/19/18 1955 10/20/18 0456 10/20/18 1736 10/21/18 0441 10/21/18 0530  WBC 19.7*   < > 11.1*   --  13.4* 9.3  --   --  12.5*  --   --  16.1*  NEUTROABS 12.7*  --   --   --   --   --   --   --   --   --   --   --   HGB 12.8*   < > 11.7*   < > 11.9* 11.1*   < > 11.9* 11.3*  20.1* 12.6* 12.9* 11.5*  HCT 45.6   < > 38.3*   < > 38.6* 36.8*   < > 35.0* 37.4*  59.0* 37.0* 38.0* 37.7*  MCV 109.4*   < > 100.5*  --  99.0 100.0  --   --  100.5*  --   --  100.3*  PLT 209   < > 193  --  232 171  --   --  176  --   --  177   < > = values in this interval not displayed.    Basic Metabolic Panel: Recent Labs  Lab 10/17/18 0350 10/18/18 0443  10/19/18 0430  10/19/18 1608  10/20/18 0456 10/20/18 1548 10/20/18 1736 10/21/18 0441 10/21/18 0530  NA 143 144   < > 143   < > 139   < > 137  139 134* 136 136 136  K 5.6* 6.0*   < > 5.4*   < > 5.2*   < > 5.2*  4.9 5.3* 5.5* 5.3* 5.5*  CL 104 106   < > 106  --  104  --  103 102  --   --  101  CO2 29 29   < > 28  --  26  --  25 24  --   --  23  GLUCOSE 267* 365*   < > 193*  --  209*  --  312* 259*  --   --  184*  BUN 108* 144*   < > 98*  --  82*  --  75* 72*  --   --  80*  CREATININE 4.40* 4.58*   < > 2.94*  --  2.24*  --  1.96* 2.15*  --   --  2.28*  CALCIUM 7.9* 7.8*   < > 8.0*  --  7.8*  --  8.0* 7.8*  --   --  7.8*  MG 2.5* 2.5*  --  2.6*  --   --   --  2.5*  --   --   --  2.9*  PHOS 4.6 3.7   < > 4.1  --  2.6  --  3.7 4.0  --   --  4.3   < > = values in this interval not displayed.   GFR: Estimated Creatinine Clearance: 34.8 mL/min (A) (by C-G formula based on SCr of 2.28 mg/dL (H)). Recent Labs  Lab 10/15/18 0440  10/17/18 0350 10/18/18 0443 10/20/18 0456 10/21/18 0530  WBC 20.7*   < > 13.4* 9.3 12.5* 16.1*  LATICACIDVEN 3.4*  --   --   --   --   --    < > = values  in this interval not displayed.    Liver Function Tests: Recent Labs  Lab 10/15/18 0440 10/16/18 0445 10/17/18 0350 10/18/18 0443  10/19/18 0430 10/19/18 1608 10/20/18 0456 10/20/18 1548 10/21/18 0530  AST 139* 56* 38 43*  --   --   --   --   --   --   ALT  101* 69* 59* 47*  --   --   --   --   --   --   ALKPHOS 122 93 100 92  --   --   --   --   --   --   BILITOT 0.8 0.3 0.2* 0.5  --   --   --   --   --   --   PROT 5.7* 5.2* 5.7* 5.8*  --   --   --   --   --   --   ALBUMIN 2.1* 1.8* 1.9* 1.7*   < > 1.9* 1.7* 1.8* 1.9* 1.8*   < > = values in this interval not displayed.   No results for input(s): LIPASE, AMYLASE in the last 168 hours. No results for input(s): AMMONIA in the last 168 hours.  ABG    Component Value Date/Time   PHART 7.275 (L) 10/21/2018 0441   PCO2ART 53.6 (H) 10/21/2018 0441   PO2ART 60.0 (L) 10/21/2018 0441   HCO3 25.0 10/21/2018 0441   TCO2 27 10/21/2018 0441   ACIDBASEDEF 3.0 (H) 10/21/2018 0441   O2SAT 87.0 10/21/2018 0441     Coagulation Profile: No results for input(s): INR, PROTIME in the last 168 hours.  Cardiac Enzymes: No results for input(s): CKTOTAL, CKMB, CKMBINDEX, TROPONINI in the last 168 hours.  HbA1C: Hgb A1c MFr Bld  Date/Time Value Ref Range Status  10/10/2018 02:30 PM 9.1 (H) 4.8 - 5.6 % Final    Comment:    (NOTE)         Prediabetes: 5.7 - 6.4         Diabetes: >6.4         Glycemic control for adults with diabetes: <7.0     CBG: Recent Labs  Lab 10/20/18 1626 10/20/18 2021 10/20/18 2312 10/21/18 0330 10/21/18 0737  GLUCAP 305* 224* 266* 146* 182*     Critical care time: 45 minutes    Roselie Awkward, MD [place] [facility] Pager: [phone] Cell: [phone] If no response, call [phone]

## 2018-10-21 NOTE — Progress Notes (Signed)
ANTICOAGULATION CONSULT NOTE - Follow Up Consult  Pharmacy Consult for Heparin Indication: Suspected PE, Bilateral DVTs, CRRT anticoagulation  No Known Allergies  Patient Measurements: Height: 5\' 3"  (160 cm) Weight: 205 lb 7.5 oz (93.2 kg) IBW/kg (Calculated) : 56.9 Heparin Dosing Weight: 79.5 kg  Vital Signs: Temp: 97.6 F (36.4 C) (09/24 1600) Temp Source: Axillary (09/24 1600) BP: 102/70 (09/24 1600) Pulse Rate: 54 (09/24 1600)  Labs: Recent Labs    10/19/18 0430  10/20/18 0456  10/20/18 1548 10/20/18 1736 10/20/18 2015 10/21/18 0441 10/21/18 0530 10/21/18 0956 10/21/18 1550  HGB  --    < > 11.3*  20.1*  --   --  12.6*  --  12.9* 11.5*  --   --   HCT  --    < > 37.4*  59.0*  --   --  37.0*  --  38.0* 37.7*  --   --   PLT  --   --  176  --   --   --   --   --  177  --   --   APTT 61*  --  48*  --   --   --   --   --  68*  --   --   HEPARINUNFRC  --    < >  --    < >  --   --  0.26*  --   --  0.50 0.58  CREATININE 2.94*   < > 1.96*  --  2.15*  --   --   --  2.28*  --  2.08*   < > = values in this interval not displayed.    Estimated Creatinine Clearance: 38.1 mL/min (A) (by C-G formula based on SCr of 2.08 mg/dL (H)).   Medications:  Infusions:  . sodium chloride 10 mL/hr at 10/21/18 1600  . ceFEPime (MAXIPIME) IV Stopped (10/21/18 1411)  . feeding supplement (VITAL 1.5 CAL) 1,000 mL (10/19/18 1730)  . fentaNYL infusion INTRAVENOUS 250 mcg/hr (10/21/18 1600)  . heparin 1,500 Units/hr (10/21/18 1653)  . midazolam 5 mg/hr (10/21/18 1600)  . norepinephrine (LEVOPHED) Adult infusion Stopped (10/16/18 0111)  . prismasol BGK 0/2.5 500 mL/hr at 10/21/18 1210  . prismasol BGK 0/2.5 500 mL/hr at 10/21/18 1210  . prismasol BGK 4/2.5 1,500 mL/hr at 10/21/18 1543    Assessment: Thomas Gill is a 63 y.o. male presented on 9/14 with worsening O2 saturation, cough, diarrhea, COVID-19 positive.  On 9/17, patient decompensated requiring intubation. On 9/18 patient had  acardiorespiratory arrest/code blueevent that lasted approximately 9 minutes until achieving ROSC.  9/18 Vascular US positive for bilateral DVTs.  Pharmacy is consulted to dose heparin for DVT and suspected PE.  Heparin was paused on 9/19 given oral bleeding which has resolved. Per MD, okay to resume IV heparin at 1800 on 9/20.  Today, 10/21/2018:  Heparin level is therapuetic at 0.50 on heparin at 1500 units/hr  CBC stable:  Hgb 11.5, Plt 177  Per RN no line or bleeding issues with pt's heparin.   Continues on CRRT.  Heparin in CRRT circuit was stopped on 9/22 when systemic heparin reached therapeutic levels.  Despite several sub-therapeutic systemic heparin levels, the RN confirms no CRRT clotting or increased filter pressures.    His heparin was finally at goal but it trended up slightly above goal at 0.58 tonight. Due to his risk of previous bleeding. We will reduce it slightly.   Goal of Therapy:  Heparin level 0.3-0.5 units/ml  Monitor platelets by anticoagulation protocol: Yes   Plan:  Decrease heparin to 1450 units/hr Check 8 hr HL Monitor daily heparin level, CBC, s/s of bleed  Ulyses Southward, PharmD, BCIDP, AAHIVP, CPP Infectious Disease Pharmacist 10/21/2018 5:05 PM

## 2018-10-22 ENCOUNTER — Inpatient Hospital Stay (HOSPITAL_COMMUNITY): Payer: Medicaid Other

## 2018-10-22 DIAGNOSIS — N179 Acute kidney failure, unspecified: Secondary | ICD-10-CM | POA: Diagnosis not present

## 2018-10-22 DIAGNOSIS — J9601 Acute respiratory failure with hypoxia: Secondary | ICD-10-CM | POA: Diagnosis not present

## 2018-10-22 DIAGNOSIS — U071 COVID-19: Secondary | ICD-10-CM | POA: Diagnosis not present

## 2018-10-22 DIAGNOSIS — I469 Cardiac arrest, cause unspecified: Secondary | ICD-10-CM | POA: Diagnosis not present

## 2018-10-22 LAB — POCT I-STAT 7, (LYTES, BLD GAS, ICA,H+H)
Acid-base deficit: 4 mmol/L — ABNORMAL HIGH (ref 0.0–2.0)
Acid-base deficit: 3 mmol/L — ABNORMAL HIGH (ref 0.0–2.0)
Bicarbonate: 24.8 mmol/L (ref 20.0–28.0)
Bicarbonate: 24.6 mmol/L (ref 20.0–28.0)
Calcium, Ion: 1.18 mmol/L (ref 1.15–1.40)
Calcium, Ion: 1.12 mmol/L — ABNORMAL LOW (ref 1.15–1.40)
HCT: 39 % (ref 39.0–52.0)
HCT: 36 % — ABNORMAL LOW (ref 39.0–52.0)
Hemoglobin: 13.3 g/dL (ref 13.0–17.0)
Hemoglobin: 12.2 g/dL — ABNORMAL LOW (ref 13.0–17.0)
O2 Saturation: 92 %
O2 Saturation: 94 %
Patient temperature: 97.8
Patient temperature: 98.3
Potassium: 5.2 mmol/L — ABNORMAL HIGH (ref 3.5–5.1)
Potassium: 5.1 mmol/L (ref 3.5–5.1)
Sodium: 133 mmol/L — ABNORMAL LOW (ref 135–145)
Sodium: 133 mmol/L — ABNORMAL LOW (ref 135–145)
TCO2: 27 mmol/L (ref 22–32)
TCO2: 26 mmol/L (ref 22–32)
pCO2 arterial: 62.9 mmHg — ABNORMAL HIGH (ref 32.0–48.0)
pCO2 arterial: 56.2 mmHg — ABNORMAL HIGH (ref 32.0–48.0)
pH, Arterial: 7.201 — ABNORMAL LOW (ref 7.350–7.450)
pH, Arterial: 7.247 — ABNORMAL LOW (ref 7.350–7.450)
pO2, Arterial: 76 mmHg — ABNORMAL LOW (ref 83.0–108.0)
pO2, Arterial: 83 mmHg (ref 83.0–108.0)

## 2018-10-22 LAB — RENAL FUNCTION PANEL
Albumin: 2 g/dL — ABNORMAL LOW (ref 3.5–5.0)
Albumin: 1.9 g/dL — ABNORMAL LOW (ref 3.5–5.0)
Anion gap: 9 (ref 5–15)
Anion gap: 13 (ref 5–15)
BUN: 66 mg/dL — ABNORMAL HIGH (ref 6–20)
BUN: 65 mg/dL — ABNORMAL HIGH (ref 6–20)
CO2: 26 mmol/L (ref 22–32)
CO2: 18 mmol/L — ABNORMAL LOW (ref 22–32)
Calcium: 8.4 mg/dL — ABNORMAL LOW (ref 8.9–10.3)
Calcium: 8.1 mg/dL — ABNORMAL LOW (ref 8.9–10.3)
Chloride: 100 mmol/L (ref 98–111)
Chloride: 102 mmol/L (ref 98–111)
Creatinine, Ser: 2.02 mg/dL — ABNORMAL HIGH (ref 0.61–1.24)
Creatinine, Ser: 2.04 mg/dL — ABNORMAL HIGH (ref 0.61–1.24)
GFR calc Af Amer: 40 mL/min — ABNORMAL LOW (ref >60)
GFR calc Af Amer: 40 mL/min — ABNORMAL LOW (ref >60)
GFR calc non Af Amer: 35 mL/min — ABNORMAL LOW (ref >60)
GFR calc non Af Amer: 34 mL/min — ABNORMAL LOW (ref >60)
Glucose, Bld: 236 mg/dL — ABNORMAL HIGH (ref 70–99)
Glucose, Bld: 296 mg/dL — ABNORMAL HIGH (ref 70–99)
Phosphorus: 5.1 mg/dL — ABNORMAL HIGH (ref 2.5–4.6)
Phosphorus: 3.8 mg/dL (ref 2.5–4.6)
Potassium: 5.2 mmol/L — ABNORMAL HIGH (ref 3.5–5.1)
Potassium: 5.2 mmol/L — ABNORMAL HIGH (ref 3.5–5.1)
Sodium: 135 mmol/L (ref 135–145)
Sodium: 133 mmol/L — ABNORMAL LOW (ref 135–145)

## 2018-10-22 LAB — CBC
HCT: 40.1 % (ref 39.0–52.0)
Hemoglobin: 12 g/dL — ABNORMAL LOW (ref 13.0–17.0)
MCH: 30.5 pg (ref 26.0–34.0)
MCHC: 29.9 g/dL — ABNORMAL LOW (ref 30.0–36.0)
MCV: 102 fL — ABNORMAL HIGH (ref 80.0–100.0)
Platelets: 180 10*3/uL (ref 150–400)
RBC: 3.93 MIL/uL — ABNORMAL LOW (ref 4.22–5.81)
RDW: 14.8 % (ref 11.5–15.5)
WBC: 20.3 10*3/uL — ABNORMAL HIGH (ref 4.0–10.5)
nRBC: 5.1 % — ABNORMAL HIGH (ref 0.0–0.2)

## 2018-10-22 LAB — GLUCOSE, CAPILLARY
Glucose-Capillary: 221 mg/dL — ABNORMAL HIGH (ref 70–99)
Glucose-Capillary: 226 mg/dL — ABNORMAL HIGH (ref 70–99)
Glucose-Capillary: 261 mg/dL — ABNORMAL HIGH (ref 70–99)
Glucose-Capillary: 282 mg/dL — ABNORMAL HIGH (ref 70–99)
Glucose-Capillary: 299 mg/dL — ABNORMAL HIGH (ref 70–99)
Glucose-Capillary: 308 mg/dL — ABNORMAL HIGH (ref 70–99)
Glucose-Capillary: 324 mg/dL — ABNORMAL HIGH (ref 70–99)

## 2018-10-22 LAB — HEPATIC FUNCTION PANEL
ALT: 112 U/L — ABNORMAL HIGH (ref 0–44)
AST: 96 U/L — ABNORMAL HIGH (ref 15–41)
Albumin: 2 g/dL — ABNORMAL LOW (ref 3.5–5.0)
Alkaline Phosphatase: 147 U/L — ABNORMAL HIGH (ref 38–126)
Bilirubin, Direct: 0.1 mg/dL (ref 0.0–0.2)
Indirect Bilirubin: 0.4 mg/dL (ref 0.3–0.9)
Total Bilirubin: 0.5 mg/dL (ref 0.3–1.2)
Total Protein: 7.5 g/dL (ref 6.5–8.1)

## 2018-10-22 LAB — CULTURE, BAL-QUANTITATIVE W GRAM STAIN: Culture: >=100000 — AB

## 2018-10-22 LAB — HEPARIN LEVEL (UNFRACTIONATED): Heparin Unfractionated: 0.39 IU/mL (ref 0.30–0.70)

## 2018-10-22 LAB — MAGNESIUM: Magnesium: 2.8 mg/dL — ABNORMAL HIGH (ref 1.7–2.4)

## 2018-10-22 LAB — APTT: aPTT: 66 seconds — ABNORMAL HIGH (ref 24–36)

## 2018-10-22 MED ORDER — NOREPINEPHRINE 16 MG/250ML-% IV SOLN
0.0000 ug/min | INTRAVENOUS | Status: DC
  Administered 2018-10-22 (×3): 2 ug/min via INTRAVENOUS
  Administered 2018-10-24: 18:00:00 39 ug/min via INTRAVENOUS
  Administered 2018-10-25: 29 ug/min via INTRAVENOUS
  Administered 2018-10-25: 36 ug/min via INTRAVENOUS
  Filled 2018-10-22 (×6): qty 250

## 2018-10-22 MED ORDER — ALBUTEROL SULFATE (2.5 MG/3ML) 0.083% IN NEBU
2.5000 mg | INHALATION_SOLUTION | Freq: Four times a day (QID) | RESPIRATORY_TRACT | Status: DC
  Administered 2018-10-22 – 2018-10-25 (×12): 2.5 mg via RESPIRATORY_TRACT
  Filled 2018-10-22 (×12): qty 3

## 2018-10-22 MED ORDER — ACETAMINOPHEN 325 MG PO TABS: 650.0000 mg | ORAL_TABLET | ORAL | Status: DC | PRN

## 2018-10-22 MED ORDER — ACETYLCYSTEINE 20 % IN SOLN
4.0000 mL | Freq: Four times a day (QID) | RESPIRATORY_TRACT | Status: AC
  Administered 2018-10-22 – 2018-10-24 (×8): 4 mL via RESPIRATORY_TRACT
  Filled 2018-10-22 (×8): qty 4

## 2018-10-22 MED ORDER — INSULIN DETEMIR 100 UNIT/ML ~~LOC~~ SOLN
38.0000 [IU] | Freq: Two times a day (BID) | SUBCUTANEOUS | Status: DC
  Administered 2018-10-22 – 2018-10-24 (×4): 38 [IU] via SUBCUTANEOUS
  Filled 2018-10-22 (×5): qty 0.38

## 2018-10-22 MED ORDER — CEFAZOLIN SODIUM-DEXTROSE 2-4 GM/100ML-% IV SOLN
2.0000 g | Freq: Two times a day (BID) | INTRAVENOUS | Status: AC
  Administered 2018-10-22 – 2018-10-25 (×6): 2 g via INTRAVENOUS
  Filled 2018-10-22 (×6): qty 100

## 2018-10-22 NOTE — Progress Notes (Signed)
Pharmacy Antibiotic Note  Thomas Gill is a 63 y.o. male admitted on 10/08/2018 with sepsis 2/2 HCAP. WBC 20.3 (steroids/shock), afebrile, lactate acid elevated at 3.4, CRP elevated to 31.1. Patient was started on CRRT on 9/21.  Pharmacy has been consulted for cefazolin dosing.  Plan: Discontinue Cefepime 2gm q12h  Start Cefazolin 2 gm IV q12h   - Monitor CRRT plans/renal function  Height: 5\' 3"  (160 cm) Weight: 201 lb 4.5 oz (91.3 kg) IBW/kg (Calculated) : 56.9  Temp (24hrs), Avg:97.7 F (36.5 C), Min:96.7 F (35.9 C), Max:98.3 F (36.8 C)  Recent Labs  Lab 10/17/18 0350 10/18/18 0443  10/20/18 0456 10/20/18 1548 10/21/18 0530 10/21/18 1550 10/22/18 0435  WBC 13.4* 9.3  --  12.5*  --  16.1*  --  20.3*  CREATININE 4.40* 4.58*   < > 1.96* 2.15* 2.28* 2.08* 2.02*   < > = values in this interval not displayed.    Estimated Creatinine Clearance: 38.9 mL/min (A) (by C-G formula based on SCr of 2.02 mg/dL (H)).    No Known Allergies  Antimicrobials this admission: 9/14 Remdesivir >> 9/18 9/21 Vancomycin >>9/23 9/21 Cefepime >> 9/25 9/25 Cefazolin >>   Microbiology results: 9/20 Bronchial: > 100K Proteus Mirabilis, >100K Staphylococcus aureus 9/15 MRSA PCR: Neg; 9/21:  9/14 Covid: positive   Thank you for allowing pharmacy to be a part of this patient's care.  Acey Lav, PharmD  PGY1 Acute Care Pharmacy Resident 10/22/2018 4:12 PM

## 2018-10-22 NOTE — Procedures (Signed)
Hemodialysis Catheter Insertion Procedure Note Ryley Bachtel [id number] 07-Aug-1955  Procedure: Insertion of Central Venous Catheter Indications: Need for hemodialysis, need to remove femoral HD catheter  Procedure Details Consent: Risks of procedure as well as the alternatives and risks of each were explained to the (patient/caregiver).  Consent for procedure obtained. Time Out: Verified patient identification, verified procedure, site/side was marked, verified correct patient position, special equipment/implants available, medications/allergies/relevent history reviewed, required imaging and test results available.  Performed  Maximum sterile technique was used including antiseptics, cap, gloves, gown, hand hygiene, mask and sheet. Skin prep: Chlorhexidine; local anesthetic administered A antimicrobial bonded/coated triple lumen catheter was placed in the right internal jugular vein using the Seldinger technique.  Ultrasound was used to verify the patency of the vein and for real time needle guidance.  Evaluation Blood flow good Complications: No apparent complications Patient did tolerate procedure well. Chest X-ray ordered to verify placement.  CXR: pending.  [name] 10/22/2018, 11:58 AM

## 2018-10-22 NOTE — Progress Notes (Signed)
Thomas Gill, MRN:  [id number], DOB:  09/24/1955, LOS: 16 ADMISSION DATE:  10/27/2018, CONSULTATION DATE:  9/15 REFERRING MD:  Ashok Cordia, CHIEF COMPLAINT:  Dyspnea   Brief History   63 y/o male with DM2 admitted with COVID Pneumonia causing severe acute respiratory failure with hypoxemia.  Intubated on 9/16  Past Medical History  Former smoker DM2 Obesity  [facility] Events   9/14 admission 9/17 intubated, proned.  Cardiac arrest later in day while he was in prone position.  Central line placed.  Started on Levophed drip 9/18- Paralyzed for vent dysynchrony 9/19- Started heparin for DVT 9/20-bronchoscopy for mucous plugging, left hemithorax opacification 9/21-starting CVVH, restart proning 9/24 R pneumothorax  Consults:  PCCM Nephrology  Procedures:  ETT 9/17>>> Femoral CVL 9/17 >> 9/20 Lt IJ CVL 9/20 >> Rt fem a line 9/21 >> Rt IL HD cath 9/21 >> Right chest tube 9/24 >>  Significant Diagnostic Tests:  Lower extremity duplex 9/19> age indeterminate bilateral DVT  Micro Data:  9/14 SARS COV 2 positive BAL 9/20 >> proteus BAL 9/20 AFB, Fungus >   Antimicrobials/COVID Rx  9/14 Decadron >  9/14 Remdesivir >  9/18  Vancomycin 9/21> 9/22 Cefepime 9/21>  Interim history/subjective:   Chest tube placed for right pneumothorax  Objective   Blood pressure 101/76, pulse 61, temperature (!) 96.8 F (36 C), temperature source Axillary, resp. rate (!) 35, height _0  (1.6 m), weight 91.3 kg, SpO2 100 %. CVP:  [7 mmHg-23 mmHg] 14 mmHg  Vent Mode: PCV FiO2 (%):  [60 %-100 %] 60 % Set Rate:  [30 bmp-35 bmp] 35 bmp PEEP:  [14 cmH20] 14 cmH20 Plateau Pressure:  [28 cmH20-39 cmH20] 32 cmH20   Intake/Output Summary (Last 24 hours) at 10/22/2018 8242 Last data filed at 10/22/2018 0800 Gross per 24 hour  Intake 3271.73 ml  Output 5681 ml  Net -2409.27 ml   Filed Weights   10/20/18 0500 10/21/18 0500 10/22/18 0300  Weight: 95.3 kg 93.2 kg 91.3 kg     Examination:  General:  In bed on vent HENT: NCAT ETT in place PULM: CTA B, vent supported breathing CV: RRR, no mgr GI: BS+, soft, nontender MSK: normal bulk and tone Neuro: sedated on vent   9/24 CXR images reviewed: large R pneumothorax 9/25 CXR images reviewed: bilateral air space disease, R chest tube in place, ETT in place  Resolved Hospital Problem list     Assessment & Plan:  ARDS due to COVID 19 pneumonia: no significant change in hypoxemia 9/24 new right pneumothorax Continue mechanical ventilation per ARDS protocol Target TVol 6-8cc/kgIBW Target Plateau Pressure < 30cm H20 Target driving pressure less than 15 cm of water Target PaO2 55-65: titrate PEEP/FiO2 per protocol As long as PaO2 to FiO2 ratio is less than 1:150 position in prone position for 16 hours a day Check CVP daily if CVL in place Target CVP less than 4, diurese as necessary Ventilator associated pneumonia prevention protocol 9/25: continue chest tube, change to water seal; remove volume with CVVHD, OK to use levophed if needed for volume removal  AKI, oliguric CVVHD per renal Change HD cath from femoral site to R IJ again today for infection prevention Monitor BMET and UOP Replace electrolytes as needed  HCAP: proteus Continue cefepime  Cardiac arrest, PE? Tele  Lower extremity DVT Heparin infusion  Need for sedation for mechanical ventilation RASS target -4 to -5 Intermittent vecuronium protocol  Shock liver Monitor LFT  Hyperglycemia: Levemir/SSI per [facility]  Best practice:  Diet: tube feeding Pain/Anxiety/Delirium protocol (if indicated): as above VAP protocol (if indicated): yes DVT prophylaxis: heparin infusion GI prophylaxis: famotidine Glucose control: SSI Mobility: bed rest Code Status: full Family Communication: I called his nephew in [place] Jonathan Garcia [phone]) today; He told me that the patient has a daughter, Carvel Getting [phone]) Disposition:  remain in ICU  Labs   CBC: Recent Labs  Lab 10/17/18 0350 10/18/18 0443  10/20/18 0456  10/21/18 0441 10/21/18 0530 10/21/18 1713 10/22/18 0349 10/22/18 0435  WBC 13.4* 9.3  --  12.5*  --   --  16.1*  --   --  20.3*  HGB 11.9* 11.1*   < > 11.3*  20.1*   < > 12.9* 11.5* 12.6* 13.3 12.0*  HCT 38.6* 36.8*   < > 37.4*  59.0*   < > 38.0* 37.7* 37.0* 39.0 40.1  MCV 99.0 100.0  --  100.5*  --   --  100.3*  --   --  102.0*  PLT 232 171  --  176  --   --  177  --   --  180   < > = values in this interval not displayed.    Basic Metabolic Panel: Recent Labs  Lab 10/18/18 0443  10/19/18 0430  10/20/18 0456 10/20/18 1548  10/21/18 0530 10/21/18 1550 10/21/18 1713 10/22/18 0349 10/22/18 0435  NA 144   < > 143   < > 137  139 134*   < > 136 136 131* 133* 135  K 6.0*   < > 5.4*   < > 5.2*  4.9 5.3*   < > 5.5* 5.5* 4.7 5.2* 5.2*  CL 106   < > 106   < > 103 102  --  101 101  --   --  100  CO2 29   < > 28   < > 25 24  --  23 21*  --   --  26  GLUCOSE 365*   < > 193*   < > 312* 259*  --  184* 296*  --   --  236*  BUN 144*   < > 98*   < > 75* 72*  --  80* 71*  --   --  66*  CREATININE 4.58*   < > 2.94*   < > 1.96* 2.15*  --  2.28* 2.08*  --   --  2.02*  CALCIUM 7.8*   < > 8.0*   < > 8.0* 7.8*  --  7.8* 8.0*  --   --  8.4*  MG 2.5*  --  2.6*  --  2.5*  --   --  2.9*  --   --   --  2.8*  PHOS 3.7   < > 4.1   < > 3.7 4.0  --  4.3 6.2*  --   --  5.1*   < > = values in this interval not displayed.   GFR: Estimated Creatinine Clearance: 38.9 mL/min (A) (by C-G formula based on SCr of 2.02 mg/dL (H)). Recent Labs  Lab 10/18/18 0443 10/20/18 0456 10/21/18 0530 10/22/18 0435  WBC 9.3 12.5* 16.1* 20.3*    Liver Function Tests: Recent Labs  Lab 10/16/18 0445 10/17/18 0350 10/18/18 0443  10/20/18 0456 10/20/18 1548 10/21/18 0530 10/21/18 1550 10/22/18 0435  AST 56* 38 43*  --   --   --   --   --  96*  ALT 69* 59* 47*  --   --   --   --   --  112*  ALKPHOS 93 100 92  --   --    --   --   --  147*  BILITOT 0.3 0.2* 0.5  --   --   --   --   --  0.5  PROT 5.2* 5.7* 5.8*  --   --   --   --   --  7.5  ALBUMIN 1.8* 1.9* 1.7*   < > 1.8* 1.9* 1.8* 1.8* 2.0*  2.0*   < > = values in this interval not displayed.   No results for input(s): LIPASE, AMYLASE in the last 168 hours. No results for input(s): AMMONIA in the last 168 hours.  ABG    Component Value Date/Time   PHART 7.201 (L) 10/22/2018 0349   PCO2ART 62.9 (H) 10/22/2018 0349   PO2ART 76.0 (L) 10/22/2018 0349   HCO3 24.8 10/22/2018 0349   TCO2 27 10/22/2018 0349   ACIDBASEDEF 4.0 (H) 10/22/2018 0349   O2SAT 92.0 10/22/2018 0349     Coagulation Profile: No results for input(s): INR, PROTIME in the last 168 hours.  Cardiac Enzymes: No results for input(s): CKTOTAL, CKMB, CKMBINDEX, TROPONINI in the last 168 hours.  HbA1C: Hgb A1c MFr Bld  Date/Time Value Ref Range Status  10/26/2018 02:30 PM 9.1 (H) 4.8 - 5.6 % Final    Comment:    (NOTE)         Prediabetes: 5.7 - 6.4         Diabetes: >6.4         Glycemic control for adults with diabetes: <7.0     CBG: Recent Labs  Lab 10/21/18 1551 10/21/18 1946 10/22/18 0028 10/22/18 0414 10/22/18 0758  GLUCAP 274* 236* 226* 221* 282*     Critical care time: 40  minutes    Roselie Awkward, MD [facility] Pager: [phone] Cell: [phone] If no response, call [phone]

## 2018-10-22 NOTE — Progress Notes (Signed)
[facility] NEPHROLOGY PROGRESS NOTE  Assessment/ Plan: Pt is a 63 y.o. yo male with a COVID-19 presented with shortness of breath, cough diarrhea.  He had cardiopulmonary arrest on 9/17 with ROSC 9 minutes.  He is intubated and now with worsening renal failure and hyperkalemia.  #Nonoliguric AKI likely ATN after cardiac arrest and COVID-19 infection. Started CRRT on 10/18/2018. I spoke with patient's sister [name] who is in [place].  I used [employer] interpreter # [phone] and discussed at length.  Verbal consent obtained for catheter placement and dialysis treatment.   -Tolerating CRRT well with around 100 cc net UF per hour. Patient is on 0K bath in prefilter and post filter.  We increased  the rate of of both prefilter and post filter to 500 cc. Continue IV heparin for anticoagulation.   I have discussed with the patient's nurse.  Ultrasound kidneys with large kidneys without obstruction. -Left IJ temporary catheter placed by PCCM on 9/21.  #Hyperkalemia: Changed CRRT prescription.  Also due to acidosis.  Monitor lab  #COVID-19 pneumonia/ARDS/vent dependent respiratory failure: Very high FiO2 with poor oxygenation.  Chest tube placed for pneumothorax by PCCM.  #Cardiac arrest on 9/17: 9 minutes ROSC.  #DVT: IV heparin, per pulmonary.  #Elevated LFTs, shock liver.  Subjective: Chart and lab results reviewed.  Discussed with patient's primary nurse today.  Tolerating CRRT well.  No issue with catheter. Objective Vital signs in last 24 hours: Vitals:   10/22/18 0600 10/22/18 0700 10/22/18 0800 10/22/18 0808  BP: 121/72 109/67 101/76 101/76  Pulse: 82 66 61 (!) 59  Resp:    (!) 34  Temp:   (!) 96.8 F (36 C)   TempSrc:   Axillary   SpO2: 96% 98% 100% 100%  Weight:      Height:       Weight change: -1.9 kg  Intake/Output Summary (Last 24 hours) at 10/22/2018 0856 Last data filed at 10/22/2018 0800 Gross per 24 hour  Intake 3271.73 ml  Output 5681 ml  Net -2409.27 ml        Labs: Basic Metabolic Panel: Recent Labs  Lab 10/21/18 0530 10/21/18 1550 10/21/18 1713 10/22/18 0349 10/22/18 0435  NA 136 136 131* 133* 135  K 5.5* 5.5* 4.7 5.2* 5.2*  CL 101 101  --   --  100  CO2 23 21*  --   --  26  GLUCOSE 184* 296*  --   --  236*  BUN 80* 71*  --   --  66*  CREATININE 2.28* 2.08*  --   --  2.02*  CALCIUM 7.8* 8.0*  --   --  8.4*  PHOS 4.3 6.2*  --   --  5.1*   Liver Function Tests: Recent Labs  Lab 10/17/18 0350 10/18/18 0443  10/21/18 0530 10/21/18 1550 10/22/18 0435  AST 38 43*  --   --   --  96*  ALT 59* 47*  --   --   --  112*  ALKPHOS 100 92  --   --   --  147*  BILITOT 0.2* 0.5  --   --   --  0.5  PROT 5.7* 5.8*  --   --   --  7.5  ALBUMIN 1.9* 1.7*   < > 1.8* 1.8* 2.0*  2.0*   < > = values in this interval not displayed.   No results for input(s): LIPASE, AMYLASE in the last 168 hours. No results for input(s): AMMONIA in the last  168 hours. CBC: Recent Labs  Lab 10/17/18 0350 10/18/18 0443  10/20/18 0456  10/21/18 0530 10/21/18 1713 10/22/18 0349 10/22/18 0435  WBC 13.4* 9.3  --  12.5*  --  16.1*  --   --  20.3*  HGB 11.9* 11.1*   < > 11.3*  20.1*   < > 11.5* 12.6* 13.3 12.0*  HCT 38.6* 36.8*   < > 37.4*  59.0*   < > 37.7* 37.0* 39.0 40.1  MCV 99.0 100.0  --  100.5*  --  100.3*  --   --  102.0*  PLT 232 171  --  176  --  177  --   --  180   < > = values in this interval not displayed.   Cardiac Enzymes: No results for input(s): CKTOTAL, CKMB, CKMBINDEX, TROPONINI in the last 168 hours. CBG: Recent Labs  Lab 10/21/18 1551 10/21/18 1946 10/22/18 0028 10/22/18 0414 10/22/18 0758  GLUCAP 274* 236* 226* 221* 282*    Iron Studies: No results for input(s): IRON, TIBC, TRANSFERRIN, FERRITIN in the last 72 hours. Studies/Results: Dg Chest Port 1 View  Result Date: 10/22/2018 CLINICAL DATA:  Pneumothorax EXAM: PORTABLE CHEST 1 VIEW COMPARISON:  10/21/2018 FINDINGS: Endotracheal tube terminates 3.0 cm superior  to the carina. Enteric tube courses below the diaphragm with distal tip beyond the inferior margin of the film. Left IJ approach central venous catheter terminates at the level of the SVC. Pigtail right-sided pleural drainage catheter remains in place. Tiny residual right apical pneumothorax, decreased from prior. Stable cardiomediastinal contours. Patchy bibasilar opacities, right greater than left. IMPRESSION: 1. Trace right apical pneumothorax, improved from prior. 2. Patchy bibasilar opacities, favor atelectasis. Electronically Signed   By: Davina Poke M.D.   On: 10/22/2018 08:22   Dg Chest Port 1 View  Result Date: 10/21/2018 CLINICAL DATA:  Right pneumothorax. Status post chest tube placement. EXAM: PORTABLE CHEST 1 VIEW 10:29 a.m. COMPARISON:  10/21/2018 at 9:41 a.m. FINDINGS: Pigtail catheter has been inserted in the right hemithorax. Almost complete resolution of the right pneumothorax with a tiny residual apical component. New small amount of subcutaneous edema in the right lateral chest wall. Endotracheal tube 4.2 cm above the carina. Central catheter tip is in the superior vena cava at the level of the azygos vein, unchanged. Heart size and vascularity are normal. There is some residual haziness in the right lung with slight atelectasis at the right lung base. The left lung is clear. Feeding tube tip is below the diaphragm. IMPRESSION: 1. Almost complete resolution of the right pneumothorax after placement of pigtail catheter. 2. New small amount of subcutaneous edema in the right lateral chest wall. 3. Slight atelectasis at the right lung base. Electronically Signed   By: Lorriane Shire M.D.   On: 10/21/2018 10:49   Dg Chest Port 1 View  Result Date: 10/21/2018 CLINICAL DATA:  Respiratory failure EXAM: PORTABLE CHEST 1 VIEW COMPARISON:  10/18/2018 FINDINGS: There is a new, large right pneumothorax, approximately 75% volume, with total atelectasis of the right lung. There is depression of  the right costophrenic recess and leftward shift of the mediastinum, features concerning for tension. There is unchanged diffuse interstitial opacity of the left lung. Right neck vascular catheter has been removed. Endotracheal tube, left neck vascular catheter, and partially imaged enteric feeding tube are unchanged. Cardiomegaly. IMPRESSION: There is a new, large right pneumothorax, approximately 75% volume, with total atelectasis of the right lung. There is depression of the right costophrenic recess and  leftward shift of the mediastinum, features concerning for tension. These results were called by telephone at the time of interpretation on 10/21/2018 at 10:07 am to Jenkins Rouge, RN, on behalf of Dr. [name], who verbally acknowledged these results. Electronically Signed   By: Eddie Candle M.D.   On: 10/21/2018 10:20    Medications: Infusions: . sodium chloride 10 mL/hr at 10/22/18 0800  . ceFEPime (MAXIPIME) IV Stopped (10/22/18 0056)  . feeding supplement (VITAL 1.5 CAL) 1,000 mL (10/21/18 1745)  . fentaNYL infusion INTRAVENOUS 250 mcg/hr (10/22/18 0800)  . heparin 1,450 Units/hr (10/22/18 0800)  . midazolam 4 mg/hr (10/22/18 0800)  . norepinephrine (LEVOPHED) Adult infusion Stopped (10/16/18 0111)  . prismasol BGK 0/2.5 500 mL/hr at 10/22/18 0321  . prismasol BGK 0/2.5 500 mL/hr at 10/22/18 0610  . prismasol BGK 4/2.5 1,500 mL/hr at 10/22/18 2248    Scheduled Medications: . artificial tears  1 application Both Eyes G5O  . chlorhexidine gluconate (MEDLINE KIT)  15 mL Mouth Rinse BID  . Chlorhexidine Gluconate Cloth  6 each Topical Daily  . dexamethasone (DECADRON) injection  6 mg Intravenous Q12H  . famotidine  20 mg Per Tube QHS  . feeding supplement (PRO-STAT SUGAR FREE 64)  60 mL Per Tube TID  . insulin aspart  0-20 Units Subcutaneous Q4H  . insulin detemir  30 Units Subcutaneous BID  . mouth rinse  15 mL Mouth Rinse 10 times per day  . multivitamin with minerals  1 tablet  Per Tube Daily  . oxyCODONE  5 mg Per Tube Q6H  . polyethylene glycol  17 g Per Tube BID  . sennosides  5 mL Per Tube BID  . sodium chloride flush  10-40 mL Intracatheter Q12H    have reviewed scheduled and prn medications.  Raeleigh Guinn Prasad Raushanah Osmundson 10/22/2018,8:56 AM  LOS: 11 days  Pager: [phone]

## 2018-10-22 NOTE — Progress Notes (Signed)
ANTICOAGULATION CONSULT NOTE - Follow Up Consult  Pharmacy Consult for Heparin Indication: Suspected PE, Bilateral DVTs, CRRT anticoagulation  No Known Allergies  Patient Measurements: Height: 5\' 3"  (160 cm) Weight: 205 lb 7.5 oz (93.2 kg) IBW/kg (Calculated) : 56.9 Heparin Dosing Weight: 79.5 kg  Vital Signs: Temp: 98.3 F (36.8 C) (09/25 0000) Temp Source: Axillary (09/25 0000) BP: 125/69 (09/25 0200) Pulse Rate: 69 (09/25 0200)  Labs: Recent Labs    10/19/18 0430  10/20/18 0456  10/20/18 1548  10/21/18 0441 10/21/18 0530 10/21/18 0956 10/21/18 1550 10/21/18 1713 10/22/18 0120  HGB  --    < > 11.3*  20.1*  --   --    < > 12.9* 11.5*  --   --  12.6*  --   HCT  --    < > 37.4*  59.0*  --   --    < > 38.0* 37.7*  --   --  37.0*  --   PLT  --   --  176  --   --   --   --  177  --   --   --   --   APTT 61*  --  48*  --   --   --   --  68*  --   --   --   --   HEPARINUNFRC  --    < >  --    < >  --    < >  --   --  0.50 0.58  --  0.39  CREATININE 2.94*   < > 1.96*  --  2.15*  --   --  2.28*  --  2.08*  --   --    < > = values in this interval not displayed.    Estimated Creatinine Clearance: 38.1 mL/min (A) (by C-G formula based on SCr of 2.08 mg/dL (H)).   Medications:  Infusions:  . sodium chloride 10 mL/hr at 10/22/18 0200  . ceFEPime (MAXIPIME) IV Stopped (10/22/18 0056)  . feeding supplement (VITAL 1.5 CAL) 1,000 mL (10/21/18 1745)  . fentaNYL infusion INTRAVENOUS 250 mcg/hr (10/22/18 0200)  . heparin 1,450 Units/hr (10/22/18 0200)  . midazolam 4 mg/hr (10/22/18 0200)  . norepinephrine (LEVOPHED) Adult infusion Stopped (10/16/18 0111)  . prismasol BGK 0/2.5 500 mL/hr at 10/22/18 7681  . prismasol BGK 0/2.5 500 mL/hr at 10/21/18 1948  . prismasol BGK 4/2.5 1,500 mL/hr at 10/22/18 0141    Assessment: Thomas Gill is a 63 y.o. male presented on 9/14 with worsening O2 saturation, cough, diarrhea, COVID-19 positive.  On 9/17, patient decompensated requiring  intubation. On 9/18 patient had acardiorespiratory arrest/code blueevent that lasted approximately 9 minutes until achieving ROSC.  9/18 Vascular US positive for bilateral DVTs.  Pharmacy is consulted to dose heparin for DVT and suspected PE.  Heparin was paused on 9/19 given oral bleeding which has resolved. Per MD, okay to resume IV heparin at 1800 on 9/20.  Confirmatory heparin level remains therapeutic at 0.39.  Goal of Therapy:  Heparin level 0.3-0.5 units/ml Monitor platelets by anticoagulation protocol: Yes   Plan:  Continue heparin gtt at 1,450 units/hr Monitor daily heparin level, CBC, s/s of bleed  Elenor Quinones, PharmD, BCPS, BCIDP Clinical Pharmacist 10/22/2018 3:03 AM

## 2018-10-22 NOTE — Progress Notes (Signed)
Spoke with pts nephrologist Dr Carolin Sicks to clarify orders.  MD would like pt to be net negative 100 mL/hr.

## 2018-10-22 NOTE — Progress Notes (Signed)
Inpatient Diabetes Program Recommendations  AACE/ADA: New Consensus Statement on Inpatient Glycemic Control (2015)  Target Ranges:  Prepandial:   less than 140 mg/dL      Peak postprandial:   less than 180 mg/dL (1-2 hours)      Critically ill patients:  140 - 180 mg/dL   Lab Results  Component Value Date   GLUCAP 282 (H) 10/22/2018   HGBA1C 9.1 (H) 10/10/2018    Review of Glycemic Control Results for KAYVEN, ALDACO (MRN [id number]) as of 10/22/2018 11:42  Ref. Range 10/21/2018 19:46 10/22/2018 00:28 10/22/2018 04:14 10/22/2018 07:58  Glucose-Capillary Latest Ref Range: 70 - 99 mg/dL 236 (H) 226 (H) 221 (H) 282 (H)   Diabetes history:Type 2 DM Current orders for Inpatient glycemic control:Levemir 30 units BID, Novolog 0-20 units Q4H  Decadron 6 mg QD  Inpatient Diabetes Program Recommendations:  Noted increase to Levemir this AM, in agreement.   However, noted that tube feed coverage was discontinued yesterday, but tube feeds are still running. Consider adding back Novolog 4 units Q4H (to be stopped or held in the even tube feeds are stopped).   Thanks, Bronson Curb, MSN, RNC-OB Diabetes Coordinator [phone] (8a-5p)

## 2018-10-22 NOTE — Progress Notes (Addendum)
[facility] TEAM 1 - Stepdown/ICU TEAM  Thomas Gill  BJY:[id number] DOB: 09-06-1955 DOA: 09/28/2018 PCP: Patient, No Pcp Per    Brief Narrative:  62 y.o.malediagnosed with COVID-19 10/07/2018 who presented to the ED with progressive shortness of breath, cough and diarrhea. When EMS arrived at his house he was satting in the 60s on room air.  Patient was admitted to the ICU and intubated within 24 hours of arrival. Hospital course complicated by cardiopulmonary arrest on 9/17 followed by AKI now requiring CRRT.  Significant Events: 9/14 admitted 9/15 intubated 9/17 cardiopulmonary arrest, asystole, ROSC in 9 minutes per chart 9/17 femoral line 9/18 DVT found, started on heparin 9/20 DC femoral line, insert left IJ 9/20 bronchoscopy for mucous plugging 9/21 hemodialysis catheter placement, start CRRT 9/24 spontaneous PTX - s/p R chest tube   COVID-19 specific Treatment: Remdesivir 9/14 > 9/18 Decadron 9/14 > 9/25  Subjective: Sedated and appears comfortable on ventilator in supine position.  Assessment & Plan:  COVID Pneumonia - ARDS - Acute hypoxic respiratory failure Vent management per PCCM -has completed a course of Remdesivir - steroids continue  Acute R tension PTX Noted on rounds 9/24 - s/p chest tube -follow-up chest x-ray confirmed correction of pneumothorax  Cardiac arrest - asystole 9/17 ROSC in 9 mins after epi x3 - ?due to PE  Proteus and Staph aureus tracheobronchitis/pneumonia Narrow antibiotics - follow clinically  Hemoptysis Felt to be due to PE - no evidence of this today  Acute DVT Noted on venous doppler 9/18 - on heparin anticoag  Acute kidney failure  Felt to be ATN due to cardiac arrest - cont CRRT - Nephrology following   Hyperkalemia  Corrected with CRRT  Transaminitis  Likely due to COVID + shock liver - follow  DM2 CBG trending upward -adjust treatment again today and follow  DVT prophylaxis: IV heparin Code Status: FULL CODE Family  Communication:  Disposition Plan: ICU  Consultants:  PCCM  Antimicrobials:  Cefepime 9/21 > Vancomycin 9/21 > 9/22  Objective: Blood pressure 105/71, pulse 64, temperature 98 F (36.7 C), temperature source Axillary, resp. rate (!) 33, height _0  (1.6 m), weight 91.3 kg, SpO2 99 %.  Intake/Output Summary (Last 24 hours) at 10/22/2018 1728 Last data filed at 10/22/2018 1700 Gross per 24 hour  Intake 3531.86 ml  Output 5842 ml  Net -2310.14 ml   Filed Weights   10/20/18 0500 10/21/18 0500 10/22/18 0300  Weight: 95.3 kg 93.2 kg 91.3 kg    Examination: General: Sedated on ventilator in supine position Lungs: Faint diffuse crackles Cardiovascular: RRR without murmur Abdomen: Nondistended, soft, no rebound appreciable, no mass Extremities: Trace bilateral lower extremity edema  CBC: Recent Labs  Lab 10/20/18 0456  10/21/18 0530  10/22/18 0349 10/22/18 0435 10/22/18 1301  WBC 12.5*  --  16.1*  --   --  20.3*  --   HGB 11.3*  20.1*   < > 11.5*   < > 13.3 12.0* 12.2*  HCT 37.4*  59.0*   < > 37.7*   < > 39.0 40.1 36.0*  MCV 100.5*  --  100.3*  --   --  102.0*  --   PLT 176  --  177  --   --  180  --    < > = values in this interval not displayed.   Basic Metabolic Panel: Recent Labs  Lab 10/20/18 0456  10/21/18 0530 10/21/18 1550  10/22/18 0349 10/22/18 0435 10/22/18 1301  NA 137  139   < >  136 136   < > 133* 135 133*  K 5.2*  4.9   < > 5.5* 5.5*   < > 5.2* 5.2* 5.1  CL 103   < > 101 101  --   --  100  --   CO2 25   < > 23 21*  --   --  26  --   GLUCOSE 312*   < > 184* 296*  --   --  236*  --   BUN 75*   < > 80* 71*  --   --  66*  --   CREATININE 1.96*   < > 2.28* 2.08*  --   --  2.02*  --   CALCIUM 8.0*   < > 7.8* 8.0*  --   --  8.4*  --   MG 2.5*  --  2.9*  --   --   --  2.8*  --   PHOS 3.7   < > 4.3 6.2*  --   --  5.1*  --    < > = values in this interval not displayed.   GFR: Estimated Creatinine Clearance: 38.9 mL/min (A) (by C-G formula based on  SCr of 2.02 mg/dL (H)).  Liver Function Tests: Recent Labs  Lab 10/16/18 0445 10/17/18 0350 10/18/18 0443  10/20/18 1548 10/21/18 0530 10/21/18 1550 10/22/18 0435  AST 56* 38 43*  --   --   --   --  96*  ALT 69* 59* 47*  --   --   --   --  112*  ALKPHOS 93 100 92  --   --   --   --  147*  BILITOT 0.3 0.2* 0.5  --   --   --   --  0.5  PROT 5.2* 5.7* 5.8*  --   --   --   --  7.5  ALBUMIN 1.8* 1.9* 1.7*   < > 1.9* 1.8* 1.8* 2.0*  2.0*   < > = values in this interval not displayed.    HbA1C: Hgb A1c MFr Bld  Date/Time Value Ref Range Status  10/20/2018 02:30 PM 9.1 (H) 4.8 - 5.6 % Final    Comment:    (NOTE)         Prediabetes: 5.7 - 6.4         Diabetes: >6.4         Glycemic control for adults with diabetes: <7.0     CBG: Recent Labs  Lab 10/22/18 0028 10/22/18 0414 10/22/18 0758 10/22/18 1213 10/22/18 1554  GLUCAP 226* 221* 282* 324* 261*    Recent Results (from the past 240 hour(s))  MRSA PCR Screening     Status: None   Collection Time: 10/12/18  9:05 PM   Specimen: Nasal Mucosa; Nasopharyngeal  Result Value Ref Range Status   MRSA by PCR NEGATIVE NEGATIVE Final    Comment:        The GeneXpert MRSA Assay (FDA approved for NASAL specimens only), is one component of a comprehensive MRSA colonization surveillance program. It is not intended to diagnose MRSA infection nor to guide or monitor treatment for MRSA infections. Performed at [facility], [place] [address] [place], [place] [address]   Culture, bal-quantitative     Status: Abnormal   Collection Time: 10/17/18 11:37 AM   Specimen: Bronchoalveolar Lavage; Respiratory  Result Value Ref Range Status   Specimen Description   Final    BRONCHIAL ALVEOLAR LAVAGE Performed at [facility]  [place] [address] [place], [place] [address]    Special Requests   Final    NONE Performed at [facility], [place] [address] [place], [place] [address]     Gram Stain   Final    FEW WBC PRESENT, PREDOMINANTLY MONONUCLEAR MODERATE GRAM POSITIVE COCCI FEW GRAM NEGATIVE COCCI RARE GRAM NEGATIVE RODS Performed at [facility], [place] [address], [place], [place] [address]    Culture (A)  Final    >=100,000 COLONIES/mL PROTEUS MIRABILIS >=100,000 COLONIES/mL STAPHYLOCOCCUS AUREUS    Report Status 10/22/2018 FINAL  Final   Organism ID, Bacteria PROTEUS MIRABILIS (A)  Final   Organism ID, Bacteria STAPHYLOCOCCUS AUREUS (A)  Final      Susceptibility   Proteus mirabilis - MIC*    AMPICILLIN <=2 SENSITIVE Sensitive     CEFAZOLIN <=4 SENSITIVE Sensitive     CEFEPIME <=1 SENSITIVE Sensitive     CEFTAZIDIME <=1 SENSITIVE Sensitive     CEFTRIAXONE <=1 SENSITIVE Sensitive     CIPROFLOXACIN <=0.25 SENSITIVE Sensitive     GENTAMICIN <=1 SENSITIVE Sensitive     IMIPENEM 1 SENSITIVE Sensitive     TRIMETH/SULFA <=20 SENSITIVE Sensitive     AMPICILLIN/SULBACTAM <=2 SENSITIVE Sensitive     PIP/TAZO <=4 SENSITIVE Sensitive     * >=100,000 COLONIES/mL PROTEUS MIRABILIS   Staphylococcus aureus - MIC*    CIPROFLOXACIN <=0.5 SENSITIVE Sensitive     ERYTHROMYCIN >=8 RESISTANT Resistant     GENTAMICIN <=0.5 SENSITIVE Sensitive     OXACILLIN <=0.25 SENSITIVE Sensitive     TETRACYCLINE <=1 SENSITIVE Sensitive     VANCOMYCIN 1 SENSITIVE Sensitive     TRIMETH/SULFA <=10 SENSITIVE Sensitive     CLINDAMYCIN RESISTANT Resistant     RIFAMPIN <=0.5 SENSITIVE Sensitive     Inducible Clindamycin POSITIVE Resistant     * >=100,000 COLONIES/mL STAPHYLOCOCCUS AUREUS  Acid Fast Smear (AFB)     Status: None   Collection Time: 10/17/18 11:38 AM   Specimen: Bronchial Alveolar Lavage; Respiratory  Result Value Ref Range Status   AFB Specimen Processing Concentration  Final   Acid Fast Smear Negative  Final    Comment: (NOTE) Performed At: [facility] [place], [place] [id number] Rush Farmer MD KZ:[id number]    Source (AFB) BRONCHIAL  ALVEOLAR LAVAGE  Final    Comment: Performed at [place] [address] [place], [place] [address]  Fungus Culture With Stain     Status: None (Preliminary result)   Collection Time: 10/17/18 11:38 AM  Result Value Ref Range Status   Fungus Stain Final report  Final    Comment: (NOTE) Performed At: [facility] [place], [place] [id number] Rush Farmer MD JS:[id number]    Fungus (Mycology) Culture PENDING  Incomplete   Fungal Source BRONCHIAL ALVEOLAR LAVAGE  Final    Comment: Performed at [facility], [place] [address] [place], [place] [address]  Fungus Culture Result     Status: None   Collection Time: 10/17/18 11:38 AM  Result Value Ref Range Status   Result 1 Comment  Final    Comment: (NOTE) KOH/Calcofluor preparation:  no fungus observed. Performed At: [facility] [place], [place] [id number] Rush Farmer MD OE:[id number]   MRSA PCR Screening     Status: None   Collection Time: 10/18/18 11:48 AM   Specimen: Nasal Mucosa; Nasopharyngeal  Result Value Ref Range Status   MRSA by PCR  NEGATIVE NEGATIVE Final    Comment:        The GeneXpert MRSA Assay (FDA approved for NASAL specimens only), is one component of a comprehensive MRSA colonization surveillance program. It is not intended to diagnose MRSA infection nor to guide or monitor treatment for MRSA infections. Performed at [facility], [place] [address] [place], [place] [address]      Scheduled Meds: . artificial tears  1 application Both Eyes T1Y  . chlorhexidine gluconate (MEDLINE KIT)  15 mL Mouth Rinse BID  . Chlorhexidine Gluconate Cloth  6 each Topical Daily  . dexamethasone (DECADRON) injection  6 mg Intravenous Q12H  . famotidine  20 mg Per Tube QHS  . feeding supplement (PRO-STAT SUGAR FREE 64)  60 mL Per Tube TID  . insulin aspart  0-20 Units Subcutaneous Q4H  . insulin detemir  30 Units  Subcutaneous BID  . mouth rinse  15 mL Mouth Rinse 10 times per day  . multivitamin with minerals  1 tablet Per Tube Daily  . oxyCODONE  5 mg Per Tube Q6H  . polyethylene glycol  17 g Per Tube BID  . sennosides  5 mL Per Tube BID  . sodium chloride flush  10-40 mL Intracatheter Q12H   Continuous Infusions: . sodium chloride 10 mL/hr at 10/22/18 1700  .  ceFAZolin (ANCEF) IV    . feeding supplement (VITAL 1.5 CAL) 1,000 mL (10/22/18 1610)  . fentaNYL infusion INTRAVENOUS 250 mcg/hr (10/22/18 1700)  . heparin 1,450 Units/hr (10/22/18 1700)  . midazolam 4 mg/hr (10/22/18 1700)  . norepinephrine (LEVOPHED) Adult infusion Stopped (10/22/18 1537)  . prismasol BGK 0/2.5 500 mL/hr at 10/22/18 1709  . prismasol BGK 0/2.5 500 mL/hr at 10/22/18 1254  . prismasol BGK 4/2.5 1,500 mL/hr at 10/22/18 1642     LOS: 11 days   Cherene Altes, MD [facility] Office  [phone] Pager - Text Page per Amion  If 7PM-7AM, please contact night-coverage per Amion 10/22/2018, 5:28 PM

## 2018-10-23 ENCOUNTER — Inpatient Hospital Stay (HOSPITAL_COMMUNITY): Payer: Medicaid Other

## 2018-10-23 DIAGNOSIS — N179 Acute kidney failure, unspecified: Secondary | ICD-10-CM | POA: Diagnosis not present

## 2018-10-23 DIAGNOSIS — J80 Acute respiratory distress syndrome: Secondary | ICD-10-CM | POA: Diagnosis not present

## 2018-10-23 DIAGNOSIS — I469 Cardiac arrest, cause unspecified: Secondary | ICD-10-CM | POA: Diagnosis not present

## 2018-10-23 DIAGNOSIS — U071 COVID-19: Secondary | ICD-10-CM | POA: Diagnosis not present

## 2018-10-23 DIAGNOSIS — J9601 Acute respiratory failure with hypoxia: Secondary | ICD-10-CM | POA: Diagnosis not present

## 2018-10-23 LAB — RENAL FUNCTION PANEL
Albumin: 2.1 g/dL — ABNORMAL LOW (ref 3.5–5.0)
Albumin: 2.2 g/dL — ABNORMAL LOW (ref 3.5–5.0)
Anion gap: 14 (ref 5–15)
Anion gap: 15 (ref 5–15)
BUN: 61 mg/dL — ABNORMAL HIGH (ref 6–20)
BUN: 56 mg/dL — ABNORMAL HIGH (ref 6–20)
CO2: 23 mmol/L (ref 22–32)
CO2: 20 mmol/L — ABNORMAL LOW (ref 22–32)
Calcium: 8.1 mg/dL — ABNORMAL LOW (ref 8.9–10.3)
Calcium: 8.4 mg/dL — ABNORMAL LOW (ref 8.9–10.3)
Chloride: 99 mmol/L (ref 98–111)
Chloride: 101 mmol/L (ref 98–111)
Creatinine, Ser: 1.93 mg/dL — ABNORMAL HIGH (ref 0.61–1.24)
Creatinine, Ser: 2.1 mg/dL — ABNORMAL HIGH (ref 0.61–1.24)
GFR calc Af Amer: 43 mL/min — ABNORMAL LOW (ref >60)
GFR calc Af Amer: 38 mL/min — ABNORMAL LOW (ref >60)
GFR calc non Af Amer: 37 mL/min — ABNORMAL LOW (ref >60)
GFR calc non Af Amer: 33 mL/min — ABNORMAL LOW (ref >60)
Glucose, Bld: 271 mg/dL — ABNORMAL HIGH (ref 70–99)
Glucose, Bld: 302 mg/dL — ABNORMAL HIGH (ref 70–99)
Phosphorus: 3.5 mg/dL (ref 2.5–4.6)
Phosphorus: 4.5 mg/dL (ref 2.5–4.6)
Potassium: 4.5 mmol/L (ref 3.5–5.1)
Potassium: 4 mmol/L (ref 3.5–5.1)
Sodium: 136 mmol/L (ref 135–145)
Sodium: 136 mmol/L (ref 135–145)

## 2018-10-23 LAB — CBC
HCT: 39.4 % (ref 39.0–52.0)
Hemoglobin: 12 g/dL — ABNORMAL LOW (ref 13.0–17.0)
MCH: 30.2 pg (ref 26.0–34.0)
MCHC: 30.5 g/dL (ref 30.0–36.0)
MCV: 99.2 fL (ref 80.0–100.0)
Platelets: 177 10*3/uL (ref 150–400)
RBC: 3.97 MIL/uL — ABNORMAL LOW (ref 4.22–5.81)
RDW: 14.7 % (ref 11.5–15.5)
WBC: 18.5 10*3/uL — ABNORMAL HIGH (ref 4.0–10.5)
nRBC: 12.1 % — ABNORMAL HIGH (ref 0.0–0.2)

## 2018-10-23 LAB — GLUCOSE, CAPILLARY
Glucose-Capillary: 245 mg/dL — ABNORMAL HIGH (ref 70–99)
Glucose-Capillary: 238 mg/dL — ABNORMAL HIGH (ref 70–99)
Glucose-Capillary: 352 mg/dL — ABNORMAL HIGH (ref 70–99)
Glucose-Capillary: 291 mg/dL — ABNORMAL HIGH (ref 70–99)
Glucose-Capillary: 296 mg/dL — ABNORMAL HIGH (ref 70–99)

## 2018-10-23 LAB — POCT I-STAT 7, (LYTES, BLD GAS, ICA,H+H)
Acid-base deficit: 2 mmol/L (ref 0.0–2.0)
Bicarbonate: 23.7 mmol/L (ref 20.0–28.0)
Calcium, Ion: 1.1 mmol/L — ABNORMAL LOW (ref 1.15–1.40)
HCT: 40 % (ref 39.0–52.0)
Hemoglobin: 13.6 g/dL (ref 13.0–17.0)
O2 Saturation: 83 %
Patient temperature: 36.9
Potassium: 4.3 mmol/L (ref 3.5–5.1)
Sodium: 133 mmol/L — ABNORMAL LOW (ref 135–145)
TCO2: 25 mmol/L (ref 22–32)
pCO2 arterial: 42.1 mmHg (ref 32.0–48.0)
pH, Arterial: 7.358 (ref 7.350–7.450)
pO2, Arterial: 49 mmHg — ABNORMAL LOW (ref 83.0–108.0)

## 2018-10-23 LAB — HEPATIC FUNCTION PANEL
ALT: 85 U/L — ABNORMAL HIGH (ref 0–44)
AST: 54 U/L — ABNORMAL HIGH (ref 15–41)
Albumin: 2.1 g/dL — ABNORMAL LOW (ref 3.5–5.0)
Alkaline Phosphatase: 148 U/L — ABNORMAL HIGH (ref 38–126)
Bilirubin, Direct: 0.1 mg/dL (ref 0.0–0.2)
Indirect Bilirubin: 0.4 mg/dL (ref 0.3–0.9)
Total Bilirubin: 0.5 mg/dL (ref 0.3–1.2)
Total Protein: 7.8 g/dL (ref 6.5–8.1)

## 2018-10-23 LAB — APTT: aPTT: 62 seconds — ABNORMAL HIGH (ref 24–36)

## 2018-10-23 LAB — MAGNESIUM: Magnesium: 2.8 mg/dL — ABNORMAL HIGH (ref 1.7–2.4)

## 2018-10-23 LAB — HEPARIN LEVEL (UNFRACTIONATED): Heparin Unfractionated: 0.33 IU/mL (ref 0.30–0.70)

## 2018-10-23 MED ORDER — PRISMASOL BGK 4/2.5 32-4-2.5 MEQ/L REPLACEMENT SOLN
Status: DC
  Administered 2018-10-23 – 2018-10-25 (×3): via INTRAVENOUS_CENTRAL

## 2018-10-23 MED ORDER — GUAIFENESIN 100 MG/5ML PO SOLN
15.0000 mL | Freq: Four times a day (QID) | ORAL | Status: DC
  Administered 2018-10-23 – 2018-10-25 (×9): 300 mg via ORAL
  Filled 2018-10-23 (×9): qty 15

## 2018-10-23 MED ORDER — INSULIN ASPART 100 UNIT/ML ~~LOC~~ SOLN
6.0000 [IU] | SUBCUTANEOUS | Status: DC
  Administered 2018-10-23 – 2018-10-24 (×6): 6 [IU] via SUBCUTANEOUS

## 2018-10-23 MED ORDER — PRISMASOL BGK 4/2.5 32-4-2.5 MEQ/L REPLACEMENT SOLN
Status: DC
  Administered 2018-10-23 – 2018-10-24 (×2): via INTRAVENOUS_CENTRAL

## 2018-10-23 MED ORDER — ACETAMINOPHEN 325 MG PO TABS: 650.0000 mg | ORAL_TABLET | ORAL | Status: DC | PRN

## 2018-10-23 NOTE — Progress Notes (Signed)
[facility] NEPHROLOGY PROGRESS NOTE  Assessment/ Plan: Pt is a 63 y.o. yo male with a COVID-19 presented with shortness of breath, cough diarrhea.  He had cardiopulmonary arrest on 9/17 with ROSC 9 minutes.  He is intubated and now with worsening renal failure and hyperkalemia.  Problems:  #Nonoliguric AKI likely ATN after cardiac arrest and COVID-19 infection. Ultrasound kidneys with large kidneys without obstruction.  Started CRRT on 10/18/2018. Verbal consent obtained from family in [place] for catheter placement and dialysis treatment.   -Left IJ temporary catheter placed by PCCM on 9/21 -Tolerating CRRT well with around 100 cc net UF per hour. Wt's are coming down, CVP stable around 9- 12. Getting systemic IV heparin at 1450u/ hr for anticoagulation for DVT.   #Hyperkalemia: resolved. Will change back to all 4K fluids. Monitor lab  #COVID-19 pneumonia/ARDS/vent dependent respiratory failure:  High FiO2 with poor oxygenation. R Chest tube placed for pneumothorax by PCCM.  #Cardiac arrest on 9/17: 9 minutes ROSC.  #DVT: IV heparin, per pulmonary.  #Elevated LFTs, shock liver.  Kelly Splinter, MD 10/23/2018, 5:54 PM     Subjective: Chart and lab results reviewed.   Exam:  Patient not examined directly given COVID-19 + status, utilizing exam of the primary team and observations of RN's.      Objective Vital signs in last 24 hours: Vitals:   10/23/18 1630 10/23/18 1634 10/23/18 1645 10/23/18 1700  BP: (!) 86/65 104/60 (!) 89/64 (!) 91/59  Pulse:  83  84  Resp:  (!) 40    Temp:      TempSrc:      SpO2:  100% 100% 100%  Weight:      Height:       Weight change: -0.8 kg  Intake/Output Summary (Last 24 hours) at 10/23/2018 1749 Last data filed at 10/23/2018 1700 Gross per 24 hour  Intake 3252.23 ml  Output 5749 ml  Net -2496.77 ml       Labs: Basic Metabolic Panel: Recent Labs  Lab 10/22/18 1630 10/23/18 0418 10/23/18 0536 10/23/18 1625  NA  133* 133* 136 136  K 5.2* 4.3 4.5 4.0  CL 102  --  99 101  CO2 18*  --  23 20*  GLUCOSE 296*  --  271* 302*  BUN 65*  --  61* 56*  CREATININE 2.04*  --  1.93* 2.10*  CALCIUM 8.1*  --  8.1* 8.4*  PHOS 3.8  --  3.5 4.5   Liver Function Tests: Recent Labs  Lab 10/18/18 0443  10/22/18 0435  10/23/18 0535 10/23/18 0536 10/23/18 1625  AST 43*  --  96*  --  54*  --   --   ALT 47*  --  112*  --  85*  --   --   ALKPHOS 92  --  147*  --  148*  --   --   BILITOT 0.5  --  0.5  --  0.5  --   --   PROT 5.8*  --  7.5  --  7.8  --   --   ALBUMIN 1.7*   < > 2.0*  2.0*   < > 2.1* 2.1* 2.2*   < > = values in this interval not displayed.   No results for input(s): LIPASE, AMYLASE in the last 168 hours. No results for input(s): AMMONIA in the last 168 hours. CBC: Recent Labs  Lab 10/18/18 0443  10/20/18 0456  10/21/18 0530  10/22/18 0435 10/22/18 1301 10/23/18 3810  10/23/18 0535  WBC 9.3  --  12.5*  --  16.1*  --  20.3*  --   --  18.5*  HGB 11.1*   < > 11.3*  20.1*   < > 11.5*   < > 12.0* 12.2* 13.6 12.0*  HCT 36.8*   < > 37.4*  59.0*   < > 37.7*   < > 40.1 36.0* 40.0 39.4  MCV 100.0  --  100.5*  --  100.3*  --  102.0*  --   --  99.2  PLT 171  --  176  --  177  --  180  --   --  177   < > = values in this interval not displayed.   Cardiac Enzymes: No results for input(s): CKTOTAL, CKMB, CKMBINDEX, TROPONINI in the last 168 hours. CBG: Recent Labs  Lab 10/22/18 2342 10/23/18 0359 10/23/18 0827 10/23/18 1153 10/23/18 1601  GLUCAP 308* 245* 238* 352* 291*    Iron Studies: No results for input(s): IRON, TIBC, TRANSFERRIN, FERRITIN in the last 72 hours. Studies/Results: Dg Chest Port 1 View  Result Date: 10/23/2018 CLINICAL DATA:  Right-sided pneumothorax. EXAM: PORTABLE CHEST 1 VIEW COMPARISON:  10/22/2018 FINDINGS: 0558 hours. Endotracheal tube tip is 2.5 cm above the base of the carina. Right IJ central line tip overlies the innominate vein confluence. The left IJ central  line tip overlies the proximal SVC. A feeding tube passes into the stomach although the distal tip position is not included on the film. Right pleural drain remains in place. Small apically pneumothorax noted on the right. Persistent low lung volumes. The cardio pericardial silhouette is enlarged. Diffuse interstitial and patchy basilar airspace disease is similar to prior. IMPRESSION: 1. Tiny right apical pneumothorax with right pleural drain in situ. 2. Similar diffuse interstitial and patchy basilar airspace disease. 3. Cardiomegaly. Electronically Signed   By: Misty Stanley M.D.   On: 10/23/2018 09:18   Dg Chest Port 1 View  Result Date: 10/22/2018 CLINICAL DATA:  Central line placement EXAM: PORTABLE CHEST 1 VIEW COMPARISON:  10/22/2018, 5:02 a.m. FINDINGS: Interval placement of a right neck vascular catheter, tip projecting over the lower SVC. The remaining support apparatus including left neck vascular catheter, endotracheal tube, and partially imaged enteric tube are unchanged. Otherwise no significant change in AP portable examination with right-sided pigtail chest tube, tiny, less than 5% right apical pneumothorax, displaced right lateral rib fractures, and elevation of the right hemidiaphragm with bibasilar heterogeneous airspace opacity. No new airspace opacity. The heart and mediastinum are unremarkable. IMPRESSION: 1. Interval placement of a right neck vascular catheter, tip projecting over the lower SVC. 2. Otherwise no significant change in AP portable examination. Electronically Signed   By: Eddie Candle M.D.   On: 10/22/2018 13:30   Dg Chest Port 1 View  Result Date: 10/22/2018 CLINICAL DATA:  Pneumothorax EXAM: PORTABLE CHEST 1 VIEW COMPARISON:  10/21/2018 FINDINGS: Endotracheal tube terminates 3.0 cm superior to the carina. Enteric tube courses below the diaphragm with distal tip beyond the inferior margin of the film. Left IJ approach central venous catheter terminates at the level of  the SVC. Pigtail right-sided pleural drainage catheter remains in place. Tiny residual right apical pneumothorax, decreased from prior. Stable cardiomediastinal contours. Patchy bibasilar opacities, right greater than left. IMPRESSION: 1. Trace right apical pneumothorax, improved from prior. 2. Patchy bibasilar opacities, favor atelectasis. Electronically Signed   By: Davina Poke M.D.   On: 10/22/2018 08:22    Medications: Infusions: . sodium  chloride 10 mL/hr at 10/23/18 1700  .  ceFAZolin (ANCEF) IV Stopped (10/23/18 1103)  . feeding supplement (VITAL 1.5 CAL) 1,000 mL (10/23/18 1600)  . fentaNYL infusion INTRAVENOUS 250 mcg/hr (10/23/18 1743)  . heparin 1,450 Units/hr (10/23/18 1700)  . midazolam 4 mg/hr (10/23/18 1700)  . norepinephrine (LEVOPHED) Adult infusion 5 mcg/min (10/23/18 1700)  . prismasol BGK 0/2.5 500 mL/hr at 10/23/18 1333  . prismasol BGK 0/2.5 500 mL/hr at 10/23/18 1014  . prismasol BGK 4/2.5 1,500 mL/hr at 10/23/18 1328    Scheduled Medications: . acetylcysteine  4 mL Nebulization Q6H  . albuterol  2.5 mg Nebulization Q6H  . artificial tears  1 application Both Eyes I1V  . chlorhexidine gluconate (MEDLINE KIT)  15 mL Mouth Rinse BID  . Chlorhexidine Gluconate Cloth  6 each Topical Daily  . famotidine  20 mg Per Tube QHS  . feeding supplement (PRO-STAT SUGAR FREE 64)  60 mL Per Tube TID  . guaiFENesin  15 mL Oral Q6H  . insulin aspart  0-20 Units Subcutaneous Q4H  . insulin aspart  6 Units Subcutaneous Q4H  . insulin detemir  38 Units Subcutaneous BID  . mouth rinse  15 mL Mouth Rinse 10 times per day  . multivitamin with minerals  1 tablet Per Tube Daily  . oxyCODONE  5 mg Per Tube Q6H  . polyethylene glycol  17 g Per Tube BID  . sennosides  5 mL Per Tube BID  . sodium chloride flush  10-40 mL Intracatheter Q12H    have reviewed scheduled and prn medications.  Sandy Salaam Marilyn Nihiser 10/23/2018,5:49 PM  LOS: 12 days

## 2018-10-23 NOTE — Progress Notes (Signed)
Pts family lives in [place].  Calls for updated with translator when convenient for them.

## 2018-10-23 NOTE — Progress Notes (Signed)
[facility] TEAM 1 - Stepdown/ICU TEAM  Joahan Swatzell  PZW:[id number] DOB: 01-11-1956 DOA: 10/23/2018 PCP: Patient, No Pcp Per    Brief Narrative:  63 y.o.malediagnosed with COVID-19 10/04/2018 who presented to the ED with progressive shortness of breath, cough and diarrhea. When EMS arrived at his house he was satting in the 60s on room air.  Patient was admitted to the ICU and intubated within 24 hours of arrival. Hospital course complicated by cardiopulmonary arrest on 9/17 followed by AKI now requiring CRRT.  Significant Events: 9/14 admitted 9/15 intubated 9/17 cardiopulmonary arrest, asystole, ROSC in 9 minutes per chart 9/17 femoral line 9/18 DVT found, started on heparin 9/20 DC femoral line, insert left IJ 9/20 bronchoscopy for mucous plugging 9/21 hemodialysis catheter placement, start CRRT 9/24 spontaneous PTX - s/p R chest tube   COVID-19 specific Treatment: Remdesivir 9/14 > 9/18 Decadron 9/14 > 9/25  Subjective: Sedated on vent. No apparent discomfort. CRRT continues.   Assessment & Plan:  COVID Pneumonia - ARDS - Acute hypoxic respiratory failure Vent management per PCCM -has completed a course of Remdesivir - steroids continue  Acute R tension PTX Noted on rounds 9/24 - s/p chest tube -follow-up chest x-ray confirmed correction of pneumothorax  Cardiac arrest - asystole 9/17 ROSC in 9 mins after epi x3 - ?due to PE  Proteus and Staph aureus tracheobronchitis/pneumonia Focused antibiotic tx continues - follow clinically  Hemoptysis Felt to be due to PE - appears to have resolved   Acute DVT Noted on venous doppler 9/18 - on heparin anticoag  Acute kidney failure  Felt to be ATN due to cardiac arrest - cont CRRT - Nephrology following   Hyperkalemia  Corrected with CRRT  Transaminitis  Likely due to COVID + shock liver - follow  DM2 CBG poorly controlled - tx adjusted - follow response  DVT prophylaxis: IV heparin Code Status: FULL CODE Family  Communication:  Disposition Plan: ICU  Consultants:  PCCM  Antimicrobials:  Cefepime 9/21 > 9/25 Cefazolin 9/25 > Vancomycin 9/21 > 9/22  Objective: Blood pressure 93/62, pulse 74, temperature 98.7 F (37.1 C), temperature source Oral, resp. rate (!) 36, height _0  (1.6 m), weight 90.5 kg, SpO2 96 %.  Intake/Output Summary (Last 24 hours) at 10/23/2018 0851 Last data filed at 10/23/2018 0800 Gross per 24 hour  Intake 3309.22 ml  Output 5685 ml  Net -2375.78 ml   Filed Weights   10/21/18 0500 10/22/18 0300 10/23/18 0500  Weight: 93.2 kg 91.3 kg 90.5 kg    Examination: General: Sedated on ventilator  Lungs: Faint diffuse crackles - no wheezing  Cardiovascular: RRR  Abdomen: Nondistended, soft, no rebound appreciable, no mass Extremities: Trace B LE edema   CBC: Recent Labs  Lab 10/21/18 0530  10/22/18 0435 10/22/18 1301 10/23/18 0418 10/23/18 0535  WBC 16.1*  --  20.3*  --   --  18.5*  HGB 11.5*   < > 12.0* 12.2* 13.6 12.0*  HCT 37.7*   < > 40.1 36.0* 40.0 39.4  MCV 100.3*  --  102.0*  --   --  99.2  PLT 177  --  180  --   --  177   < > = values in this interval not displayed.   Basic Metabolic Panel: Recent Labs  Lab 10/21/18 0530  10/22/18 0435  10/22/18 1630 10/23/18 0418 10/23/18 0535 10/23/18 0536  NA 136   < > 135   < > 133* 133*  --  136  K  5.5*   < > 5.2*   < > 5.2* 4.3  --  4.5  CL 101   < > 100  --  102  --   --  99  CO2 23   < > 26  --  18*  --   --  23  GLUCOSE 184*   < > 236*  --  296*  --   --  271*  BUN 80*   < > 66*  --  65*  --   --  61*  CREATININE 2.28*   < > 2.02*  --  2.04*  --   --  1.93*  CALCIUM 7.8*   < > 8.4*  --  8.1*  --   --  8.1*  MG 2.9*  --  2.8*  --   --   --  2.8*  --   PHOS 4.3   < > 5.1*  --  3.8  --   --  3.5   < > = values in this interval not displayed.   GFR: Estimated Creatinine Clearance: 40.5 mL/min (A) (by C-G formula based on SCr of 1.93 mg/dL (H)).  Liver Function Tests: Recent Labs  Lab 10/17/18  0350 10/18/18 0443  10/22/18 0435 10/22/18 1630 10/23/18 0535 10/23/18 0536  AST 38 43*  --  96*  --  54*  --   ALT 59* 47*  --  112*  --  85*  --   ALKPHOS 100 92  --  147*  --  148*  --   BILITOT 0.2* 0.5  --  0.5  --  0.5  --   PROT 5.7* 5.8*  --  7.5  --  7.8  --   ALBUMIN 1.9* 1.7*   < > 2.0*  2.0* 1.9* 2.1* 2.1*   < > = values in this interval not displayed.    HbA1C: Hgb A1c MFr Bld  Date/Time Value Ref Range Status  10/18/2018 02:30 PM 9.1 (H) 4.8 - 5.6 % Final    Comment:    (NOTE)         Prediabetes: 5.7 - 6.4         Diabetes: >6.4         Glycemic control for adults with diabetes: <7.0     CBG: Recent Labs  Lab 10/22/18 1554 10/22/18 1945 10/22/18 2342 10/23/18 0359 10/23/18 0827  GLUCAP 261* 299* 308* 245* 238*    Recent Results (from the past 240 hour(s))  Culture, bal-quantitative     Status: Abnormal   Collection Time: 10/17/18 11:37 AM   Specimen: Bronchoalveolar Lavage; Respiratory  Result Value Ref Range Status   Specimen Description   Final    BRONCHIAL ALVEOLAR LAVAGE Performed at [facility] [address] [place], [place] [address]    Special Requests   Final    NONE Performed at [facility], [place] [address], [place], [place] [address]    Gram Stain   Final    FEW WBC PRESENT, PREDOMINANTLY MONONUCLEAR MODERATE GRAM POSITIVE COCCI FEW GRAM NEGATIVE COCCI RARE GRAM NEGATIVE RODS Performed at [facility], [place] [address], [place],  [address]    Culture (A)  Final    >=100,000 COLONIES/mL PROTEUS MIRABILIS >=100,000 COLONIES/mL STAPHYLOCOCCUS AUREUS    Report Status 10/22/2018 FINAL  Final   Organism ID, Bacteria PROTEUS MIRABILIS (A)  Final   Organism ID, Bacteria STAPHYLOCOCCUS AUREUS (A)  Final      Susceptibility  Proteus mirabilis - MIC*    AMPICILLIN <=2 SENSITIVE Sensitive     CEFAZOLIN <=4 SENSITIVE Sensitive     CEFEPIME <=1 SENSITIVE Sensitive     CEFTAZIDIME  <=1 SENSITIVE Sensitive     CEFTRIAXONE <=1 SENSITIVE Sensitive     CIPROFLOXACIN <=0.25 SENSITIVE Sensitive     GENTAMICIN <=1 SENSITIVE Sensitive     IMIPENEM 1 SENSITIVE Sensitive     TRIMETH/SULFA <=20 SENSITIVE Sensitive     AMPICILLIN/SULBACTAM <=2 SENSITIVE Sensitive     PIP/TAZO <=4 SENSITIVE Sensitive     * >=100,000 COLONIES/mL PROTEUS MIRABILIS   Staphylococcus aureus - MIC*    CIPROFLOXACIN <=0.5 SENSITIVE Sensitive     ERYTHROMYCIN >=8 RESISTANT Resistant     GENTAMICIN <=0.5 SENSITIVE Sensitive     OXACILLIN <=0.25 SENSITIVE Sensitive     TETRACYCLINE <=1 SENSITIVE Sensitive     VANCOMYCIN 1 SENSITIVE Sensitive     TRIMETH/SULFA <=10 SENSITIVE Sensitive     CLINDAMYCIN RESISTANT Resistant     RIFAMPIN <=0.5 SENSITIVE Sensitive     Inducible Clindamycin POSITIVE Resistant     * >=100,000 COLONIES/mL STAPHYLOCOCCUS AUREUS  Acid Fast Smear (AFB)     Status: None   Collection Time: 10/17/18 11:38 AM   Specimen: Bronchial Alveolar Lavage; Respiratory  Result Value Ref Range Status   AFB Specimen Processing Concentration  Final   Acid Fast Smear Negative  Final    Comment: (NOTE) Performed At: [facility] [place], [place] [id number] Rush Farmer MD YY:[id number]    Source (AFB) BRONCHIAL ALVEOLAR LAVAGE  Final    Comment: Performed at [facility] [address], [place], [place] [address]  Fungus Culture With Stain     Status: None (Preliminary result)   Collection Time: 10/17/18 11:38 AM  Result Value Ref Range Status   Fungus Stain Final report  Final    Comment: (NOTE) Performed At: [facility] [place], [place] [id number] Rush Farmer MD QP:[id number]    Fungus (Mycology) Culture PENDING  Incomplete   Fungal Source BRONCHIAL ALVEOLAR LAVAGE  Final    Comment: Performed at [facility], [place] [address], [place], [place] [address]  Fungus Culture Result     Status: None    Collection Time: 10/17/18 11:38 AM  Result Value Ref Range Status   Result 1 Comment  Final    Comment: (NOTE) KOH/Calcofluor preparation:  no fungus observed. Performed At: [facility] [place], [place] [id number] Rush Farmer MD ES:[id number]   MRSA PCR Screening     Status: None   Collection Time: 10/18/18 11:48 AM   Specimen: Nasal Mucosa; Nasopharyngeal  Result Value Ref Range Status   MRSA by PCR NEGATIVE NEGATIVE Final    Comment:        The GeneXpert MRSA Assay (FDA approved for NASAL specimens only), is one component of a comprehensive MRSA colonization surveillance program. It is not intended to diagnose MRSA infection nor to guide or monitor treatment for MRSA infections. Performed at [facility], [place] [address] [place], [place] [address]      Scheduled Meds: . acetylcysteine  4 mL Nebulization Q6H  . albuterol  2.5 mg Nebulization Q6H  . artificial tears  1 application Both Eyes K5G  . chlorhexidine gluconate (MEDLINE KIT)  15 mL Mouth Rinse BID  . Chlorhexidine Gluconate Cloth  6 each Topical Daily  . famotidine  20 mg Per Tube QHS  . feeding supplement (  PRO-STAT SUGAR FREE 64)  60 mL Per Tube TID  . insulin aspart  0-20 Units Subcutaneous Q4H  . insulin detemir  38 Units Subcutaneous BID  . mouth rinse  15 mL Mouth Rinse 10 times per day  . multivitamin with minerals  1 tablet Per Tube Daily  . oxyCODONE  5 mg Per Tube Q6H  . polyethylene glycol  17 g Per Tube BID  . sennosides  5 mL Per Tube BID  . sodium chloride flush  10-40 mL Intracatheter Q12H   Continuous Infusions: . sodium chloride 10 mL/hr at 10/23/18 0800  .  ceFAZolin (ANCEF) IV Stopped (10/22/18 2203)  . feeding supplement (VITAL 1.5 CAL) 1,000 mL (10/22/18 1610)  . fentaNYL infusion INTRAVENOUS 250 mcg/hr (10/23/18 0800)  . heparin 1,450 Units/hr (10/23/18 0800)  . midazolam 4 mg/hr (10/23/18 0800)  . norepinephrine (LEVOPHED) Adult infusion  2 mcg/min (10/23/18 0800)  . prismasol BGK 0/2.5 500 mL/hr at 10/23/18 0314  . prismasol BGK 0/2.5 500 mL/hr at 10/22/18 2349  . prismasol BGK 4/2.5 1,500 mL/hr at 10/23/18 9518     LOS: 12 days   Cherene Altes, MD [facility] Office  [phone] Pager - Text Page per Amion  If 7PM-7AM, please contact night-coverage per Amion 10/23/2018, 8:51 AM

## 2018-10-23 NOTE — Progress Notes (Signed)
Spoke with pts daughter Nocheli via interpreter.  Updated on pt status.  Answered all questions.  Daughter to call back to set up video chat after 8pm.

## 2018-10-23 NOTE — Progress Notes (Signed)
ANTICOAGULATION CONSULT NOTE - Follow Up Consult  Pharmacy Consult for Heparin Indication: Suspected PE, Bilateral DVTs, CRRT anticoagulation  No Known Allergies  Patient Measurements: Height: 5\' 3"  (160 cm) Weight: 199 lb 8.3 oz (90.5 kg) IBW/kg (Calculated) : 56.9 Heparin Dosing Weight: 79.5 kg  Vital Signs: Temp: 98.7 F (37.1 C) (09/26 0737) Temp Source: Oral (09/26 0737) BP: 106/72 (09/26 0815) Pulse Rate: 75 (09/26 0815)  Labs: Recent Labs    10/21/18 0530  10/21/18 1550  10/22/18 0120  10/22/18 0435 10/22/18 1301 10/22/18 1630 10/23/18 0418 10/23/18 0535 10/23/18 0536  HGB 11.5*  --   --    < >  --    < > 12.0* 12.2*  --  13.6 12.0*  --   HCT 37.7*  --   --    < >  --    < > 40.1 36.0*  --  40.0 39.4  --   PLT 177  --   --   --   --   --  180  --   --   --  177  --   APTT 68*  --   --   --   --   --  66*  --   --   --  62*  --   HEPARINUNFRC  --    < > 0.58  --  0.39  --   --   --   --   --  0.33  --   CREATININE 2.28*  --  2.08*  --   --   --  2.02*  --  2.04*  --   --  1.93*   < > = values in this interval not displayed.    Estimated Creatinine Clearance: 40.5 mL/min (A) (by C-G formula based on SCr of 1.93 mg/dL (H)).  Medications: Infusions:  . sodium chloride 10 mL/hr at 10/23/18 0900  .  ceFAZolin (ANCEF) IV Stopped (10/22/18 2203)  . feeding supplement (VITAL 1.5 CAL) 1,000 mL (10/22/18 1610)  . fentaNYL infusion INTRAVENOUS 250 mcg/hr (10/23/18 0900)  . heparin 1,450 Units/hr (10/23/18 0900)  . midazolam 4 mg/hr (10/23/18 0900)  . norepinephrine (LEVOPHED) Adult infusion 2 mcg/min (10/23/18 0900)  . prismasol BGK 0/2.5 500 mL/hr at 10/23/18 0314  . prismasol BGK 0/2.5 500 mL/hr at 10/22/18 2349  . prismasol BGK 4/2.5 1,500 mL/hr at 10/23/18 7893    Assessment: Thomas Gill is a 63 y.o. male presented on 9/14 with worsening O2 saturation, cough, diarrhea, COVID-19 positive.  On 9/17, patient decompensated requiring intubation. On 9/18 patient had  acardiorespiratory arrest/code blueevent that lasted approximately 9 minutes until achieving ROSC.  9/18 Vascular US positive for bilateral DVTs.  Pharmacy is consulted to dose heparin for DVT and suspected PE.  Heparin was paused on 9/19 given oral bleeding which has resolved. Per MD, okay to resume IV heparin at 1800 on 9/20.  Chest tube placed on 9/24 for spontaneous PTX.  Heparin level remains therapeutic at 0.33, CBC is low/stable with Hgb 12, Plt 177.  Continues on CRRT without complications.  No bleeding or line problems reported.  Goal of Therapy:  Heparin level 0.3-0.5 units/ml Monitor platelets by anticoagulation protocol: Yes   Plan:  Continue heparin gtt at 1,450 units/hr Monitor daily heparin level, CBC, s/s of bleed  Gretta Arab PharmD, BCPS Clinical pharmacist phone 7am- 5pm: [phone] 10/23/2018 9:32 AM

## 2018-10-23 NOTE — Progress Notes (Signed)
NAMEBarney Gill, MRN:  [id number], DOB:  April 02, 1955, LOS: 12 ADMISSION DATE:  10/13/2018, CONSULTATION DATE:  9/15 REFERRING MD:  Ashok Cordia, CHIEF COMPLAINT:  Dyspnea   Brief History   63 y/o male with DM2 admitted with COVID Pneumonia causing severe acute respiratory failure with hypoxemia.  Intubated on 9/16  Past Medical History  Former smoker DM2 Obesity  [place] Hospital Events   9/14 admission 9/17 intubated, proned.  Cardiac arrest later in day while he was in prone position.  Central line placed.  Started on Levophed drip 9/18- Paralyzed for vent dysynchrony 9/19- Started heparin for DVT 9/20-bronchoscopy for mucous plugging, left hemithorax opacification 9/21-starting CVVH, restart proning 9/24 R pneumothorax  Consults:  PCCM Nephrology  Procedures:  ETT 9/17>>> Femoral CVL 9/17 >> 9/20 Lt IJ CVL 9/20 >> Rt fem a line 9/21 >> Rt IJ HD cath 9/25 >> Right chest tube 9/24 >>  Significant Diagnostic Tests:  Lower extremity duplex 9/19> age indeterminate bilateral DVT  Micro Data:  9/14 SARS COV 2 positive BAL 9/20 >> proteus, MSSA BAL 9/20 AFB, Fungus > neg  Antimicrobials/COVID Rx  9/14 Decadron >  9/14 Remdesivir >  9/18  Vancomycin 9/21> 9/22 Cefepime 9/21>9/25 Ancef 9/25 >>  Interim history/subjective:  Remains critically ill, on vent 90%/PEEP of 14 On CRRT On low-dose Levophed Afebrile   Objective   Blood pressure 99/73, pulse 78, temperature 99.1 F (37.3 C), temperature source Oral, resp. rate (!) 35, height _0  (1.6 m), weight 90.5 kg, SpO2 100 %. CVP:  [8 mmHg-20 mmHg] 16 mmHg  Vent Mode: PRVC FiO2 (%):  [60 %-100 %] 90 % Set Rate:  [35 bmp] 35 bmp Vt Set:  [450 mL] 450 mL PEEP:  [14 cmH20] 14 cmH20 Plateau Pressure:  [26 cmH20-33 cmH20] 27 cmH20   Intake/Output Summary (Last 24 hours) at 10/23/2018 1550 Last data filed at 10/23/2018 1500 Gross per 24 hour  Intake 3275.74 ml  Output 5783 ml  Net -2507.26 ml   Filed Weights    10/21/18 0500 10/22/18 0300 10/23/18 0500  Weight: 93.2 kg 91.3 kg 90.5 kg    Examination:  General: Acutely ill, appears older than stated age, no distress, on vent HENT: NCAT ETT in place PULM: CTA B, no rhonchi, no accessory muscle use, right chest tube has 1+ airleak CV: RRR, no mgr GI: BS+, soft, nontender MSK: normal bulk and tone Neuro: sedated on Versed and fentanyl, RA SS -3   Chest x-ray 9/26 personally reviewed which shows right pigtail chest tube in position, no change in bilateral airspace disease, small apical right pneumothorax  Labs show severe hypoxia , slight decrease leukocytosis, normal electrolytes  Resolved Hospital Problem list     Assessment & Plan:  ARDS due to COVID 19 pneumonia 9/24 new right pneumothorax Continue mechanical ventilation per ARDS protocol Target TVol 6-8cc/kgIBW Target Plateau Pressure < 30cm H20 Target driving pressure less than 15 cm of water Target PaO2 55-65: titrate PEEP/FiO2 per protocol As long as PaO2 to FiO2 ratio is less than 1:150 position in prone position for 16 hours a day Ventilator associated pneumonia prevention protocol   AKI, oliguric CVVHD per renal Keep -100 cc as long as hemodynamically tolerated, okay to use low-dose Levophed for blood pressure support  HCAP: proteus, MSSA Biotics changed to Ancef complete 7 days  Cardiac arrest, PE? Tele  Lower extremity DVT Heparin infusion  Need for sedation for mechanical ventilation RASS target -4 to -5 with Versed and fentanyl drip  Intermittent vecuronium protocol  Shock liver -resolving  Hyperglycemia: Levemir/SSI per [facility]  Best practice:  Diet: tube feeding Pain/Anxiety/Delirium protocol (if indicated): as above VAP protocol (if indicated): yes DVT prophylaxis: heparin infusion GI prophylaxis: famotidine Glucose control: SSI Mobility: bed rest Code Status: full Family Communication:  nephew in [place] Thomas Gill [phone]) 9/25 ; He  told me that the patient has a daughter, Thomas Gill [phone]) Disposition:  ICU   The patient is critically ill with multiple organ systems failure and requires high complexity decision making for assessment and support, frequent evaluation and titration of therapies, application of advanced monitoring technologies and extensive interpretation of multiple databases. Critical Care Time devoted to patient care services described in this note independent of APP/resident  time is 35 minutes.    Kara [place] MD. Shade Flood. [facility] Pager [phone] If no response call [phone]   10/23/2018

## 2018-10-24 ENCOUNTER — Inpatient Hospital Stay (HOSPITAL_COMMUNITY): Payer: Medicaid Other

## 2018-10-24 DIAGNOSIS — A419 Sepsis, unspecified organism: Secondary | ICD-10-CM | POA: Diagnosis not present

## 2018-10-24 DIAGNOSIS — I469 Cardiac arrest, cause unspecified: Secondary | ICD-10-CM

## 2018-10-24 DIAGNOSIS — R6521 Severe sepsis with septic shock: Secondary | ICD-10-CM | POA: Diagnosis not present

## 2018-10-24 DIAGNOSIS — J9312 Secondary spontaneous pneumothorax: Secondary | ICD-10-CM

## 2018-10-24 DIAGNOSIS — N179 Acute kidney failure, unspecified: Secondary | ICD-10-CM | POA: Diagnosis not present

## 2018-10-24 DIAGNOSIS — J80 Acute respiratory distress syndrome: Secondary | ICD-10-CM | POA: Diagnosis not present

## 2018-10-24 DIAGNOSIS — J9601 Acute respiratory failure with hypoxia: Secondary | ICD-10-CM | POA: Diagnosis not present

## 2018-10-24 DIAGNOSIS — U071 COVID-19: Secondary | ICD-10-CM | POA: Diagnosis not present

## 2018-10-24 LAB — CBC
HCT: 44.7 % (ref 39.0–52.0)
Hemoglobin: 13.2 g/dL (ref 13.0–17.0)
MCH: 30.3 pg (ref 26.0–34.0)
MCHC: 29.5 g/dL — ABNORMAL LOW (ref 30.0–36.0)
MCV: 102.5 fL — ABNORMAL HIGH (ref 80.0–100.0)
Platelets: 183 10*3/uL (ref 150–400)
RBC: 4.36 MIL/uL (ref 4.22–5.81)
RDW: 15 % (ref 11.5–15.5)
WBC: 21.5 10*3/uL — ABNORMAL HIGH (ref 4.0–10.5)
nRBC: 14.4 % — ABNORMAL HIGH (ref 0.0–0.2)

## 2018-10-24 LAB — POCT I-STAT 7, (LYTES, BLD GAS, ICA,H+H)
Acid-base deficit: 11 mmol/L — ABNORMAL HIGH (ref 0.0–2.0)
Acid-base deficit: 2 mmol/L (ref 0.0–2.0)
Bicarbonate: 21.7 mmol/L (ref 20.0–28.0)
Bicarbonate: 25.2 mmol/L (ref 20.0–28.0)
Calcium, Ion: 1.06 mmol/L — ABNORMAL LOW (ref 1.15–1.40)
Calcium, Ion: 1.14 mmol/L — ABNORMAL LOW (ref 1.15–1.40)
HCT: 41 % (ref 39.0–52.0)
HCT: 43 % (ref 39.0–52.0)
Hemoglobin: 13.9 g/dL (ref 13.0–17.0)
Hemoglobin: 14.6 g/dL (ref 13.0–17.0)
O2 Saturation: 93 %
O2 Saturation: 94 %
Patient temperature: 94.4
Patient temperature: 98.6
Potassium: 4.6 mmol/L (ref 3.5–5.1)
Potassium: 5.5 mmol/L — ABNORMAL HIGH (ref 3.5–5.1)
Sodium: 133 mmol/L — ABNORMAL LOW (ref 135–145)
Sodium: 136 mmol/L (ref 135–145)
TCO2: 24 mmol/L (ref 22–32)
TCO2: 27 mmol/L (ref 22–32)
pCO2 arterial: 54 mmHg — ABNORMAL HIGH (ref 32.0–48.0)
pCO2 arterial: 73.8 mmHg (ref 32.0–48.0)
pH, Arterial: 7.06 — CL (ref 7.350–7.450)
pH, Arterial: 7.278 — ABNORMAL LOW (ref 7.350–7.450)
pO2, Arterial: 84 mmHg (ref 83.0–108.0)
pO2, Arterial: 88 mmHg (ref 83.0–108.0)

## 2018-10-24 LAB — RENAL FUNCTION PANEL
Albumin: 2.1 g/dL — ABNORMAL LOW (ref 3.5–5.0)
Albumin: 2.3 g/dL — ABNORMAL LOW (ref 3.5–5.0)
Anion gap: 15 (ref 5–15)
Anion gap: 17 — ABNORMAL HIGH (ref 5–15)
BUN: 56 mg/dL — ABNORMAL HIGH (ref 6–20)
BUN: 49 mg/dL — ABNORMAL HIGH (ref 6–20)
CO2: 22 mmol/L (ref 22–32)
CO2: 19 mmol/L — ABNORMAL LOW (ref 22–32)
Calcium: 8.6 mg/dL — ABNORMAL LOW (ref 8.9–10.3)
Calcium: 7.6 mg/dL — ABNORMAL LOW (ref 8.9–10.3)
Chloride: 100 mmol/L (ref 98–111)
Chloride: 103 mmol/L (ref 98–111)
Creatinine, Ser: 1.99 mg/dL — ABNORMAL HIGH (ref 0.61–1.24)
Creatinine, Ser: 2.06 mg/dL — ABNORMAL HIGH (ref 0.61–1.24)
GFR calc Af Amer: 41 mL/min — ABNORMAL LOW (ref >60)
GFR calc Af Amer: 39 mL/min — ABNORMAL LOW (ref >60)
GFR calc non Af Amer: 35 mL/min — ABNORMAL LOW (ref >60)
GFR calc non Af Amer: 34 mL/min — ABNORMAL LOW (ref >60)
Glucose, Bld: 243 mg/dL — ABNORMAL HIGH (ref 70–99)
Glucose, Bld: 181 mg/dL — ABNORMAL HIGH (ref 70–99)
Phosphorus: 5.8 mg/dL — ABNORMAL HIGH (ref 2.5–4.6)
Phosphorus: 7 mg/dL — ABNORMAL HIGH (ref 2.5–4.6)
Potassium: 5.2 mmol/L — ABNORMAL HIGH (ref 3.5–5.1)
Potassium: 5.1 mmol/L (ref 3.5–5.1)
Sodium: 137 mmol/L (ref 135–145)
Sodium: 139 mmol/L (ref 135–145)

## 2018-10-24 LAB — APTT: aPTT: 58 seconds — ABNORMAL HIGH (ref 24–36)

## 2018-10-24 LAB — HEPARIN LEVEL (UNFRACTIONATED)
Heparin Unfractionated: 0.25 IU/mL — ABNORMAL LOW (ref 0.30–0.70)
Heparin Unfractionated: 0.28 IU/mL — ABNORMAL LOW (ref 0.30–0.70)

## 2018-10-24 LAB — GLUCOSE, CAPILLARY
Glucose-Capillary: 160 mg/dL — ABNORMAL HIGH (ref 70–99)
Glucose-Capillary: 197 mg/dL — ABNORMAL HIGH (ref 70–99)
Glucose-Capillary: 246 mg/dL — ABNORMAL HIGH (ref 70–99)
Glucose-Capillary: 258 mg/dL — ABNORMAL HIGH (ref 70–99)
Glucose-Capillary: 276 mg/dL — ABNORMAL HIGH (ref 70–99)
Glucose-Capillary: 276 mg/dL — ABNORMAL HIGH (ref 70–99)
Glucose-Capillary: 336 mg/dL — ABNORMAL HIGH (ref 70–99)

## 2018-10-24 LAB — MAGNESIUM: Magnesium: 3.2 mg/dL — ABNORMAL HIGH (ref 1.7–2.4)

## 2018-10-24 MED ORDER — LIDOCAINE-EPINEPHRINE 1 %-1:100000 IJ SOLN
20.0000 mL | Freq: Once | INTRAMUSCULAR | Status: DC
  Filled 2018-10-24: qty 20

## 2018-10-24 MED ORDER — PRISMASOL BGK 0/2.5 32-2.5 MEQ/L IV SOLN
INTRAVENOUS | Status: DC
  Administered 2018-10-24 – 2018-10-25 (×4): via INTRAVENOUS_CENTRAL
  Filled 2018-10-24 (×11): qty 5000

## 2018-10-24 MED ORDER — VASOPRESSIN 20 UNIT/ML IV SOLN
0.0300 [IU]/min | INTRAVENOUS | Status: DC
  Administered 2018-10-24 – 2018-10-25 (×2): 0.03 [IU]/min via INTRAVENOUS
  Filled 2018-10-24 (×2): qty 2

## 2018-10-24 MED ORDER — ALBUMIN HUMAN 5 % IV SOLN
12.5000 g | Freq: Once | INTRAVENOUS | Status: AC
  Administered 2018-10-24: 12.5 g via INTRAVENOUS
  Filled 2018-10-24: qty 250

## 2018-10-24 MED ORDER — INSULIN DETEMIR 100 UNIT/ML ~~LOC~~ SOLN
42.0000 [IU] | Freq: Two times a day (BID) | SUBCUTANEOUS | Status: DC
  Administered 2018-10-24 – 2018-10-25 (×2): 42 [IU] via SUBCUTANEOUS
  Filled 2018-10-24 (×3): qty 0.42

## 2018-10-24 MED ORDER — STERILE WATER FOR INJECTION IV SOLN
INTRAVENOUS | Status: DC
  Administered 2018-10-24 – 2018-10-25 (×2): via INTRAVENOUS
  Filled 2018-10-24 (×3): qty 850

## 2018-10-24 MED ORDER — EPINEPHRINE 1 MG/10ML IJ SOSY
PREFILLED_SYRINGE | INTRAMUSCULAR | Status: AC
  Filled 2018-10-24: qty 10

## 2018-10-24 MED ORDER — INSULIN ASPART 100 UNIT/ML ~~LOC~~ SOLN
10.0000 [IU] | SUBCUTANEOUS | Status: DC
  Administered 2018-10-24 – 2018-10-25 (×7): 10 [IU] via SUBCUTANEOUS

## 2018-10-24 MED ORDER — SODIUM BICARBONATE 8.4 % IV SOLN
100.0000 meq | Freq: Once | INTRAVENOUS | Status: AC
  Administered 2018-10-24: 100 meq via INTRAVENOUS
  Filled 2018-10-24: qty 100

## 2018-10-24 MED ORDER — HYDROCORTISONE NA SUCCINATE PF 100 MG IJ SOLR
50.0000 mg | Freq: Three times a day (TID) | INTRAMUSCULAR | Status: DC
  Administered 2018-10-24 – 2018-10-25 (×4): 50 mg via INTRAVENOUS
  Filled 2018-10-24 (×4): qty 2

## 2018-10-24 MED ORDER — SODIUM CHLORIDE 0.9 % IV BOLUS
500.0000 mL | Freq: Once | INTRAVENOUS | Status: AC
  Administered 2018-10-24: 500 mL via INTRAVENOUS

## 2018-10-24 NOTE — Progress Notes (Signed)
Assisted daughter with camera/video time via elink 

## 2018-10-24 NOTE — Progress Notes (Signed)
[facility] Progress Note Patient Name: Thomas Gill DOB: May 27, 1955 MRN: [id number]   Date of Service  10/24/2018  HPI/Events of Note  ABG review before goes into Pr[place] for Covid PNA. S/p bronch.   eICU Interventions  P/F improving but still < 100. High PEEP 14. Vt 450.( 8 ml/ibw), rate 35.  Continue care     Intervention Category Intermediate Interventions: Respiratory distress - evaluation and management  Elmer Sow 10/24/2018, 12:07 AM

## 2018-10-24 NOTE — Progress Notes (Signed)
Assisted tele visit to patient with family member.  Dayanna Pryce M, RN  

## 2018-10-24 NOTE — Progress Notes (Signed)
Called eLink to set up video chat for pts daughter due to pt status and impending death.

## 2018-10-24 NOTE — Progress Notes (Signed)
He remained hypotensive Emergent chest x-ray obtained which showed right tension pneumothorax.  Right 28 French chest tube placed emergently at bedside with some improvement in blood pressure. Levophed titrated up to 60 mics, vasopressin added, albumin bolus given Follow-up chest x-ray reviewed which showed expansion of right lung, good tidal volume variation on Pleur-evac  However he remained hypotensive.  I called the daughter and explained that we are on maximal life support, CPR and ACLS measures in this situation would not be helpful. I really do not expect him to survive ABG shows severe respiratory acidosis-we will increase rate back up to 35 and give 2 A of bicarb  Additional critical care time independent of procedure x 33mins  Thomas Gill V. Elsworth Soho MD

## 2018-10-24 NOTE — Progress Notes (Signed)
ANTICOAGULATION CONSULT NOTE - Follow Up Consult  Pharmacy Consult for Heparin Indication: Suspected PE, Bilateral DVTs, CRRT anticoagulation  No Known Allergies  Patient Measurements: Height: 5\' 3"  (160 cm) Weight: 186 lb 11.2 oz (84.7 kg) IBW/kg (Calculated) : 56.9 Heparin Dosing Weight: 79.5 kg  Vital Signs: Temp: 97.3 F (36.3 C) (09/27 1600) Temp Source: Oral (09/27 1600) BP: 105/67 (09/27 2000) Pulse Rate: 107 (09/27 2000)  Labs: Recent Labs    10/22/18 0435  10/23/18 0535  10/23/18 1625 10/23/18 2358 10/24/18 0420 10/24/18 1309 10/24/18 1628 10/24/18 1955  HGB 12.0*   < > 12.0*  --   --  13.9 13.2 14.6  --   --   HCT 40.1   < > 39.4  --   --  41.0 44.7 43.0  --   --   PLT 180  --  177  --   --   --  183  --   --   --   APTT 66*  --  62*  --   --   --  58*  --   --   --   HEPARINUNFRC  --   --  0.33  --   --   --  0.25*  --   --  0.28*  CREATININE 2.02*   < >  --    < > 2.10*  --  1.99*  --  2.06*  --    < > = values in this interval not displayed.    Estimated Creatinine Clearance: 36.7 mL/min (A) (by C-G formula based on SCr of 2.06 mg/dL (H)).  Medications: Infusions:  .  prismasol BGK 4/2.5 400 mL/hr at 10/24/18 1808  .  prismasol BGK 4/2.5 200 mL/hr at 10/24/18 1808  . sodium chloride 10 mL/hr at 10/24/18 1900  .  ceFAZolin (ANCEF) IV Stopped (10/24/18 1049)  . feeding supplement (VITAL 1.5 CAL) 1,000 mL (10/24/18 1600)  . fentaNYL infusion INTRAVENOUS Stopped (10/24/18 1210)  . heparin 1,550 Units/hr (10/24/18 1900)  . midazolam Stopped (10/24/18 1210)  . norepinephrine (LEVOPHED) Adult infusion 37 mcg/min (10/24/18 1900)  . prismasol BGK 0/2.5 1,500 mL/hr at 10/24/18 1605  .  sodium bicarbonate (isotonic) infusion in sterile water 75 mL/hr at 10/24/18 1900  . vasopressin (PITRESSIN) infusion - *FOR SHOCK* 0.03 Units/min (10/24/18 1900)    Assessment: Thomas Gill is a 63 y.o. male presented on 9/14 with worsening O2 saturation, cough, diarrhea,  COVID-19 positive.  On 9/17, patient decompensated requiring intubation. On 9/18 patient had acardiorespiratory arrest/code blueevent that lasted approximately 9 minutes until achieving ROSC.  9/18 Vascular US positive for bilateral DVTs.  Pharmacy is consulted to dose heparin for DVT and suspected PE.  Heparin was held briefly this PM for chest tube placement. It was then resumed. Level came back slightly below goal. We will make a small bump and recheck level in AM.  Goal of Therapy:  Heparin level 0.3-0.5 units/ml Monitor platelets by anticoagulation protocol: Yes   Plan:  Increase heparin gtt to 1600 units/hr Monitor daily heparin level, CBC, s/s of bleed  Onnie Boer, PharmD, [place], AAHIVP, CPP Infectious Disease Pharmacist 10/24/2018 9:04 PM

## 2018-10-24 NOTE — Progress Notes (Signed)
Paged Dr Elsworth Soho to notify of pts hypotension.  Notified MD of pts BP of 86/35 (40).  MD to call and speak with pts daughter regarding code status.

## 2018-10-24 NOTE — Progress Notes (Signed)
NAMEJuriel Gill, MRN:  [id number], DOB:  04/18/1955, LOS: 27 ADMISSION DATE:  10/13/2018, CONSULTATION DATE:  9/15 REFERRING MD:  Ashok Cordia, CHIEF COMPLAINT:  Dyspnea   Brief History   63 y/o male with DM2 admitted with COVID Pneumonia causing severe acute respiratory failure with hypoxemia.  Intubated on 4/27 Course complicated by cardiac arrest, then ATN requiring CRRT, mucous plugging of left lung and right pneumothorax  Past Medical History  Former smoker DM2 Obesity  Significant Hospital Events   9/14 admission 9/17 intubated, proned.  Cardiac arrest later in day while he was in prone position.  Central line placed.  Started on Levophed drip 9/18- Paralyzed for vent dysynchrony 9/19- Started heparin for DVT 9/20-bronchoscopy for mucous plugging, left hemithorax opacification 9/21-starting CVVH, restart proning 9/24 R pneumothorax  Consults:  PCCM Nephrology  Procedures:  ETT 9/17>>> Femoral CVL 9/17 >> 9/20 Lt IJ CVL 9/20 >> Rt fem a line 9/21 >> Rt IJ HD cath 9/25 >> Right chest tube 9/24 >>  Significant Diagnostic Tests:  Lower extremity duplex 9/19> age indeterminate bilateral DVT  Micro Data:  9/14 SARS COV 2 positive BAL 9/20 >> proteus, MSSA BAL 9/20 AFB, Fungus > neg  Antimicrobials/COVID Rx  9/14 Decadron >  9/14 Remdesivir >  9/18  Vancomycin 9/21> 9/22 Cefepime 9/21>9/25 Ancef 9/25 >>  Interim history/subjective:   He is critically ill, hypothermic on CRRT Levophed requirements have increased to 25 mics overnight CVP remains 10-15 range. Prone position FiO2 decreased to 80%, PEEP remains at 14    Objective   Blood pressure (!) 91/55, pulse 85, temperature (!) 94.3 F (34.6 C), temperature source Axillary, resp. rate (!) 36, height _0  (1.6 m), weight 84.7 kg, SpO2 97 %. CVP:  [7 mmHg-16 mmHg] 15 mmHg  Vent Mode: PRVC FiO2 (%):  [80 %-90 %] 80 % Set Rate:  [30 bmp-35 bmp] 30 bmp Vt Set:  [450 mL] 450 mL PEEP:  [14 cmH20] 14 cmH20  Plateau Pressure:  [25 cmH20-36 cmH20] 33 cmH20   Intake/Output Summary (Last 24 hours) at 10/24/2018 1023 Last data filed at 10/24/2018 1010 Gross per 24 hour  Intake 3243.54 ml  Output 5490 ml  Net -2246.46 ml   Filed Weights   10/22/18 0300 10/23/18 0500 10/24/18 0500  Weight: 91.3 kg 90.5 kg 84.7 kg    Examination:  General: Acutely ill, appears older than stated age, no distress, on vent, prone HENT: NCAT ETT in place PULM: CTA B, no rhonchi, no accessory muscle use, right chest tube did not have air leak but after flushing and milking tube, leak noted CV: RRR, no mgr GI: BS+, soft, nontender MSK: normal bulk and tone Neuro: sedated on Versed and fentanyl, RA SS -3   Chest x-ray 9/27 personally reviewed which shows right pigtail chest tube in position, moderate right pneumothorax, stable bilateral infiltrates  Labs show worsening leukocytosis, mild hypokalemia and elevated sugars. ABG shows improving hypoxia, respiratory acidosis  Resolved Hospital Problem list     Assessment & Plan:  ARDS due to COVID 19 pneumonia Continue low tidal volume ventilation per ARDS protocol Target plateau less than 30 and driving pressure less than 15 Decrease respiratory rate to 30 due to severe auto PEEP Target PaO2 55-65: titrate PEEP/FiO2 per protocol As long as PaO2 to FiO2 ratio is less than 1:150 position in prone position for 16 hours a day Ventilator associated pneumonia prevention protocol  9/24 new right pneumothorax status post pigtail 9/27 pneumothorax appears worse, after  flushing chest tube and milking purulent clot, much better air leak noted -Keep chest tube to suction -20 cm  Septic shock -increasing Levophed requirements  -Add vasopressin -Albumin 12.5 g x 1  AKI, oliguric CVVHD per renal Increasing Levophed requirements, start to keep even  HCAP: proteus, MSSA - Ancef complete 7 days  Cardiac arrest, PE? Lower extremity DVT Heparin infusion  Acute  encephalopathy Need for sedation for mechanical ventilation RASS target -4 to -5 with Versed and fentanyl drip Intermittent vecuronium protocol -At some point, would need neurological assessment prior to committing to trach  Shock liver -resolving  Uncontrolled hyperglycemia: Levemir/SSI per [facility]  Best practice:  Diet: tube feeding Pain/Anxiety/Delirium protocol (if indicated): as above VAP protocol (if indicated): yes DVT prophylaxis: heparin infusion GI prophylaxis: famotidine Glucose control: SSI Mobility: bed rest Code Status: full Family Communication:  nephew in [place] Jonathan Garcia [phone]) 9/25 ;  daughter, Nochelli [phone]) -last updated by nurse 9/26 Disposition:  ICU   The patient is critically ill with multiple organ systems failure and requires high complexity decision making for assessment and support, frequent evaluation and titration of therapies, application of advanced monitoring technologies and extensive interpretation of multiple databases. Critical Care Time devoted to patient care services described in this note independent of APP/resident  time is 35 minutes.    Kara Mead MD. Shade Flood. [facility] Pager [phone] If no response call [phone]   10/24/2018

## 2018-10-24 NOTE — Procedures (Signed)
Chest Tube Insertion Procedure Note  Indications:  Clinically significant Pneumothorax  Pre-operative Diagnosis: Pneumothorax  Post-operative Diagnosis: Pneumothorax  Procedure Details  Informed consent was obtained for the procedure, including sedation.  Risks of lung perforation, hemorrhage, arrhythmia, and adverse drug reaction were discussed.   After sterile skin prep, using standard technique, a 28 French tube was placed in the right lateral 5th rib space.  Findings: 20 ml of serosanguinous fluid obtained Gush of air  Estimated Blood Loss:  less than 50 mL         Specimens:  None              Complications:  None; patient tolerated the procedure well.         Disposition: ICU - intubated and critically ill.         Condition: unstable  Attending Attestation: I performed the procedure.  Kara Mead MD. Shade Flood. [facility] Pager [phone] If no response call [phone]   10/24/2018

## 2018-10-24 NOTE — Progress Notes (Signed)
[facility] TEAM 1 - Stepdown/ICU TEAM  Thomas Gill  QQV:[id number] DOB: 12-Nov-1955 DOA: 10/27/2018 PCP: Patient, No Pcp Per    Brief Narrative:  62 y.o.malediagnosed with COVID-19 09/29/2018 who presented to the ED with progressive shortness of breath, cough and diarrhea. When EMS arrived at his house he was satting in the 60s on room air.  Patient was admitted to the ICU and intubated within 24 hours of arrival. Hospital course complicated by cardiopulmonary arrest on 9/17 followed by AKI now requiring CRRT.  Significant Events: 9/14 admitted 9/15 intubated 9/17 cardiopulmonary arrest, asystole, ROSC in 9 minutes per chart 9/17 femoral line 9/18 DVT found, started on heparin 9/20 DC femoral line, insert left IJ 9/20 bronchoscopy for mucous plugging 9/21 hemodialysis catheter placement, start CRRT 9/24 spontaneous PTX - s/p R chest tube   COVID-19 specific Treatment: Remdesivir 9/14 > 9/18 Decadron 9/14 > 9/25  Subjective: Remains pressor dependent with dip in blood pressure earlier this morning.  His chest x-ray this morning notes reaccumulation of the right-sided pneumothorax.  Constant his pigtail catheter were cleared with improved airflow but later in the morning the patient experienced rapid decline with hypotension in the follow-up stat CXR confirmed a large reaccumulated right pneumothorax.  A large bore right-sided chest tube was then placed.  The patient experienced recovery of his blood pressure and stabilize somewhat immediately after placement of the chest tube.  A follow-up chest x-ray after the chest tube noted resolution of the pneumothorax.  Overall the patient remains critically ill.  It is not felt that he will likely recover.  Given the severity of the patient's current acute illness, and the fact that he experienced a prolonged arrest earlier in his hospital stay, I do not feel that ongoing resuscitative efforts in the setting of an acute recurrent arrest would be  appropriate.  I agree that NO CODE STATUS is most appropriate and humane.  Assessment & Plan:  COVID Pneumonia - ARDS - Acute hypoxic respiratory failure Vent management per PCCM -has completed a course of Remdesivir and steroids  Acute R tension PTX -recurrent today Noted on rounds 9/24 - s/p pigtail chest tube initially -recurrent 9/27 requiring placement of full size chest tube  Cardiac arrest - asystole 9/17 ROSC in 9 mins after epi x3 - ?due to PE  Proteus and Staph aureus tracheobronchitis/pneumonia Focused antibiotic tx continues - follow clinically  Hemoptysis Felt to be due to PE - appears to have resolved   Acute DVT Noted on venous doppler 9/18 - on heparin anticoag  Acute kidney failure  Felt to be ATN due to cardiac arrest - cont CRRT - Nephrology following   Transaminitis  Likely due to COVID + shock liver - follow  DM2 CBG remains poorly controlled - tx adjusted again today - follow response  DVT prophylaxis: IV heparin Code Status: DNR Family Communication:  Disposition Plan: ICU  Consultants:  PCCM  Antimicrobials:  Cefepime 9/21 > 9/25 Cefazolin 9/25 > Vancomycin 9/21 > 9/22  Objective: Blood pressure 98/79, pulse 97, temperature 98.4 F (36.9 C), temperature source Oral, resp. rate (!) 35, height 5' 3" (1.6 m), weight 84.7 kg, SpO2 96 %.  Intake/Output Summary (Last 24 hours) at 10/24/2018 0846 Last data filed at 10/24/2018 0800 Gross per 24 hour  Intake 3354.18 ml  Output 5662 ml  Net -2307.82 ml   Filed Weights   10/22/18 0300 10/23/18 0500 10/24/18 0500  Weight: 91.3 kg 90.5 kg 84.7 kg    Examination: General: Sedated  on ventilator -appears critically ill Lungs: Faint diffuse crackles - no wheezing  Cardiovascular: RRR  Abdomen: Nondistended, soft, no mass Extremities: trace B LE edema   CBC: Recent Labs  Lab 10/22/18 0435  10/23/18 0535 10/23/18 2358 10/24/18 0420  WBC 20.3*  --  18.5*  --  21.5*  HGB 12.0*   < > 12.0*  13.9 13.2  HCT 40.1   < > 39.4 41.0 44.7  MCV 102.0*  --  99.2  --  102.5*  PLT 180  --  177  --  183   < > = values in this interval not displayed.   Basic Metabolic Panel: Recent Labs  Lab 10/22/18 0435  10/23/18 0535 10/23/18 0536 10/23/18 1625 10/23/18 2358 10/24/18 0420  NA 135   < >  --  136 136 133* 137  K 5.2*   < >  --  4.5 4.0 4.6 5.2*  CL 100   < >  --  99 101  --  100  CO2 26   < >  --  23 20*  --  22  GLUCOSE 236*   < >  --  271* 302*  --  243*  BUN 66*   < >  --  61* 56*  --  56*  CREATININE 2.02*   < >  --  1.93* 2.10*  --  1.99*  CALCIUM 8.4*   < >  --  8.1* 8.4*  --  8.6*  MG 2.8*  --  2.8*  --   --   --  3.2*  PHOS 5.1*   < >  --  3.5 4.5  --  5.8*   < > = values in this interval not displayed.   GFR: Estimated Creatinine Clearance: 38 mL/min (A) (by C-G formula based on SCr of 1.99 mg/dL (H)).  Liver Function Tests: Recent Labs  Lab 10/18/18 0443  10/22/18 0435  10/23/18 0535 10/23/18 0536 10/23/18 1625 10/24/18 0420  AST 43*  --  96*  --  54*  --   --   --   ALT 47*  --  112*  --  85*  --   --   --   ALKPHOS 92  --  147*  --  148*  --   --   --   BILITOT 0.5  --  0.5  --  0.5  --   --   --   PROT 5.8*  --  7.5  --  7.8  --   --   --   ALBUMIN 1.7*   < > 2.0*  2.0*   < > 2.1* 2.1* 2.2* 2.1*   < > = values in this interval not displayed.    HbA1C: Hgb A1c MFr Bld  Date/Time Value Ref Range Status  10/10/2018 02:30 PM 9.1 (H) 4.8 - 5.6 % Final    Comment:    (NOTE)         Prediabetes: 5.7 - 6.4         Diabetes: >6.4         Glycemic control for adults with diabetes: <7.0     CBG: Recent Labs  Lab 10/23/18 1601 10/23/18 1937 10/23/18 2355 10/24/18 0400 10/24/18 0743  GLUCAP 291* 296* 336* 246* 258*    Recent Results (from the past 240 hour(s))  Culture, bal-quantitative     Status: Abnormal   Collection Time: 10/17/18 11:37 AM   Specimen: Bronchoalveolar Lavage; Respiratory  Result Value Ref Range  Status   Specimen  Description   Final    BRONCHIAL ALVEOLAR LAVAGE Performed at [facility] [address] [place], [place] [address]    Special Requests   Final    NONE Performed at [facility], [place] [address] [place], [place] [address]    Gram Stain   Final    FEW WBC PRESENT, PREDOMINANTLY MONONUCLEAR MODERATE GRAM POSITIVE COCCI FEW GRAM NEGATIVE COCCI RARE GRAM NEGATIVE RODS Performed at [facility], [place] [address] [place], [place] [address]    Culture (A)  Final    >=100,000 COLONIES/mL PROTEUS MIRABILIS >=100,000 COLONIES/mL STAPHYLOCOCCUS AUREUS    Report Status 10/22/2018 FINAL  Final   Organism ID, Bacteria PROTEUS MIRABILIS (A)  Final   Organism ID, Bacteria STAPHYLOCOCCUS AUREUS (A)  Final      Susceptibility   Proteus mirabilis - MIC*    AMPICILLIN <=2 SENSITIVE Sensitive     CEFAZOLIN <=4 SENSITIVE Sensitive     CEFEPIME <=1 SENSITIVE Sensitive     CEFTAZIDIME <=1 SENSITIVE Sensitive     CEFTRIAXONE <=1 SENSITIVE Sensitive     CIPROFLOXACIN <=0.25 SENSITIVE Sensitive     GENTAMICIN <=1 SENSITIVE Sensitive     IMIPENEM 1 SENSITIVE Sensitive     TRIMETH/SULFA <=20 SENSITIVE Sensitive     AMPICILLIN/SULBACTAM <=2 SENSITIVE Sensitive     PIP/TAZO <=4 SENSITIVE Sensitive     * >=100,000 COLONIES/mL PROTEUS MIRABILIS   Staphylococcus aureus - MIC*    CIPROFLOXACIN <=0.5 SENSITIVE Sensitive     ERYTHROMYCIN >=8 RESISTANT Resistant     GENTAMICIN <=0.5 SENSITIVE Sensitive     OXACILLIN <=0.25 SENSITIVE Sensitive     TETRACYCLINE <=1 SENSITIVE Sensitive     VANCOMYCIN 1 SENSITIVE Sensitive     TRIMETH/SULFA <=10 SENSITIVE Sensitive     CLINDAMYCIN RESISTANT Resistant     RIFAMPIN <=0.5 SENSITIVE Sensitive     Inducible Clindamycin POSITIVE Resistant     * >=100,000 COLONIES/mL STAPHYLOCOCCUS AUREUS  Acid Fast Smear (AFB)     Status: None   Collection Time: 10/17/18 11:38 AM   Specimen: Bronchial Alveolar Lavage; Respiratory   Result Value Ref Range Status   AFB Specimen Processing Concentration  Final   Acid Fast Smear Negative  Final    Comment: (NOTE) Performed At: [facility] [place], [place] [id number] Rush Farmer MD [id number]    Source (AFB) BRONCHIAL ALVEOLAR LAVAGE  Final    Comment: Performed at [facility] [address], [place], [place] [address]  Fungus Culture With Stain     Status: None (Preliminary result)   Collection Time: 10/17/18 11:38 AM  Result Value Ref Range Status   Fungus Stain Final report  Final    Comment: (NOTE) Performed At: [facility] [place], [place] [id number] Rush Farmer MD AN:[id number]    Fungus (Mycology) Culture PENDING  Incomplete   Fungal Source BRONCHIAL ALVEOLAR LAVAGE  Final    Comment: Performed at [facility], [place] [address] [place],  [address]  Fungus Culture Result     Status: None   Collection Time: 10/17/18 11:38 AM  Result Value Ref Range Status   Result 1 Comment  Final    Comment: (NOTE) KOH/Calcofluor preparation:  no fungus observed. Performed At: [facility] [place], [place] [id number] Rush Farmer MD YE:[id number]   MRSA PCR Screening     Status: None   Collection Time: 10/18/18 11:48 AM  Specimen: Nasal Mucosa; Nasopharyngeal  Result Value Ref Range Status   MRSA by PCR NEGATIVE NEGATIVE Final    Comment:        The GeneXpert MRSA Assay (FDA approved for NASAL specimens only), is one component of a comprehensive MRSA colonization surveillance program. It is not intended to diagnose MRSA infection nor to guide or monitor treatment for MRSA infections. Performed at [facility] [place], [place] [address], [place],  [address]      Scheduled Meds: . acetylcysteine  4 mL Nebulization Q6H  . albuterol  2.5 mg Nebulization Q6H  . artificial tears  1 application Both Eyes O5D  .  chlorhexidine gluconate (MEDLINE KIT)  15 mL Mouth Rinse BID  . Chlorhexidine Gluconate Cloth  6 each Topical Daily  . famotidine  20 mg Per Tube QHS  . feeding supplement (PRO-STAT SUGAR FREE 64)  60 mL Per Tube TID  . guaiFENesin  15 mL Oral Q6H  . insulin aspart  0-20 Units Subcutaneous Q4H  . insulin aspart  6 Units Subcutaneous Q4H  . insulin detemir  38 Units Subcutaneous BID  . mouth rinse  15 mL Mouth Rinse 10 times per day  . multivitamin with minerals  1 tablet Per Tube Daily  . oxyCODONE  5 mg Per Tube Q6H  . polyethylene glycol  17 g Per Tube BID  . sennosides  5 mL Per Tube BID  . sodium chloride flush  10-40 mL Intracatheter Q12H   Continuous Infusions: .  prismasol BGK 4/2.5 400 mL/hr at 10/23/18 1801  .  prismasol BGK 4/2.5 200 mL/hr at 10/23/18 1801  . sodium chloride 10 mL/hr at 10/24/18 0800  .  ceFAZolin (ANCEF) IV 2 g (10/23/18 2142)  . feeding supplement (VITAL 1.5 CAL) 1,000 mL (10/23/18 1600)  . fentaNYL infusion INTRAVENOUS Stopped (10/24/18 0704)  . heparin 1,550 Units/hr (10/24/18 0800)  . midazolam Stopped (10/24/18 0704)  . norepinephrine (LEVOPHED) Adult infusion 21 mcg/min (10/24/18 0800)  . prismasol BGK 4/2.5 1,700 mL/hr at 10/24/18 0744     LOS: 46 days   Cherene Altes, MD [facility] Office  [phone] Pager - Text Page per Amion  If 7PM-7AM, please contact night-coverage per Amion 10/24/2018, 8:46 AM

## 2018-10-24 NOTE — Progress Notes (Signed)
Patient placed in prone position at 0200 per protocol without incident.

## 2018-10-24 NOTE — Progress Notes (Signed)
ANTICOAGULATION CONSULT NOTE - Follow Up Consult  Pharmacy Consult for Heparin Indication: Suspected PE, Bilateral DVTs, CRRT anticoagulation  No Known Allergies  Patient Measurements: Height: 5\' 3"  (160 cm) Weight: 186 lb 11.2 oz (84.7 kg) IBW/kg (Calculated) : 56.9 Heparin Dosing Weight: 79.5 kg  Vital Signs: Temp: 98.4 F (36.9 C) (09/27 0400) Temp Source: Oral (09/27 0400) BP: 93/64 (09/27 0500) Pulse Rate: 98 (09/27 0500)  Labs: Recent Labs    10/22/18 0120  10/22/18 0435  10/22/18 1630  10/23/18 0535 10/23/18 0536 10/23/18 1625 10/23/18 2358 10/24/18 0420  HGB  --    < > 12.0*   < >  --    < > 12.0*  --   --  13.9 13.2  HCT  --    < > 40.1   < >  --    < > 39.4  --   --  41.0 44.7  PLT  --   --  180  --   --   --  177  --   --   --  183  APTT  --   --  66*  --   --   --  62*  --   --   --   --   HEPARINUNFRC 0.39  --   --   --   --   --  0.33  --   --   --  0.25*  CREATININE  --   --  2.02*  --  2.04*  --   --  1.93* 2.10*  --   --    < > = values in this interval not displayed.    Estimated Creatinine Clearance: 36 mL/min (A) (by C-G formula based on SCr of 2.1 mg/dL (H)).  Medications: Infusions:  .  prismasol BGK 4/2.5 400 mL/hr at 10/23/18 1801  .  prismasol BGK 4/2.5 200 mL/hr at 10/23/18 1801  . sodium chloride 10 mL/hr at 10/24/18 0500  .  ceFAZolin (ANCEF) IV 2 g (10/23/18 2142)  . feeding supplement (VITAL 1.5 CAL) 1,000 mL (10/23/18 1600)  . fentaNYL infusion INTRAVENOUS 250 mcg/hr (10/24/18 0500)  . heparin 1,450 Units/hr (10/24/18 0500)  . midazolam 4 mg/hr (10/24/18 0500)  . norepinephrine (LEVOPHED) Adult infusion 6 mcg/min (10/24/18 0500)  . prismasol BGK 4/2.5 1,700 mL/hr at 10/24/18 0200    Assessment: Thomas Gill is a 63 y.o. male presented on 9/14 with worsening O2 saturation, cough, diarrhea, COVID-19 positive.  On 9/17, patient decompensated requiring intubation. On 9/18 patient had acardiorespiratory arrest/code blueevent that  lasted approximately 9 minutes until achieving ROSC.  9/18 Vascular US positive for bilateral DVTs.  Pharmacy is consulted to dose heparin for DVT and suspected PE.  Heparin level down to 0.25 after being borderline therapeutic yesterday. No issues noted. CBC ok.  Goal of Therapy:  Heparin level 0.3-0.5 units/ml Monitor platelets by anticoagulation protocol: Yes   Plan:  Increase heparin gtt to 1,550 units/hr Monitor daily heparin level, CBC, s/s of bleed  Elenor Quinones, PharmD, BCPS, BCIDP Clinical Pharmacist 10/24/2018 5:41 AM

## 2018-10-24 NOTE — Progress Notes (Signed)
Notified Dr Elsworth Soho that pt is currently on 21 mcg/min levophed, BP 91/68 (76) - sedation on hold & not pulling fluid on CRRT.  MD ordered to continue to keep patient even and not pull additional fluid on CRRT.  No need to start vasopressin at this time unless levophed requirements increase.

## 2018-10-24 NOTE — Progress Notes (Signed)
[facility] NEPHROLOGY PROGRESS NOTE  Assessment/ Plan: Pt is a 63 y.o. yo male with a COVID-19 presented with shortness of breath, cough diarrhea.  He had cardiopulmonary arrest on 9/17 with ROSC 9 minutes.  He is intubated and now with worsening renal failure and hyperkalemia.  Problems:  #Nonoliguric AKI - likely ATN after cardiac arrest and COVID-19 infection. Ultrasound showed large kidneys without obstruction.  Started CRRT on 10/18/2018. Verbal consent obtained from family in [place] for catheter placement and dialysis treatment.   -Left IJ temporary catheter placed by PCCM on 9/21 - sepsis worse now and on levo gtt at 25 ug/min > changing to keep even per CCM - down 10- 15 kg over the last 6 days if wt's accurate - Getting systemic IV heparin at 1550u/ hr for anticoagulation for DVT  - getting IV bicarb gtt at 75 cc/hr  #Hyperkalemia: recurrent on 4K CRRT fluids.  Will change dialysate back to 0K. Monitor lab  #COVID-19 pneumonia/ARDS/vent dependent respiratory failure:  High FiO2 with poor oxygenation.  #Cardiac arrest on 9/17: 9 minutes ROSC.  #DVT: IV heparin, per pulmonary.  #Elevated LFTs, shock liver.  Kelly Splinter, MD 10/24/2018, 3:57 PM     Subjective: Chart and lab results reviewed.   Exam:  Patient not examined directly given COVID-19 + status, utilizing exam of the primary team and observations of RN's.      Objective Vital signs in last 24 hours: Vitals:   10/24/18 1500 10/24/18 1515 10/24/18 1530 10/24/18 1545  BP: (!) 121/59 118/74 124/71 122/70  Pulse: 92 93 92 90  Resp:      Temp:      TempSrc:      SpO2: 99% 98% 98% 100%  Weight:      Height:       Weight change: -5.813 kg  Intake/Output Summary (Last 24 hours) at 10/24/2018 1557 Last data filed at 10/24/2018 1500 Gross per 24 hour  Intake 4146.26 ml  Output 5214 ml  Net -1067.74 ml       Labs: Basic Metabolic Panel: Recent Labs  Lab 10/23/18 0536 10/23/18 1625  10/23/18 2358 10/24/18 0420 10/24/18 1309  NA 136 136 133* 137 136  K 4.5 4.0 4.6 5.2* 5.5*  CL 99 101  --  100  --   CO2 23 20*  --  22  --   GLUCOSE 271* 302*  --  243*  --   BUN 61* 56*  --  56*  --   CREATININE 1.93* 2.10*  --  1.99*  --   CALCIUM 8.1* 8.4*  --  8.6*  --   PHOS 3.5 4.5  --  5.8*  --    Liver Function Tests: Recent Labs  Lab 10/18/18 0443  10/22/18 0435  10/23/18 0535 10/23/18 0536 10/23/18 1625 10/24/18 0420  AST 43*  --  96*  --  54*  --   --   --   ALT 47*  --  112*  --  85*  --   --   --   ALKPHOS 92  --  147*  --  148*  --   --   --   BILITOT 0.5  --  0.5  --  0.5  --   --   --   PROT 5.8*  --  7.5  --  7.8  --   --   --   ALBUMIN 1.7*   < > 2.0*  2.0*   < > 2.1* 2.1*  2.2* 2.1*   < > = values in this interval not displayed.   No results for input(s): LIPASE, AMYLASE in the last 168 hours. No results for input(s): AMMONIA in the last 168 hours. CBC: Recent Labs  Lab 10/20/18 0456  10/21/18 0530  10/22/18 0435  10/23/18 0535 10/23/18 2358 10/24/18 0420 10/24/18 1309  WBC 12.5*  --  16.1*  --  20.3*  --  18.5*  --  21.5*  --   HGB 11.3*  20.1*   < > 11.5*   < > 12.0*   < > 12.0* 13.9 13.2 14.6  HCT 37.4*  59.0*   < > 37.7*   < > 40.1   < > 39.4 41.0 44.7 43.0  MCV 100.5*  --  100.3*  --  102.0*  --  99.2  --  102.5*  --   PLT 176  --  177  --  180  --  177  --  183  --    < > = values in this interval not displayed.   Cardiac Enzymes: No results for input(s): CKTOTAL, CKMB, CKMBINDEX, TROPONINI in the last 168 hours. CBG: Recent Labs  Lab 10/23/18 1937 10/23/18 2355 10/24/18 0400 10/24/18 0743 10/24/18 1327  GLUCAP 296* 336* 246* 258* 197*    Iron Studies: No results for input(s): IRON, TIBC, TRANSFERRIN, FERRITIN in the last 72 hours. Studies/Results: Dg Chest Port 1 View  Result Date: 10/24/2018 CLINICAL DATA:  Status post chest tube placement EXAM: PORTABLE CHEST 1 VIEW COMPARISON:  October 24, 2018 FINDINGS: Two right  chest tubes are identified. A new right chest tube terminates near the right apex. A small right apical pneumothorax remains, particularly towards the base, much smaller in the interval. No left-sided pneumothorax. The ETT is in good position. The right and left central lines are in good position. A feeding tube terminates below today's film. Opacity remains diffusely throughout the right lung and in the left base. IMPRESSION: 1. Interval placement of a right-sided chest tube. The right-sided pneumothorax is much smaller in the interval with only a small pneumothorax remaining, particularly towards the base. 2. Other support apparatus as above. 3. Opacity remains throughout the right lung and in the left base. Recommend follow-up to resolution. Electronically Signed   By: Dorise Bullion III M.D   On: 10/24/2018 13:30   Dg Chest Port 1 View  Result Date: 10/24/2018 CLINICAL DATA:  Right pneumothorax. EXAM: PORTABLE CHEST 1 VIEW COMPARISON:  Radiograph of same day. FINDINGS: The heart size and mediastinal contours are within normal limits. Endotracheal and feeding tubes are unchanged in position. Bilateral jugular catheters are unchanged. Stable position of right-sided chest tube is noted. There is large right pneumothorax present, with atelectasis of the right lung. Mild left basilar atelectasis is noted. The visualized skeletal structures are unremarkable. IMPRESSION: Large right pneumothorax is noted which is significantly enlarged compared to prior exam. Right-sided chest tube is unchanged in position. Stable support apparatus. Electronically Signed   By: Marijo Conception M.D.   On: 10/24/2018 13:02   Dg Chest Port 1 View  Result Date: 10/24/2018 CLINICAL DATA:  Acute respiratory failure. EXAM: PORTABLE CHEST 1 VIEW COMPARISON:  Radiograph of October 23, 2018. FINDINGS: The heart size and mediastinal contours are within normal limits. Endotracheal and feeding tubes are unchanged in position. Bilateral  jugular catheters are unchanged in position. Stable position of right-sided chest tube. Significantly increased right pneumothorax is noted. Small right pleural effusion is noted. Bibasilar opacities  are again noted. The visualized skeletal structures are unremarkable. IMPRESSION: Moderate size right pneumothorax is noted which is significantly increased compared to prior exam. Stable position of right-sided chest tube. Stable bilateral lung opacities. Stable support apparatus. Electronically Signed   By: Marijo Conception M.D.   On: 10/24/2018 08:40   Dg Chest Port 1 View  Result Date: 10/23/2018 CLINICAL DATA:  Right-sided pneumothorax. EXAM: PORTABLE CHEST 1 VIEW COMPARISON:  10/22/2018 FINDINGS: 0558 hours. Endotracheal tube tip is 2.5 cm above the base of the carina. Right IJ central line tip overlies the innominate vein confluence. The left IJ central line tip overlies the proximal SVC. A feeding tube passes into the stomach although the distal tip position is not included on the film. Right pleural drain remains in place. Small apically pneumothorax noted on the right. Persistent low lung volumes. The cardio pericardial silhouette is enlarged. Diffuse interstitial and patchy basilar airspace disease is similar to prior. IMPRESSION: 1. Tiny right apical pneumothorax with right pleural drain in situ. 2. Similar diffuse interstitial and patchy basilar airspace disease. 3. Cardiomegaly. Electronically Signed   By: Misty Stanley M.D.   On: 10/23/2018 09:18    Medications: Infusions: .  prismasol BGK 4/2.5 400 mL/hr at 10/23/18 1801  .  prismasol BGK 4/2.5 200 mL/hr at 10/23/18 1801  . sodium chloride 10 mL/hr at 10/24/18 1500  .  ceFAZolin (ANCEF) IV Stopped (10/24/18 1049)  . feeding supplement (VITAL 1.5 CAL) 1,000 mL (10/23/18 1600)  . fentaNYL infusion INTRAVENOUS Stopped (10/24/18 1210)  . heparin 1,550 Units/hr (10/24/18 1500)  . midazolam Stopped (10/24/18 1210)  . norepinephrine (LEVOPHED)  Adult infusion 50 mcg/min (10/24/18 1500)  . prismasol BGK 0/2.5    .  sodium bicarbonate (isotonic) infusion in sterile water    . vasopressin (PITRESSIN) infusion - *FOR SHOCK* 0.03 Units/min (10/24/18 1500)    Scheduled Medications: . acetylcysteine  4 mL Nebulization Q6H  . albuterol  2.5 mg Nebulization Q6H  . artificial tears  1 application Both Eyes H8I  . chlorhexidine gluconate (MEDLINE KIT)  15 mL Mouth Rinse BID  . Chlorhexidine Gluconate Cloth  6 each Topical Daily  . EPINEPHrine      . famotidine  20 mg Per Tube QHS  . feeding supplement (PRO-STAT SUGAR FREE 64)  60 mL Per Tube TID  . guaiFENesin  15 mL Oral Q6H  . hydrocortisone sod succinate (SOLU-CORTEF) inj  50 mg Intravenous Q8H  . insulin aspart  0-20 Units Subcutaneous Q4H  . insulin aspart  10 Units Subcutaneous Q4H  . insulin detemir  42 Units Subcutaneous BID  . mouth rinse  15 mL Mouth Rinse 10 times per day  . multivitamin with minerals  1 tablet Per Tube Daily  . oxyCODONE  5 mg Per Tube Q6H  . polyethylene glycol  17 g Per Tube BID  . sennosides  5 mL Per Tube BID  . sodium chloride flush  10-40 mL Intracatheter Q12H    have reviewed scheduled and prn medications.  Sandy Salaam Shanterria Franta 10/24/2018,3:57 PM  LOS: 13 days

## 2018-10-24 NOTE — Progress Notes (Signed)
Dr Elsworth Soho called to bedside for pts hypotension - BP 54/31 (39) on 40 mcg/min of levophed.  Stat CXR ordered.  MD at bedside to evaluate patient.  Verbal order to increase levophed to 50 mcg/min.  Pt unproned and prepared for chest tube insertion at bedside.  No further titration on levophed despite pressures per MD.Chest tube inserted successfully.  Pt stabilized.

## 2018-10-24 NOTE — Progress Notes (Signed)
Updated daughter, Smiley Houseman, over the phone with interpreter. Video Chat set up with [place] for family at this time.

## 2018-10-24 NOTE — Progress Notes (Signed)
Emergently unproned pt for BP issues.  Placed patient on 100% prior to unprone.  After chest tube insertion, I removed tape and protective bandages on face and replaced with [employer] ETT holder.  Skin looks good with no break down on cheeks, lips, forehead.

## 2018-10-24 NOTE — Progress Notes (Signed)
Dr Elsworth Soho ordered to flush pts chest tube with 30 cc of normal saline & hook up to suction.  MD to round shortly.

## 2018-10-24 NOTE — Progress Notes (Signed)
Spoke with pts daughter Nocheli via interpreter.  Notified that pt is still critically ill and requiring high doses of medication to keep his blood pressure elevated.  All questions answered.  Told daughter we would call if pts status declined overnight.  Plan to video chat tomorrow when convenient for her.

## 2018-10-25 ENCOUNTER — Inpatient Hospital Stay (HOSPITAL_COMMUNITY): Payer: Medicaid Other

## 2018-10-25 DIAGNOSIS — U071 COVID-19: Secondary | ICD-10-CM | POA: Diagnosis not present

## 2018-10-25 DIAGNOSIS — I469 Cardiac arrest, cause unspecified: Secondary | ICD-10-CM | POA: Diagnosis not present

## 2018-10-25 DIAGNOSIS — N179 Acute kidney failure, unspecified: Secondary | ICD-10-CM | POA: Diagnosis not present

## 2018-10-25 DIAGNOSIS — J9601 Acute respiratory failure with hypoxia: Secondary | ICD-10-CM | POA: Diagnosis not present

## 2018-10-25 DIAGNOSIS — J939 Pneumothorax, unspecified: Secondary | ICD-10-CM

## 2018-10-25 LAB — CBC
HCT: 42.5 % (ref 39.0–52.0)
Hemoglobin: 12.3 g/dL — ABNORMAL LOW (ref 13.0–17.0)
MCH: 29.9 pg (ref 26.0–34.0)
MCHC: 28.9 g/dL — ABNORMAL LOW (ref 30.0–36.0)
MCV: 103.2 fL — ABNORMAL HIGH (ref 80.0–100.0)
Platelets: 119 10*3/uL — ABNORMAL LOW (ref 150–400)
RBC: 4.12 MIL/uL — ABNORMAL LOW (ref 4.22–5.81)
RDW: 15.1 % (ref 11.5–15.5)
WBC: 20.9 10*3/uL — ABNORMAL HIGH (ref 4.0–10.5)
nRBC: 24.2 % — ABNORMAL HIGH (ref 0.0–0.2)

## 2018-10-25 LAB — POCT I-STAT 7, (LYTES, BLD GAS, ICA,H+H)
Acid-base deficit: 5 mmol/L — ABNORMAL HIGH (ref 0.0–2.0)
Bicarbonate: 22.8 mmol/L (ref 20.0–28.0)
Calcium, Ion: 1.07 mmol/L — ABNORMAL LOW (ref 1.15–1.40)
HCT: 40 % (ref 39.0–52.0)
Hemoglobin: 13.6 g/dL (ref 13.0–17.0)
O2 Saturation: 92 %
Patient temperature: 98.6
Potassium: 4.7 mmol/L (ref 3.5–5.1)
Sodium: 135 mmol/L (ref 135–145)
TCO2: 24 mmol/L (ref 22–32)
pCO2 arterial: 54.8 mmHg — ABNORMAL HIGH (ref 32.0–48.0)
pH, Arterial: 7.227 — ABNORMAL LOW (ref 7.350–7.450)
pO2, Arterial: 77 mmHg — ABNORMAL LOW (ref 83.0–108.0)

## 2018-10-25 LAB — MAGNESIUM: Magnesium: 3.3 mg/dL — ABNORMAL HIGH (ref 1.7–2.4)

## 2018-10-25 LAB — RENAL FUNCTION PANEL
Albumin: 2.1 g/dL — ABNORMAL LOW (ref 3.5–5.0)
Anion gap: 22 — ABNORMAL HIGH (ref 5–15)
BUN: 55 mg/dL — ABNORMAL HIGH (ref 8–23)
CO2: 19 mmol/L — ABNORMAL LOW (ref 22–32)
Calcium: 8.1 mg/dL — ABNORMAL LOW (ref 8.9–10.3)
Chloride: 97 mmol/L — ABNORMAL LOW (ref 98–111)
Creatinine, Ser: 2.04 mg/dL — ABNORMAL HIGH (ref 0.61–1.24)
GFR calc Af Amer: 40 mL/min — ABNORMAL LOW (ref >60)
GFR calc non Af Amer: 34 mL/min — ABNORMAL LOW (ref >60)
Glucose, Bld: 202 mg/dL — ABNORMAL HIGH (ref 70–99)
Phosphorus: 7.3 mg/dL — ABNORMAL HIGH (ref 2.5–4.6)
Potassium: 4.7 mmol/L (ref 3.5–5.1)
Sodium: 138 mmol/L (ref 135–145)

## 2018-10-25 LAB — HEPARIN LEVEL (UNFRACTIONATED): Heparin Unfractionated: 0.22 IU/mL — ABNORMAL LOW (ref 0.30–0.70)

## 2018-10-25 LAB — GLUCOSE, CAPILLARY
Glucose-Capillary: 207 mg/dL — ABNORMAL HIGH (ref 70–99)
Glucose-Capillary: 190 mg/dL — ABNORMAL HIGH (ref 70–99)
Glucose-Capillary: 188 mg/dL — ABNORMAL HIGH (ref 70–99)

## 2018-10-25 LAB — APTT: aPTT: 54 seconds — ABNORMAL HIGH (ref 24–36)

## 2018-10-25 MED ORDER — SODIUM CHLORIDE 0.9 % IV BOLUS
250.0000 mL | Freq: Once | INTRAVENOUS | Status: AC
  Administered 2018-10-25: 05:00:00 250 mL via INTRAVENOUS

## 2018-10-28 NOTE — Progress Notes (Signed)
Discussed during board rounds with Dr. Dorothy Puffer and ICU staff.  The patient is rapidly deteriorating.  MODS, ARDS, ARF and cardiac arrest x2.  I do not believe patient will survive this admission and feel that DNR is perfectly appropriate.  Also do not think further escalation of care is in his best interest and should not be pursued.   Rush Farmer, M.D. [facility] Pulmonary/Critical Care Medicine. Pager: [phone]. After hours pager: [phone].

## 2018-10-28 NOTE — Progress Notes (Signed)
ANTICOAGULATION CONSULT NOTE - Follow Up Consult  Pharmacy Consult for Heparin Indication: Suspected PE, Bilateral DVTs, CRRT anticoagulation  No Known Allergies  Patient Measurements: Height: 5\' 3"  (160 cm) Weight: 184 lb 11.9 oz (83.8 kg) IBW/kg (Calculated) : 56.9 Heparin Dosing Weight: 79.5 kg  Vital Signs: Temp: 97.8 F (36.6 C) (09/28 0745) Temp Source: Axillary (09/28 0745) BP: 104/66 (09/28 0930) Pulse Rate: 99 (09/28 0930)  Labs: Recent Labs    10/23/18 0535  10/24/18 0420 10/24/18 1309 10/24/18 1628 10/24/18 1955 11/21/18 0249 11-21-18 0500  HGB 12.0*   < > 13.2 14.6  --   --  13.6 12.3*  HCT 39.4   < > 44.7 43.0  --   --  40.0 42.5  PLT 177  --  183  --   --   --   --  119*  APTT 62*  --  58*  --   --   --   --  54*  HEPARINUNFRC 0.33  --  0.25*  --   --  0.28*  --  0.22*  CREATININE  --    < > 1.99*  --  2.06*  --   --  2.04*   < > = values in this interval not displayed.    Estimated Creatinine Clearance: 36.4 mL/min (A) (by C-G formula based on SCr of 2.04 mg/dL (H)).  Medications: Infusions:  .  prismasol BGK 4/2.5 400 mL/hr at 11/21/2018 0645  .  prismasol BGK 4/2.5 200 mL/hr at 10/24/18 1808  . sodium chloride 10 mL/hr at 21-Nov-2018 0900  . feeding supplement (VITAL 1.5 CAL) 1,000 mL (10/24/18 1600)  . fentaNYL infusion INTRAVENOUS Stopped (10/24/18 1210)  . heparin 1,600 Units/hr (11-21-2018 0900)  . midazolam Stopped (10/24/18 1210)  . norepinephrine (LEVOPHED) Adult infusion 33 mcg/min (11-21-18 0900)  . prismasol BGK 0/2.5 1,500 mL/hr at 11-21-18 0846  .  sodium bicarbonate (isotonic) infusion in sterile water 75 mL/hr at 11/21/18 0900  . vasopressin (PITRESSIN) infusion - *FOR SHOCK* 0.03 Units/min (2018-11-21 0900)    Assessment: Thomas Gill is a 63 y.o. male presented on 9/14 with worsening O2 saturation, cough, diarrhea, COVID-19 positive.  On 9/17, patient decompensated requiring intubation. On 9/18 patient had acardiorespiratory arrest/code  blueevent that lasted approximately 9 minutes until achieving ROSC.  9/18 Vascular US positive for bilateral DVTs.  Pharmacy is consulted to dose heparin for DVT and suspected PE.  Heparin was held briefly last night for chest tube placement. It was then resumed. Level came back slightly below goal last night. Heparin was increased to 1600 units/hr. This AM heparin level remains sub-therapeutic at 0.22. Hbg decreased to 12.3 from 13.6. Plts dropped from 183 to 119.   Goal of Therapy:  Heparin level 0.3-0.5 units/ml Monitor platelets by anticoagulation protocol: Yes   Plan:  Increase heparin gtt to 1750 units/hr  - 6 hr heparin level Monitor daily heparin level, CBC, s/s of bleed  Acey Lav, PharmD  PGY1 Acute Care Pharmacy Resident Nov 21, 2018 9:48 AM

## 2018-10-28 NOTE — Progress Notes (Signed)
[facility] TEAM 1 - Stepdown/ICU TEAM  Thomas Gill  XKG:[id number] DOB: [birth year] DOA: 10/14/2018 PCP: Patient, No Pcp Per    Brief Narrative:  62 y.o.malediagnosed with COVID-19 10/06/2018 who presented to the ED with progressive shortness of breath, cough and diarrhea. When EMS arrived at his house he was satting in the 60s on room air.  Patient was admitted to the ICU and intubated within 24 hours of arrival. Hospital course complicated by cardiopulmonary arrest on 9/17 followed by AKI now requiring CRRT.  Significant Events: 9/14 admitted 9/15 intubated 9/17 cardiopulmonary arrest, asystole, ROSC in 9 minutes per chart 9/17 femoral line 9/18 DVT found, started on heparin 9/20 DC femoral line, insert left IJ 9/20 bronchoscopy for mucous plugging 9/21 hemodialysis catheter placement, start CRRT 9/24 spontaneous PTX - s/p R chest tube   COVID-19 specific Treatment: Remdesivir 9/14 > 9/18 Decadron 9/14 > 9/25  Subjective: Continues to decline. In absence of sedation is entirely unresponsive w/ no corneal reflexes and no gag. Is actively dying. No evidence of uncontrolled pain or anxiety.   Assessment & Plan:  COVID Pneumonia - ARDS - Acute hypoxic respiratory failure Vent management per PCCM - has completed a course of Remdesivir and steroids - continues to decline   Acute R tension PTX -recurrent today Noted on rounds 9/24 - s/p pigtail chest tube initially - recurrent 9/27 requiring placement of full size chest tube  Cardiac arrest - asystole 9/17 ROSC in 9 mins after epi x3 - ?due to PE  Proteus and Staph aureus tracheobronchitis/pneumonia Focused antibiotic tx continues - follow clinically  Hemoptysis Felt to be due to PE - appears to have resolved   Acute DVT Noted on venous doppler 9/18 - on heparin anticoag  Acute kidney failure  Felt to be ATN due to cardiac arrest - cont CRRT - Nephrology following   Transaminitis  Likely due to COVID + shock liver -  follow  DM2 CBG remains poorly controlled - tx adjusted again today - follow response  DVT prophylaxis: IV heparin Code Status: DNR - pt is actively dying - feel that further escalation of care is not appropriate - monitor closely for signs of pain or anxiety, with a low threshold to transition to comfort care if evidence of pain or anxiety is present  Family Communication:  Disposition Plan: ICU  Consultants:  PCCM  Antimicrobials:  Cefepime 9/21 > 9/25 Cefazolin 9/25 > Vancomycin 9/21 > 9/22  Objective: Blood pressure (!) 60/48, pulse 96, temperature 97.7 F (36.5 C), temperature source Axillary, resp. rate (!) 35, height _0  (1.6 m), weight 83.8 kg, SpO2 93 %.  Intake/Output Summary (Last 24 hours) at 11-22-18 1549 Last data filed at November 22, 2018 1500 Gross per 24 hour  Intake 5469.11 ml  Output 4517 ml  Net 952.11 ml   Filed Weights   10/23/18 0500 10/24/18 0500 Nov 22, 2018 0500  Weight: 90.5 kg 84.7 kg 83.8 kg    Examination: General: Sedated on ventilator  Lungs: B crackles  Cardiovascular: RRR  Abdomen: soft, not distended  Extremities: trace B LE edema   CBC: Recent Labs  Lab 10/23/18 0535  10/24/18 0420 10/24/18 1309 22-Nov-2018 0249 11-22-2018 0500  WBC 18.5*  --  21.5*  --   --  20.9*  HGB 12.0*   < > 13.2 14.6 13.6 12.3*  HCT 39.4   < > 44.7 43.0 40.0 42.5  MCV 99.2  --  102.5*  --   --  103.2*  PLT 177  --  183  --   --  119*   < > = values in this interval not displayed.   Basic Metabolic Panel: Recent Labs  Lab 10/23/18 0535  10/24/18 0420  10/24/18 1628 2018-11-16 0249 2018/11/16 0500  NA  --    < > 137   < > 139 135 138  K  --    < > 5.2*   < > 5.1 4.7 4.7  CL  --    < > 100  --  103  --  97*  CO2  --    < > 22  --  19*  --  19*  GLUCOSE  --    < > 243*  --  181*  --  202*  BUN  --    < > 56*  --  49*  --  55*  CREATININE  --    < > 1.99*  --  2.06*  --  2.04*  CALCIUM  --    < > 8.6*  --  7.6*  --  8.1*  MG 2.8*  --  3.2*  --   --   --   3.3*  PHOS  --    < > 5.8*  --  7.0*  --  7.3*   < > = values in this interval not displayed.   GFR: Estimated Creatinine Clearance: 36.4 mL/min (A) (by C-G formula based on SCr of 2.04 mg/dL (H)).  Liver Function Tests: Recent Labs  Lab 10/22/18 0435  10/23/18 0535  10/23/18 1625 10/24/18 0420 10/24/18 1628 2018/11/16 0500  AST 96*  --  54*  --   --   --   --   --   ALT 112*  --  85*  --   --   --   --   --   ALKPHOS 147*  --  148*  --   --   --   --   --   BILITOT 0.5  --  0.5  --   --   --   --   --   PROT 7.5  --  7.8  --   --   --   --   --   ALBUMIN 2.0*  2.0*   < > 2.1*   < > 2.2* 2.1* 2.3* 2.1*   < > = values in this interval not displayed.    HbA1C: Hgb A1c MFr Bld  Date/Time Value Ref Range Status  10/23/2018 02:30 PM 9.1 (H) 4.8 - 5.6 % Final    Comment:    (NOTE)         Prediabetes: 5.7 - 6.4         Diabetes: >6.4         Glycemic control for adults with diabetes: <7.0     CBG: Recent Labs  Lab 10/24/18 1954 10/24/18 2335 November 16, 2018 0429 November 16, 2018 0728 11/16/18 1132  GLUCAP 276* 276* 207* 190* 188*    Recent Results (from the past 240 hour(s))  Culture, bal-quantitative     Status: Abnormal   Collection Time: 10/17/18 11:37 AM   Specimen: Bronchoalveolar Lavage; Respiratory  Result Value Ref Range Status   Specimen Description   Final    BRONCHIAL ALVEOLAR LAVAGE Performed at [facility] [address], [place], [place] [address]    Special Requests   Final    NONE Performed at [facility], [place] [address], [place], [place] [address]    Gram Stain  Final    FEW WBC PRESENT, PREDOMINANTLY MONONUCLEAR MODERATE GRAM POSITIVE COCCI FEW GRAM NEGATIVE COCCI RARE GRAM NEGATIVE RODS Performed at [facility], [place] [address], [place], [place] [address]    Culture (A)  Final    >=100,000 COLONIES/mL PROTEUS MIRABILIS >=100,000 COLONIES/mL STAPHYLOCOCCUS AUREUS    Report Status 10/22/2018 FINAL   Final   Organism ID, Bacteria PROTEUS MIRABILIS (A)  Final   Organism ID, Bacteria STAPHYLOCOCCUS AUREUS (A)  Final      Susceptibility   Proteus mirabilis - MIC*    AMPICILLIN <=2 SENSITIVE Sensitive     CEFAZOLIN <=4 SENSITIVE Sensitive     CEFEPIME <=1 SENSITIVE Sensitive     CEFTAZIDIME <=1 SENSITIVE Sensitive     CEFTRIAXONE <=1 SENSITIVE Sensitive     CIPROFLOXACIN <=0.25 SENSITIVE Sensitive     GENTAMICIN <=1 SENSITIVE Sensitive     IMIPENEM 1 SENSITIVE Sensitive     TRIMETH/SULFA <=20 SENSITIVE Sensitive     AMPICILLIN/SULBACTAM <=2 SENSITIVE Sensitive     PIP/TAZO <=4 SENSITIVE Sensitive     * >=100,000 COLONIES/mL PROTEUS MIRABILIS   Staphylococcus aureus - MIC*    CIPROFLOXACIN <=0.5 SENSITIVE Sensitive     ERYTHROMYCIN >=8 RESISTANT Resistant     GENTAMICIN <=0.5 SENSITIVE Sensitive     OXACILLIN <=0.25 SENSITIVE Sensitive     TETRACYCLINE <=1 SENSITIVE Sensitive     VANCOMYCIN 1 SENSITIVE Sensitive     TRIMETH/SULFA <=10 SENSITIVE Sensitive     CLINDAMYCIN RESISTANT Resistant     RIFAMPIN <=0.5 SENSITIVE Sensitive     Inducible Clindamycin POSITIVE Resistant     * >=100,000 COLONIES/mL STAPHYLOCOCCUS AUREUS  Acid Fast Smear (AFB)     Status: None   Collection Time: 10/17/18 11:38 AM   Specimen: Bronchial Alveolar Lavage; Respiratory  Result Value Ref Range Status   AFB Specimen Processing Concentration  Final   Acid Fast Smear Negative  Final    Comment: (NOTE) Performed At: [facility] [place], [place] [id number] Rush Farmer MD EL:[id number]    Source (AFB) BRONCHIAL ALVEOLAR LAVAGE  Final    Comment: Performed at [facility] [address] [place], [place] [address]  Fungus Culture With Stain     Status: None (Preliminary result)   Collection Time: 10/17/18 11:38 AM  Result Value Ref Range Status   Fungus Stain Final report  Final    Comment: (NOTE) Performed At: [facility] [place], [place] [id number] Rush Farmer MD RW:[id number]    Fungus (Mycology) Culture PENDING  Incomplete   Fungal Source BRONCHIAL ALVEOLAR LAVAGE  Final    Comment: Performed at [facility], [place] [address], [place], [place] [address]  Fungus Culture Result     Status: None   Collection Time: 10/17/18 11:38 AM  Result Value Ref Range Status   Result 1 Comment  Final    Comment: (NOTE) KOH/Calcofluor preparation:  no fungus observed. Performed At: [facility] [place], [place] [id number] Rush Farmer MD YK:[id number]   MRSA PCR Screening     Status: None   Collection Time: 10/18/18 11:48 AM   Specimen: Nasal Mucosa; Nasopharyngeal  Result Value Ref Range Status   MRSA by PCR NEGATIVE NEGATIVE Final    Comment:        The GeneXpert MRSA Assay (FDA approved for NASAL specimens only), is one component of a comprehensive MRSA colonization surveillance program. It is not intended to diagnose MRSA infection  nor to guide or monitor treatment for MRSA infections. Performed at [facility], [place] [address], [place], [place] [address]      Scheduled Meds: . albuterol  2.5 mg Nebulization Q6H  . artificial tears  1 application Both Eyes K3T  . chlorhexidine gluconate (MEDLINE KIT)  15 mL Mouth Rinse BID  . Chlorhexidine Gluconate Cloth  6 each Topical Daily  . famotidine  20 mg Per Tube QHS  . feeding supplement (PRO-STAT SUGAR FREE 64)  60 mL Per Tube TID  . guaiFENesin  15 mL Oral Q6H  . hydrocortisone sod succinate (SOLU-CORTEF) inj  50 mg Intravenous Q8H  . insulin aspart  0-20 Units Subcutaneous Q4H  . insulin aspart  10 Units Subcutaneous Q4H  . insulin detemir  42 Units Subcutaneous BID  . mouth rinse  15 mL Mouth Rinse 10 times per day  . multivitamin with minerals  1 tablet Per Tube Daily  . oxyCODONE  5 mg Per Tube Q6H  . polyethylene glycol  17 g Per Tube BID  . sennosides  5 mL Per Tube BID  . sodium  chloride flush  10-40 mL Intracatheter Q12H   Continuous Infusions: .  prismasol BGK 4/2.5 400 mL/hr at 21-Nov-2018 0645  .  prismasol BGK 4/2.5 200 mL/hr at 10/24/18 1808  . sodium chloride 10 mL/hr at 2018-11-21 1500  . feeding supplement (VITAL 1.5 CAL) 1,000 mL (10/24/18 1600)  . fentaNYL infusion INTRAVENOUS Stopped (10/24/18 1210)  . heparin 1,750 Units/hr (21-Nov-2018 1500)  . midazolam Stopped (10/24/18 1210)  . norepinephrine (LEVOPHED) Adult infusion 50 mcg/min (11-21-2018 1500)  . prismasol BGK 0/2.5 1,500 mL/hr at 21-Nov-2018 1156  .  sodium bicarbonate (isotonic) infusion in sterile water 75 mL/hr at 11/21/18 1500  . vasopressin (PITRESSIN) infusion - *FOR SHOCK* 0.03 Units/min (11/21/2018 1500)     LOS: 14 days   Cherene Altes, MD [facility] Office  [phone] Pager - Text Page per Amion  If 7PM-7AM, please contact night-coverage per Amion 11-21-18, 3:49 PM

## 2018-10-28 NOTE — Progress Notes (Signed)
Facetimed with daughter and other family to see and talk to patient at 61. His heartrate began to decrease as they talked to and sang him happy birthday, daughter notified with assistance of interpreter that patient was nearing death. She was on the phone at the time of his passing (1550) and made aware by charge nurse via [place] interpreter. Daughter called back around 05-17-48 for confirmation of death, asked about funeral home and family would like the body to be sent back to [place].

## 2018-10-28 NOTE — Progress Notes (Signed)
[facility] Progress Note Patient Name: Thomas Gill DOB: 1955-09-02 MRN: [id number]   Date of Service  2018/11/10  HPI/Events of Note  Notified of hypotension/labile BP. On bicarbonate drip. Ongoing CRRT. CVP 6.  eICU Interventions  CRRT to zero net Bolus 250 cc NS bolus     Intervention Category Major Interventions: Hypotension - evaluation and management  Thomas Gill Thomas Gill 11-10-18, 5:09 AM

## 2018-10-28 NOTE — Progress Notes (Signed)
NAMEMcdonald Gill, MRN:  [id number], DOB:  25-Nov-1955, LOS: 31 ADMISSION DATE:  09/28/2018, CONSULTATION DATE:  9/15 REFERRING MD:  Thomas Gill, CHIEF COMPLAINT:  Dyspnea   Brief History   63 y/o male with DM2 admitted with COVID Pneumonia causing severe acute respiratory failure with hypoxemia.  Intubated on 1/01 Course complicated by cardiac arrest, then ATN requiring CRRT, mucous plugging of left lung and right pneumothorax  Past Medical History  Former smoker DM2 Obesity  Significant Hospital Events   9/14 admission 9/17 intubated, proned.  Cardiac arrest later in day while he was in prone position.  Central line placed.  Started on Levophed drip 9/18- Paralyzed for vent dysynchrony 9/19- Started heparin for DVT 9/20-bronchoscopy for mucous plugging, left hemithorax opacification 9/21-starting CVVH, restart proning 9/24 R pneumothorax  Consults:  PCCM Nephrology  Procedures:  ETT 9/17>>> Femoral CVL 9/17 >> 9/20 Lt IJ CVL 9/20 >> Rt fem a line 9/21 >> Rt IJ HD cath 9/25 >> Right chest tube 9/24 >>  Significant Diagnostic Tests:  Lower extremity duplex 9/19> age indeterminate bilateral DVT  Micro Data:  9/14 SARS COV 2 positive BAL 9/20 >> proteus, MSSA BAL 9/20 AFB, Fungus > neg  Antimicrobials/COVID Rx  9/14 Decadron >  9/14 Remdesivir >  9/18  Vancomycin 9/21> 9/22 Cefepime 9/21>9/25 Ancef 9/25 >>  Interim history/subjective:   No events overnight, no new complaints Remains hypotensive on pressors  Objective   Blood pressure 94/73, pulse 97, temperature 97.8 F (36.6 C), temperature source Axillary, resp. rate (!) 36, height _0  (1.6 m), weight 83.8 kg, SpO2 100 %. CVP:  [2 mmHg-11 mmHg] 9 mmHg  Vent Mode: PRVC FiO2 (%):  [50 %-100 %] 50 % Set Rate:  [30 bmp-35 bmp] 35 bmp Vt Set:  [450 mL] 450 mL PEEP:  [14 cmH20] 14 cmH20 Plateau Pressure:  [26 cmH20-33 cmH20] 30 cmH20   Intake/Output Summary (Last 24 hours) at October 31, 2018 1055 Last data filed  at 10/31/18 1000 Gross per 24 hour  Intake 5942.13 ml  Output 4728 ml  Net 1214.13 ml   Filed Weights   10/23/18 0500 10/24/18 0500 10-31-2018 0500  Weight: 90.5 kg 84.7 kg 83.8 kg   Examination:  General: Acutely ill appearing male HENT: [place]/AT, PERRL, EOM-I and MMM, ETT in place PULM: Coarse BS diffusely CV: RRR, Nl S1/S2 and -M/R/G GI: Soft, NT, ND and +BS MSK: normal bulk and tone Neuro: sedated on Versed and fentanyl, RA SS -3  I reviewed CXR myself, ETT is in a good position and infiltrate noted  Labs show worsening leukocytosis, mild hypokalemia and elevated sugars. ABG shows improving hypoxia, respiratory acidosis  Resolved Hospital Problem list     Assessment & Plan:  ARDS due to COVID 19 pneumonia Continue low tidal volume ventilation per ARDS protocol Target plateau less than 30 and driving pressure less than 15 Decrease respiratory rate to 30 due to severe auto PEEP Target PaO2 55-65: titrate PEEP/FiO2 per protocol As long as PaO2 to FiO2 ratio is less than 1:150 position in prone position for 16 hours a day Ventilator associated pneumonia prevention protocol Maintain on full vent support No weaning Anticipate will expire, now DNR  9/24 new right pneumothorax status post pigtail 9/27 pneumothorax appears worse, after flushing chest tube and milking purulent clot, much better air leak noted -Keep chest tube to suction -20 cm  Septic shock -increasing Levophed requirements  -Add vasopressin -Albumin 12.5 g x 1  AKI, oliguric CVVHD per renal Increasing  Levophed requirements, start to keep even  HCAP: proteus, MSSA - Ancef complete 7 days  Cardiac arrest, PE? Lower extremity DVT Heparin infusion  Acute encephalopathy Need for sedation for mechanical ventilation RASS target -4 to -5 with Versed and fentanyl drip Intermittent vecuronium protocol -At some point, would need neurological assessment prior to committing to trach  Shock liver -resolving   Uncontrolled hyperglycemia: Levemir/SSI per [facility]  Best practice:  Diet: tube feeding Pain/Anxiety/Delirium protocol (if indicated): as above VAP protocol (if indicated): yes DVT prophylaxis: heparin infusion GI prophylaxis: famotidine Glucose control: SSI Mobility: bed rest Code Status: full Family Communication:  nephew in [place] Thomas Gill [phone]) 9/25 ;  daughter, Thomas Gill [phone]) -last updated by nurse 9/26 Disposition:  ICU  The patient is critically ill with multiple organ systems failure and requires high complexity decision making for assessment and support, frequent evaluation and titration of therapies, application of advanced monitoring technologies and extensive interpretation of multiple databases.   Critical Care Time devoted to patient care services described in this note is  33  Minutes. This time reflects time of care of this signee Dr Thomas Gill. This critical care time does not reflect procedure time, or teaching time or supervisory time of PA/NP/Med student/Med Resident etc but could involve care discussion time.  Thomas Gill, M.D. [facility] Pulmonary/Critical Care Medicine. Pager: [phone]. After hours pager: [phone].

## 2018-10-28 NOTE — Progress Notes (Signed)
Pt has belongings in safe with security. Notified his daughter Obaloluwa Delatte via telephone with an interpretor and she gave consent for his friend William Hamburger to come and pick up his belongings from security. Attempted to contact Shanon Brow at this time and left his a VM.

## 2018-10-28 NOTE — Progress Notes (Signed)
Updated daughter over the phone with assistance of the interpreter. All questions were answered and she will call back later around 2-3pm to set up a video conference call to see the patient because it is his birthday today and the family wants to speak to him.

## 2018-10-28 NOTE — Progress Notes (Signed)
Patient's death pronounced by Athena Masse, RN and Elyn Peers, RN. No breath sounds or heart sounds auscultated.Dr.Yacoub notified.

## 2018-10-28 NOTE — Progress Notes (Signed)
[facility] NEPHROLOGY PROGRESS NOTE  Assessment/ Plan: Pt is a 63 y.o. yo male with a COVID-19 presented with shortness of breath, cough diarrhea.  He had cardiopulmonary arrest on 9/17 with ROSC 9 minutes.  He is intubated and now with worsening renal failure and hyperkalemia.  Problems:  #Nonoliguric AKI - likely ATN after cardiac arrest and COVID-19 infection. Ultrasound showed large kidneys without obstruction.  Started CRRT on 10/18/2018. Verbal consent obtained from family in [place] for catheter placement and dialysis treatment.   -Left IJ temporary catheter placed by PCCM on 9/21 - pt doing poorly, unresponsive and not sedated, sp arrest x 2 - keep even on CRRT - Getting systemic IV heparin at 1550u/ hr for anticoagulation for DVT  - getting IV bicarb gtt at 75 cc/hr  #Hyperkalemia: recurrent on 4K CRRT fluids. Changed dialysate back to 0K. Monitor lab  #COVID-19 pneumonia/ARDS/vent dependent respiratory failure:  High FiO2 with poor oxygenation. Cardiac arrest x 2.  MODS and ARDS.  Poor prognosis. Pt is now DNR.   #Cardiac arrest on 9/17: 9 minutes ROSC.  #DVT: IV heparin, per pulmonary.  #Elevated LFTs, shock liver.  Kelly Splinter, MD 14-Nov-2018, 3:36 PM     Subjective: Chart and lab results reviewed.   Exam:  Patient not examined directly given COVID-19 + status, utilizing exam of the primary team and observations of RN's.      Objective Vital signs in last 24 hours: Vitals:   Nov 14, 2018 1448 2018-11-14 1459 2018/11/14 1500 2018/11/14 1520  BP: (!) 89/63 (!) 60/48 (!) 74/60 (!) 60/48  Pulse: 99 98 97 96  Resp:  (!) 36  (!) 35  Temp:      TempSrc:      SpO2: 96% 94% 95% 93%  Weight:      Height:       Weight change: -0.886 kg  Intake/Output Summary (Last 24 hours) at November 14, 2018 1536 Last data filed at 2018/11/14 1500 Gross per 24 hour  Intake 5469.11 ml  Output 4517 ml  Net 952.11 ml       Labs: Basic Metabolic Panel: Recent Labs  Lab  10/24/18 0420  10/24/18 1628 2018/11/14 0249 2018/11/14 0500  NA 137   < > 139 135 138  K 5.2*   < > 5.1 4.7 4.7  CL 100  --  103  --  97*  CO2 22  --  19*  --  19*  GLUCOSE 243*  --  181*  --  202*  BUN 56*  --  49*  --  55*  CREATININE 1.99*  --  2.06*  --  2.04*  CALCIUM 8.6*  --  7.6*  --  8.1*  PHOS 5.8*  --  7.0*  --  7.3*   < > = values in this interval not displayed.   Liver Function Tests: Recent Labs  Lab 10/22/18 0435  10/23/18 0535  10/24/18 0420 10/24/18 1628 2018-11-14 0500  AST 96*  --  54*  --   --   --   --   ALT 112*  --  85*  --   --   --   --   ALKPHOS 147*  --  148*  --   --   --   --   BILITOT 0.5  --  0.5  --   --   --   --   PROT 7.5  --  7.8  --   --   --   --   ALBUMIN  2.0*  2.0*   < > 2.1*   < > 2.1* 2.3* 2.1*   < > = values in this interval not displayed.   No results for input(s): LIPASE, AMYLASE in the last 168 hours. No results for input(s): AMMONIA in the last 168 hours. CBC: Recent Labs  Lab 10/21/18 0530  10/22/18 0435  10/23/18 0535  10/24/18 0420 10/24/18 1309 Nov 22, 2018 0249 November 22, 2018 0500  WBC 16.1*  --  20.3*  --  18.5*  --  21.5*  --   --  20.9*  HGB 11.5*   < > 12.0*   < > 12.0*   < > 13.2 14.6 13.6 12.3*  HCT 37.7*   < > 40.1   < > 39.4   < > 44.7 43.0 40.0 42.5  MCV 100.3*  --  102.0*  --  99.2  --  102.5*  --   --  103.2*  PLT 177  --  180  --  177  --  183  --   --  119*   < > = values in this interval not displayed.   Cardiac Enzymes: No results for input(s): CKTOTAL, CKMB, CKMBINDEX, TROPONINI in the last 168 hours. CBG: Recent Labs  Lab 10/24/18 1954 10/24/18 2335 11/22/18 0429 11/22/18 0728 November 22, 2018 1132  GLUCAP 276* 276* 207* 190* 188*    Iron Studies: No results for input(s): IRON, TIBC, TRANSFERRIN, FERRITIN in the last 72 hours. Studies/Results: Dg Chest Port 1 View  Result Date: 11/22/2018 CLINICAL DATA:  Acute respiratory failure. EXAM: PORTABLE CHEST 1 VIEW COMPARISON:  10/24/2018. FINDINGS:  Endotracheal tube, feeding tube, bilateral IJ lines, 2 right chest tubes in stable position. Stable tiny right apical pneumothorax. Diffuse right lung infiltrate again noted, slight interim improvement. Persistent bibasilar atelectasis. No prominent pleural effusion. Heart size stable. IMPRESSION: 1. Lines and tubes including 2 right chest tubes in stable position. Stable tiny right apical pneumothorax. 2. Diffuse right lung infiltrate again noted, slight interim improvement. Persistent bibasilar atelectasis. Electronically Signed   By: Marcello Moores  Register   On: 11/22/2018 06:29   Dg Chest Port 1 View  Result Date: 10/24/2018 CLINICAL DATA:  Status post chest tube placement EXAM: PORTABLE CHEST 1 VIEW COMPARISON:  October 24, 2018 FINDINGS: Two right chest tubes are identified. A new right chest tube terminates near the right apex. A small right apical pneumothorax remains, particularly towards the base, much smaller in the interval. No left-sided pneumothorax. The ETT is in good position. The right and left central lines are in good position. A feeding tube terminates below today's film. Opacity remains diffusely throughout the right lung and in the left base. IMPRESSION: 1. Interval placement of a right-sided chest tube. The right-sided pneumothorax is much smaller in the interval with only a small pneumothorax remaining, particularly towards the base. 2. Other support apparatus as above. 3. Opacity remains throughout the right lung and in the left base. Recommend follow-up to resolution. Electronically Signed   By: Dorise Bullion III M.D   On: 10/24/2018 13:30   Dg Chest Port 1 View  Result Date: 10/24/2018 CLINICAL DATA:  Right pneumothorax. EXAM: PORTABLE CHEST 1 VIEW COMPARISON:  Radiograph of same day. FINDINGS: The heart size and mediastinal contours are within normal limits. Endotracheal and feeding tubes are unchanged in position. Bilateral jugular catheters are unchanged. Stable position of  right-sided chest tube is noted. There is large right pneumothorax present, with atelectasis of the right lung. Mild left basilar atelectasis is noted. The visualized skeletal  structures are unremarkable. IMPRESSION: Large right pneumothorax is noted which is significantly enlarged compared to prior exam. Right-sided chest tube is unchanged in position. Stable support apparatus. Electronically Signed   By: Marijo Conception M.D.   On: 10/24/2018 13:02   Dg Chest Port 1 View  Result Date: 10/24/2018 CLINICAL DATA:  Acute respiratory failure. EXAM: PORTABLE CHEST 1 VIEW COMPARISON:  Radiograph of October 23, 2018. FINDINGS: The heart size and mediastinal contours are within normal limits. Endotracheal and feeding tubes are unchanged in position. Bilateral jugular catheters are unchanged in position. Stable position of right-sided chest tube. Significantly increased right pneumothorax is noted. Small right pleural effusion is noted. Bibasilar opacities are again noted. The visualized skeletal structures are unremarkable. IMPRESSION: Moderate size right pneumothorax is noted which is significantly increased compared to prior exam. Stable position of right-sided chest tube. Stable bilateral lung opacities. Stable support apparatus. Electronically Signed   By: Marijo Conception M.D.   On: 10/24/2018 08:40    Medications: Infusions: .  prismasol BGK 4/2.5 400 mL/hr at November 10, 2018 0645  .  prismasol BGK 4/2.5 200 mL/hr at 10/24/18 1808  . sodium chloride 10 mL/hr at 2018-11-10 1500  . feeding supplement (VITAL 1.5 CAL) 1,000 mL (10/24/18 1600)  . fentaNYL infusion INTRAVENOUS Stopped (10/24/18 1210)  . heparin 1,750 Units/hr (2018/11/10 1500)  . midazolam Stopped (10/24/18 1210)  . norepinephrine (LEVOPHED) Adult infusion 50 mcg/min (2018-11-10 1500)  . prismasol BGK 0/2.5 1,500 mL/hr at 2018/11/10 1156  .  sodium bicarbonate (isotonic) infusion in sterile water 75 mL/hr at 2018/11/10 1500  . vasopressin (PITRESSIN)  infusion - *FOR SHOCK* 0.03 Units/min (Nov 10, 2018 1500)    Scheduled Medications: . albuterol  2.5 mg Nebulization Q6H  . artificial tears  1 application Both Eyes K8L  . chlorhexidine gluconate (MEDLINE KIT)  15 mL Mouth Rinse BID  . Chlorhexidine Gluconate Cloth  6 each Topical Daily  . famotidine  20 mg Per Tube QHS  . feeding supplement (PRO-STAT SUGAR FREE 64)  60 mL Per Tube TID  . guaiFENesin  15 mL Oral Q6H  . hydrocortisone sod succinate (SOLU-CORTEF) inj  50 mg Intravenous Q8H  . insulin aspart  0-20 Units Subcutaneous Q4H  . insulin aspart  10 Units Subcutaneous Q4H  . insulin detemir  42 Units Subcutaneous BID  . mouth rinse  15 mL Mouth Rinse 10 times per day  . multivitamin with minerals  1 tablet Per Tube Daily  . oxyCODONE  5 mg Per Tube Q6H  . polyethylene glycol  17 g Per Tube BID  . sennosides  5 mL Per Tube BID  . sodium chloride flush  10-40 mL Intracatheter Q12H    have reviewed scheduled and prn medications.  Sandy Salaam Koehn Salehi November 10, 2018,3:36 PM  LOS: 14 days

## 2018-10-28 NOTE — Progress Notes (Signed)
Patient's personal belongings in the safe given to the friend. Thomas Gill per family's request. Items to include wallet with over $400 and identification cards.

## 2018-10-28 DEATH — deceased

## 2018-11-17 LAB — FUNGAL ORGANISM REFLEX

## 2018-11-17 LAB — FUNGUS CULTURE RESULT

## 2018-11-17 LAB — FUNGUS CULTURE WITH STAIN

## 2018-11-28 NOTE — Discharge Summary (Signed)
   Death Summary   Tennyson Wacha ENI:[id number] DOB: [birth year] DOA: 10-15-18  PCP: Patient, No Pcp Per  Admit date: 2018-10-15 Date of Death: 10/29/2018  Final Diagnoses:  COVID Pneumonia ARDS Acute hypoxic respiratory failure Acute R tension PTX -recurrent  Cardiac arrest - asystole  Proteus and Staph aureus tracheobronchitis/pneumonia Hemoptysis Acute DVT - clinically suspected PE Acute kidney failure  Transaminitis  DM2  History of present illness:  62 y.o.malediagnosed with COVID-19 10-15-2018 who presented to the ED with progressive shortness of breath, cough and diarrhea. When EMS arrived at his house he was satting in the 60s on room air. Patient was admitted to the ICU and intubated within 24 hours of arrival. Hospital course complicated by cardiopulmonary arrest on 9/17 followed by AKI requiring CRRT.  Hospital Course:  October 15, 2022 admitted 9/15 intubated 9/17 cardiopulmonary arrest, asystole, ROSC in 9 minutes per chart 9/17 femoral line 9/18 DVT found, started on heparin 9/20 DC femoral line, insert left IJ 9/20 bronchoscopy for mucous plugging 9/21 hemodialysis catheter placement, start CRRT 9/24 spontaneous PTX - s/p R chest tube  10/29/2022 died in the ICU   The patient was a 63 year old gentleman who had been diagnosed as COVID positive approximately 1 week prior to his admission.  The health department called the patient to follow-up on him and ultimately sent an ambulance to his house to check on him.  When EMS arrived his saturations were in the 60s on room air.  He was transported emergently to the ED and ultimately required admission to the intensive care unit with intubation within 24 hours of arrival.  Aggressive measures were provided in an attempt to stabilize his course.  Unfortunately on 9/17 he suffered an asystolic cardiac arrest with 9 minutes passing before ROSC.  He remained in the ICU and vent dependent for the remainder of his hospital stay.  On 9/18 he was  found to have suffered with a DVT, and there was clinical evidence that he likely suffered a PE as well.  Post cardiac arrest the patient progressed to the point of acute renal failure and CRRT had to be initiated.  On 9/24 his course was further complicated by the development of a spontaneous tension pneumothorax, requiring placement of a pigtail chest tube.  This pneumothorax recurred on 9/27 and required upsizing of his pigtail chest tube to a full size chest tube.  Unfortunately the patient was never able to fully recover following his cardiac arrest.  It became clear that ongoing aggressive medical care was futile and inappropriate.  Physical exam suggested the likelihood of significant anoxic brain injury.  The patient's family understood the circumstances and agreed with the medical team that [place] was most appropriate.  On 928, while on a video conference call with his family in [place], the patient declined further and died.  Signed:  Cherene Altes  [facility] 11/04/2018, 6:10 PM

## 2018-12-01 LAB — ACID FAST CULTURE WITH REFLEXED SENSITIVITIES (MYCOBACTERIA): Acid Fast Culture: NEGATIVE

## 2021-04-02 IMAGING — DX DG CHEST 1V PORT
1 series · 1 of 1 positions shown · non-contrast
Comparison: October 24, 2018

CLINICAL DATA: Status post chest tube placement

EXAM:
PORTABLE CHEST 1 VIEW

[chest ap]
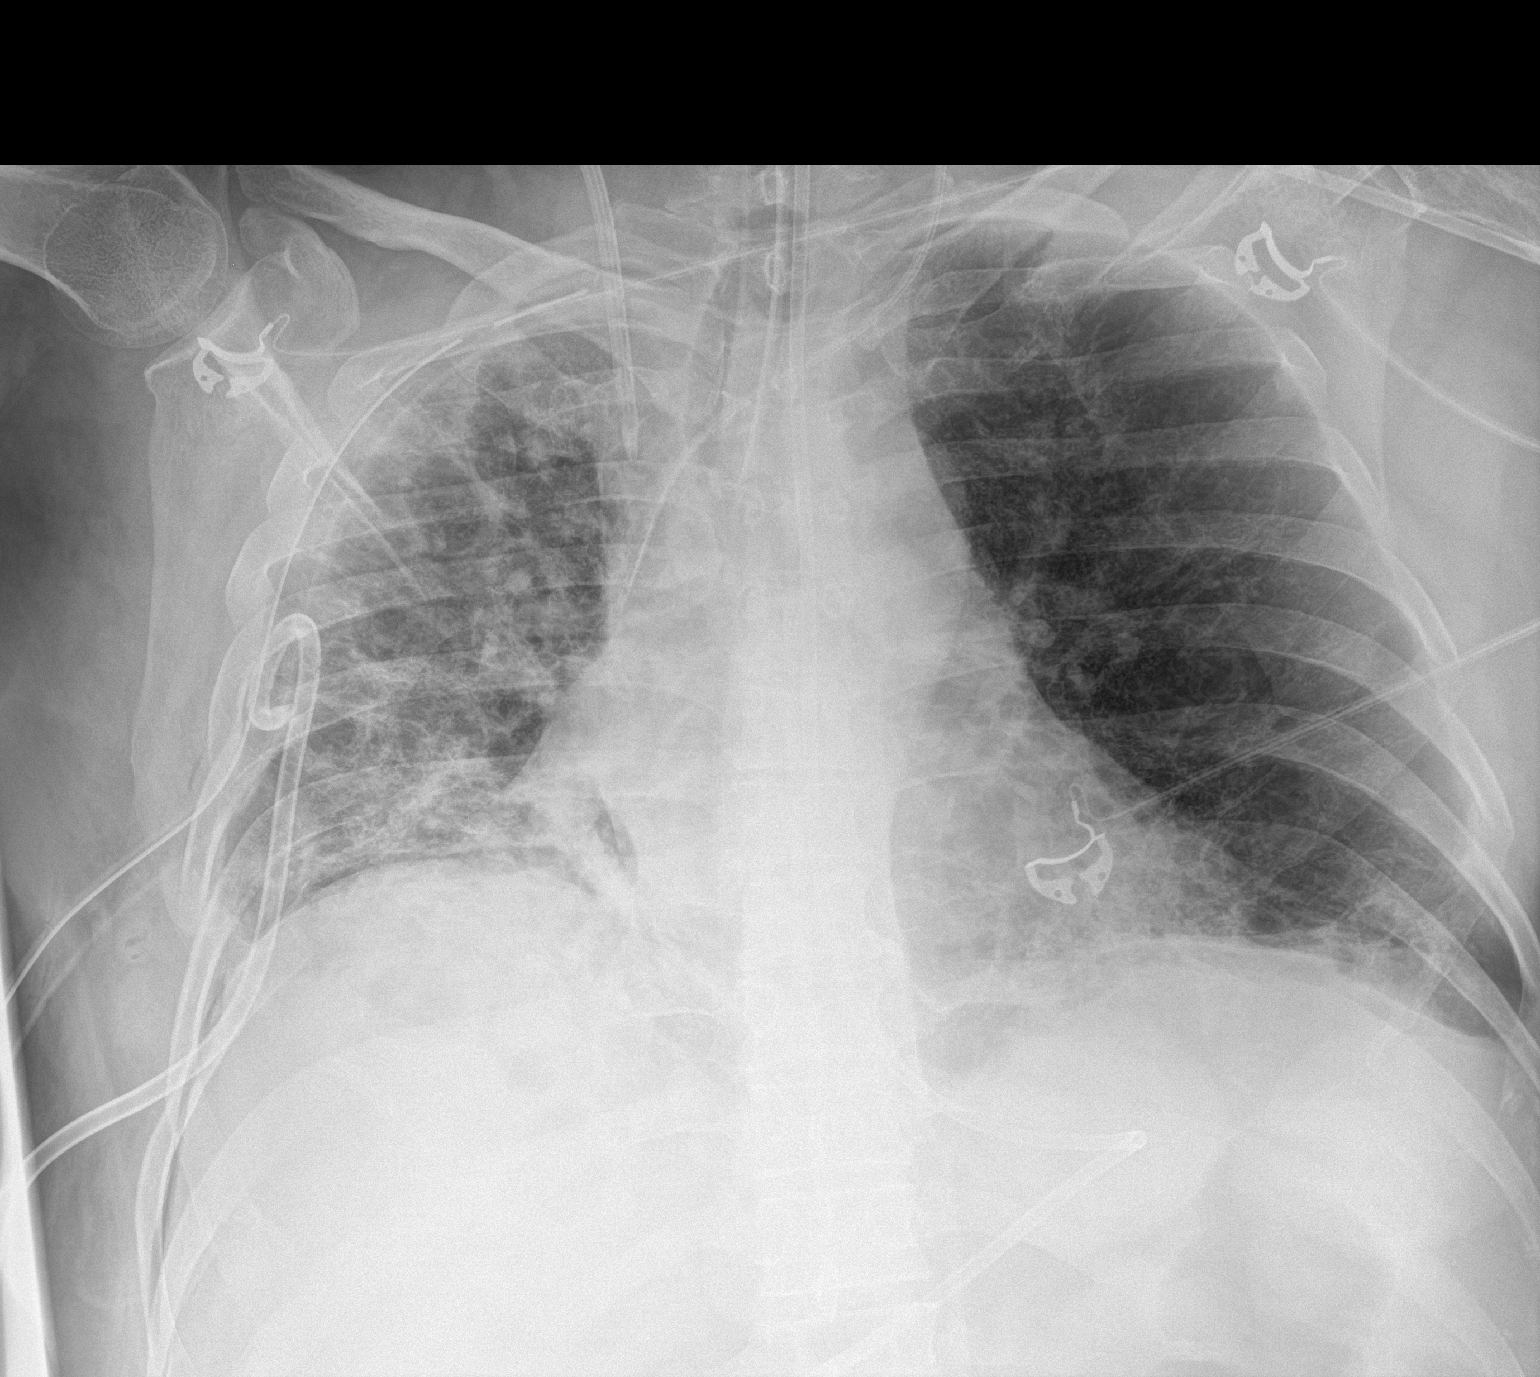

[1 of 1 positions shown; findings below may reference images not displayed]

FINDINGS: Two right chest tubes are identified. A new right chest tube
terminates near the right apex. A small right apical pneumothorax
remains, particularly towards the base, much smaller in the
interval. No left-sided pneumothorax. The ETT is in good position.
The right and left central lines are in good position. A feeding
tube terminates below today's film. Opacity remains diffusely
throughout the right lung and in the left base.
IMPRESSION: 1. Interval placement of a right-sided chest tube. The right-sided
pneumothorax is much smaller in the interval with only a small
pneumothorax remaining, particularly towards the base.
2. Other support apparatus as above.
3. Opacity remains throughout the right lung and in the left base.
Recommend follow-up to resolution.

## 2021-04-03 IMAGING — DX DG CHEST 1V PORT
1 series · 1 of 1 positions shown · non-contrast
Comparison: 10/24/2018.

CLINICAL DATA: Acute respiratory failure.

EXAM:
PORTABLE CHEST 1 VIEW

[chest ap]
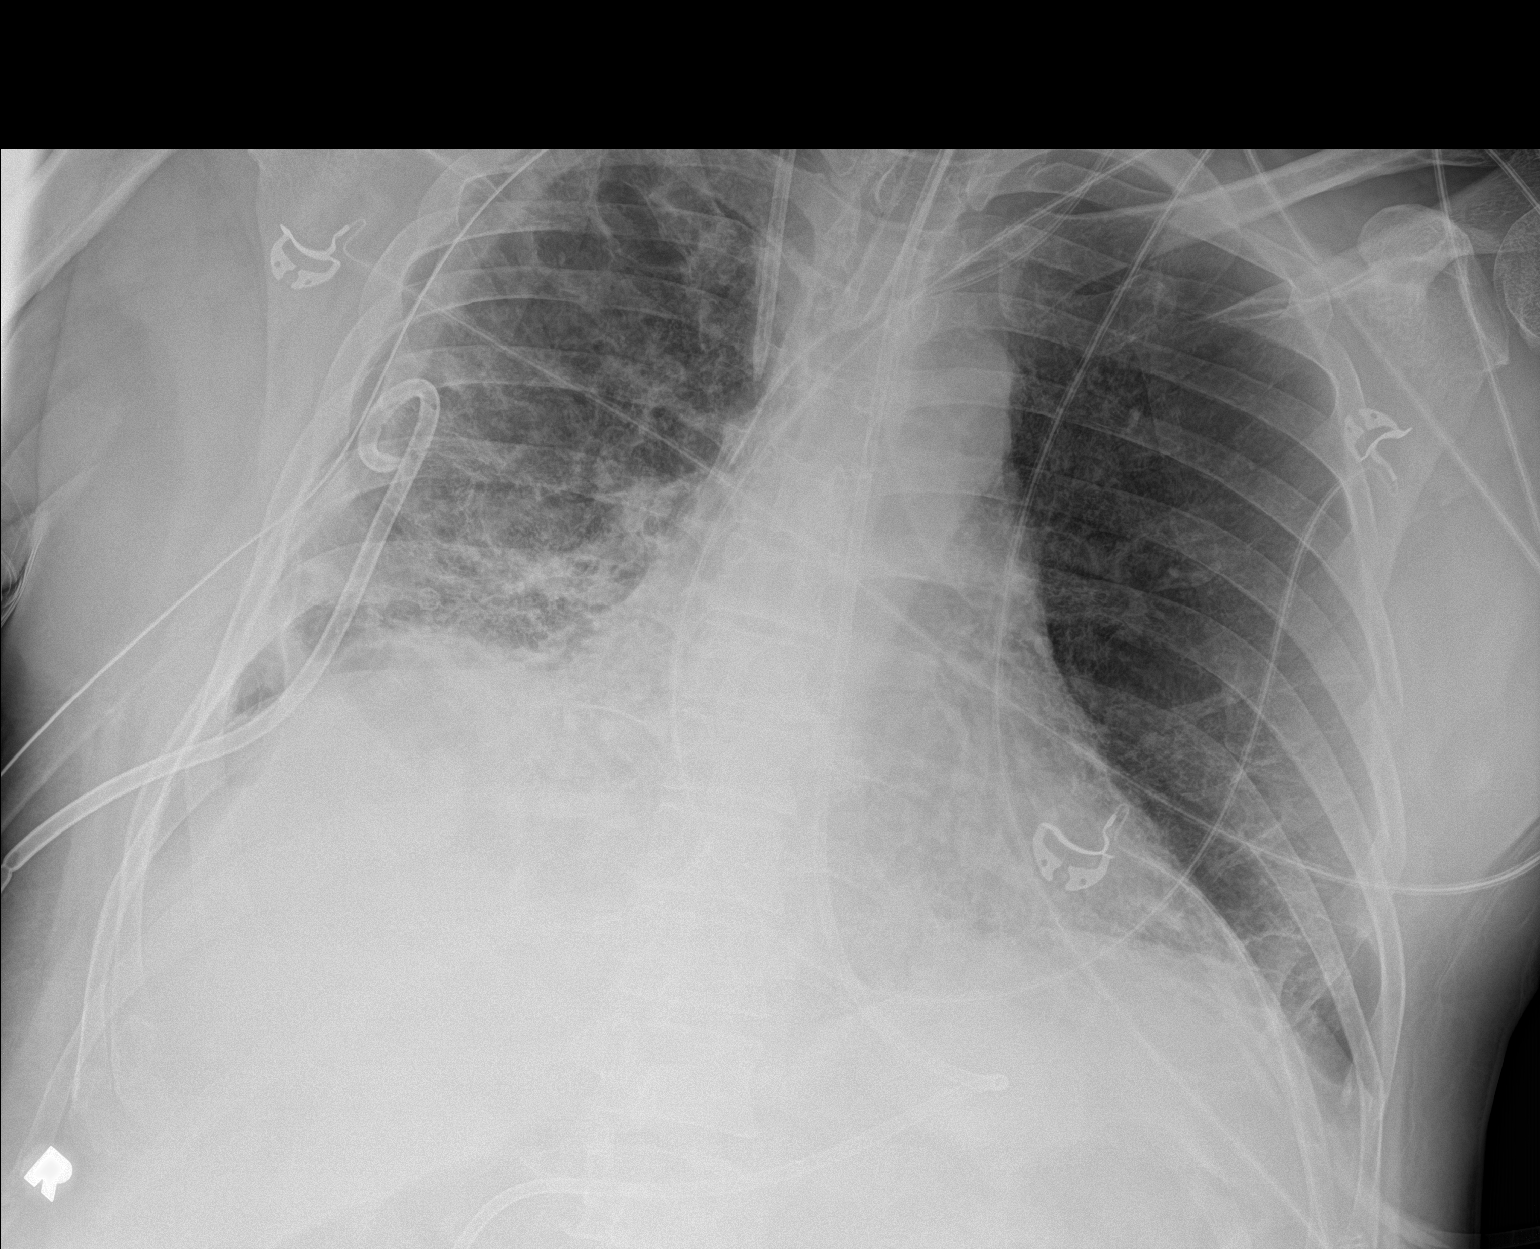

[1 of 1 positions shown; findings below may reference images not displayed]

FINDINGS: Endotracheal tube, feeding tube, bilateral IJ lines, 2 right chest
tubes in stable position. Stable tiny right apical pneumothorax.
Diffuse right lung infiltrate again noted, slight interim
improvement. Persistent bibasilar atelectasis. No prominent pleural
effusion. Heart size stable.
IMPRESSION: 1. Lines and tubes including 2 right chest tubes in stable position.
Stable tiny right apical pneumothorax.

2. Diffuse right lung infiltrate again noted, slight interim
improvement. Persistent bibasilar atelectasis.
